# Patient Record
Sex: Female | Born: 1955 | Hispanic: No | Marital: Married | Smoking: Never smoker
Health system: Southern US, Community
[De-identification: ages and names within clinical notes are randomized; demographics above are authoritative.]

## PROBLEM LIST (undated history)

## (undated) DIAGNOSIS — Z8679 Personal history of other diseases of the circulatory system: Secondary | ICD-10-CM

## (undated) DIAGNOSIS — F32A Depression, unspecified: Secondary | ICD-10-CM

## (undated) DIAGNOSIS — G3184 Mild cognitive impairment, so stated: Secondary | ICD-10-CM

## (undated) DIAGNOSIS — F419 Anxiety disorder, unspecified: Secondary | ICD-10-CM

## (undated) DIAGNOSIS — Z9889 Other specified postprocedural states: Secondary | ICD-10-CM

## (undated) HISTORY — PX: KNEE SURGERY: SHX244

## (undated) HISTORY — PX: ABDOMINAL HYSTERECTOMY: SHX81

---

## 2000-11-10 ENCOUNTER — Encounter: Payer: Self-pay | Admitting: Neurosurgery

## 2000-11-10 ENCOUNTER — Observation Stay (HOSPITAL_COMMUNITY): Admission: RE | Admit: 2000-11-10 | Discharge: 2000-11-11 | Payer: Self-pay | Admitting: Neurosurgery

## 2000-12-04 ENCOUNTER — Encounter: Admission: RE | Admit: 2000-12-04 | Discharge: 2000-12-04 | Payer: Self-pay | Admitting: Neurosurgery

## 2000-12-04 ENCOUNTER — Encounter: Payer: Self-pay | Admitting: Neurosurgery

## 2004-09-07 ENCOUNTER — Ambulatory Visit: Payer: Self-pay | Admitting: Family Medicine

## 2004-09-07 IMAGING — MG UNKNOWN MG STUDY
1 series · 4 of 4 positions shown · non-contrast
Comparison: none

REASON FOR EXAM: Screening
COMMENTS:

[R CC · right · 4 of 4 slices shown]
[im 1/4]
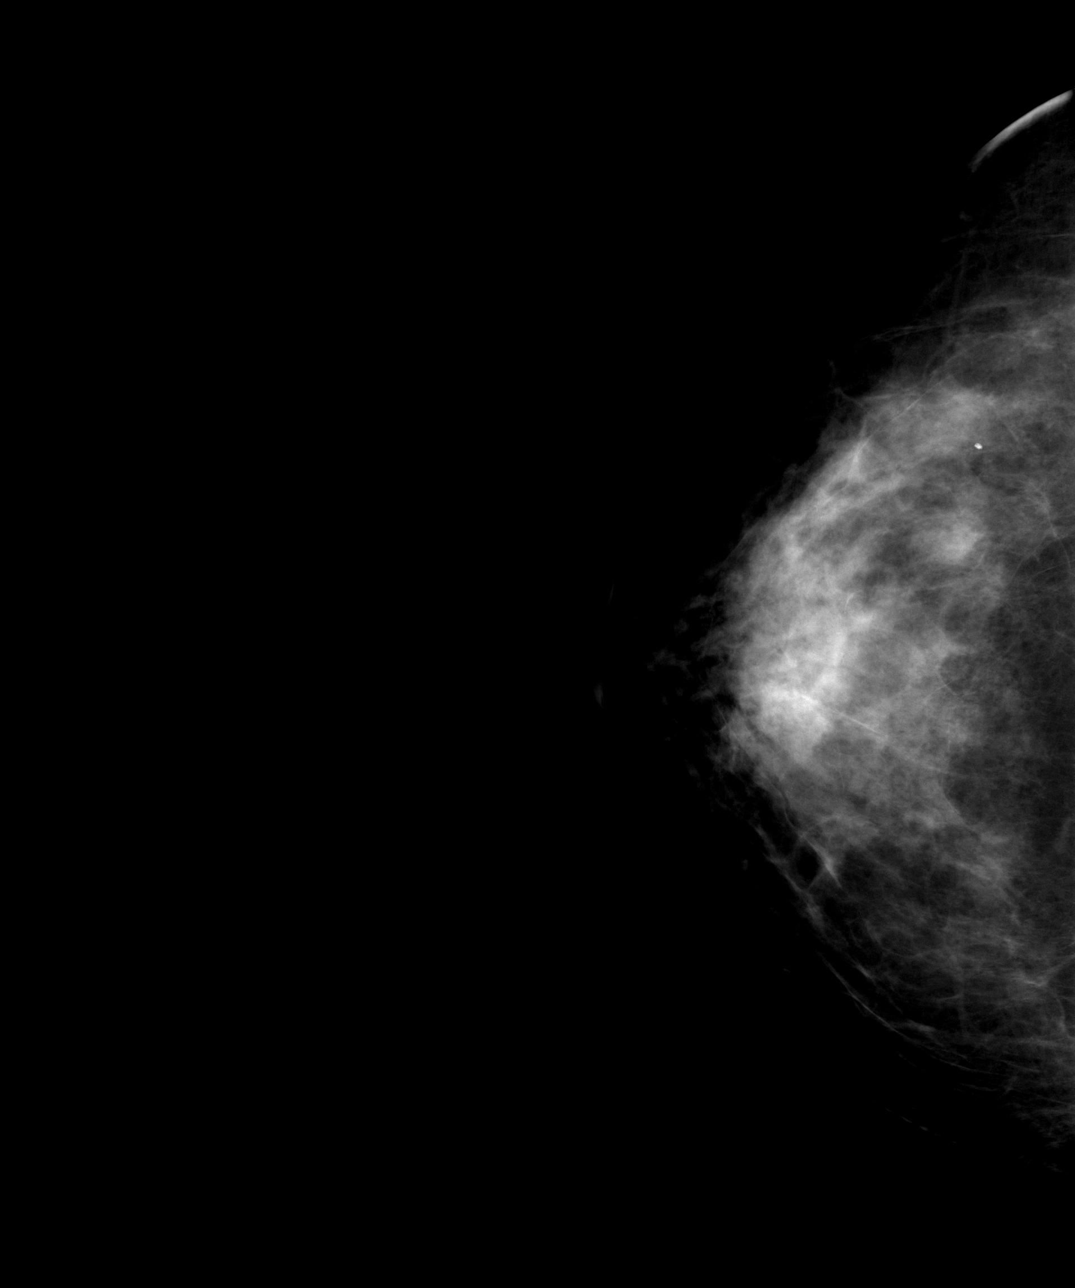
[im 2/4]
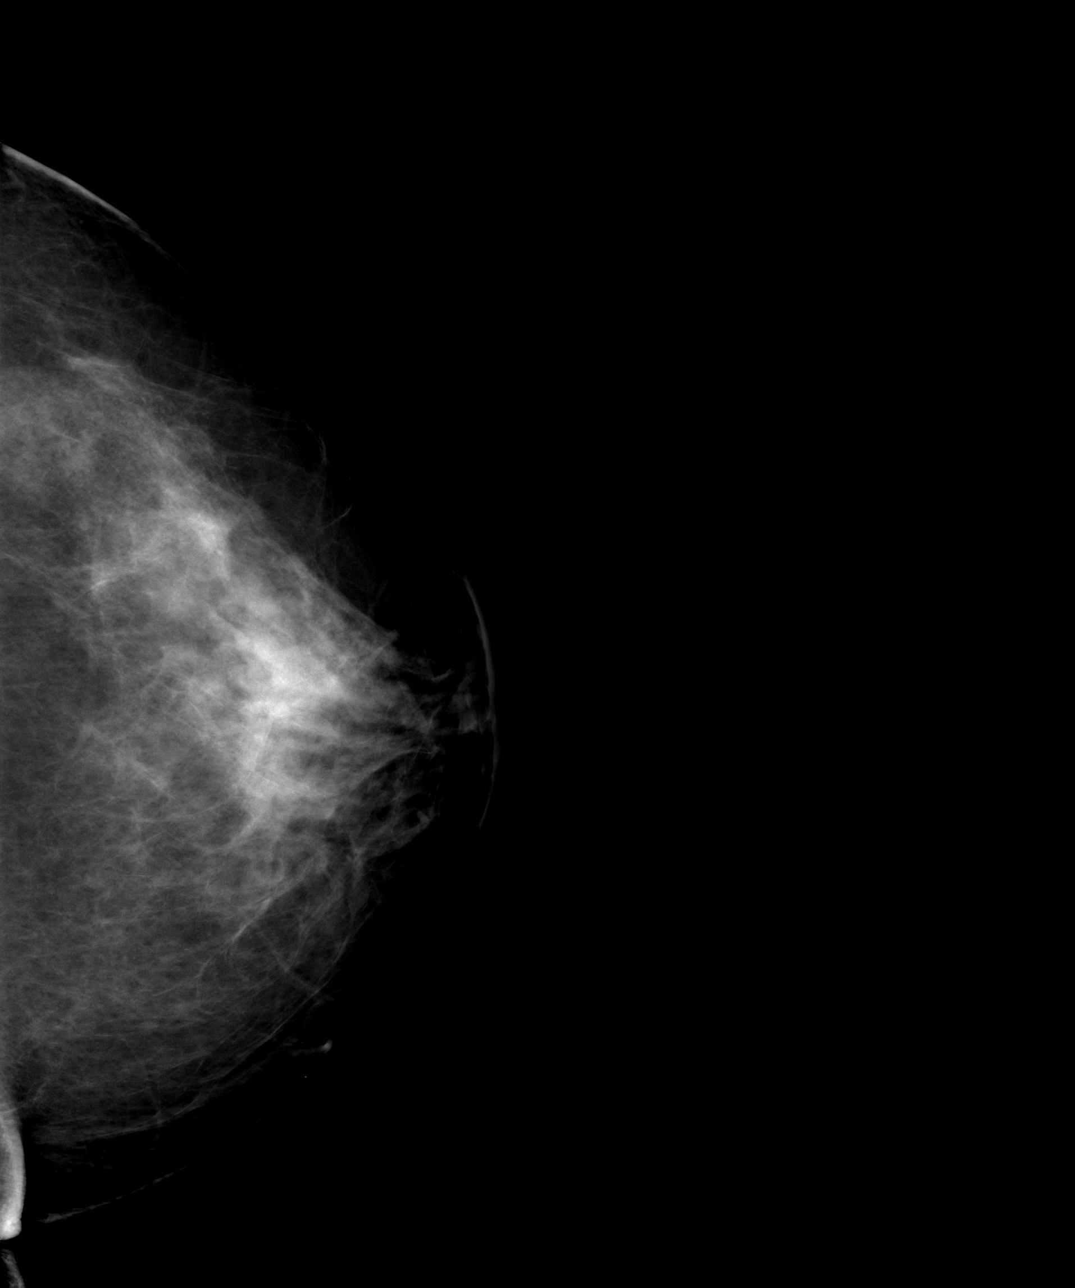
[im 3/4]
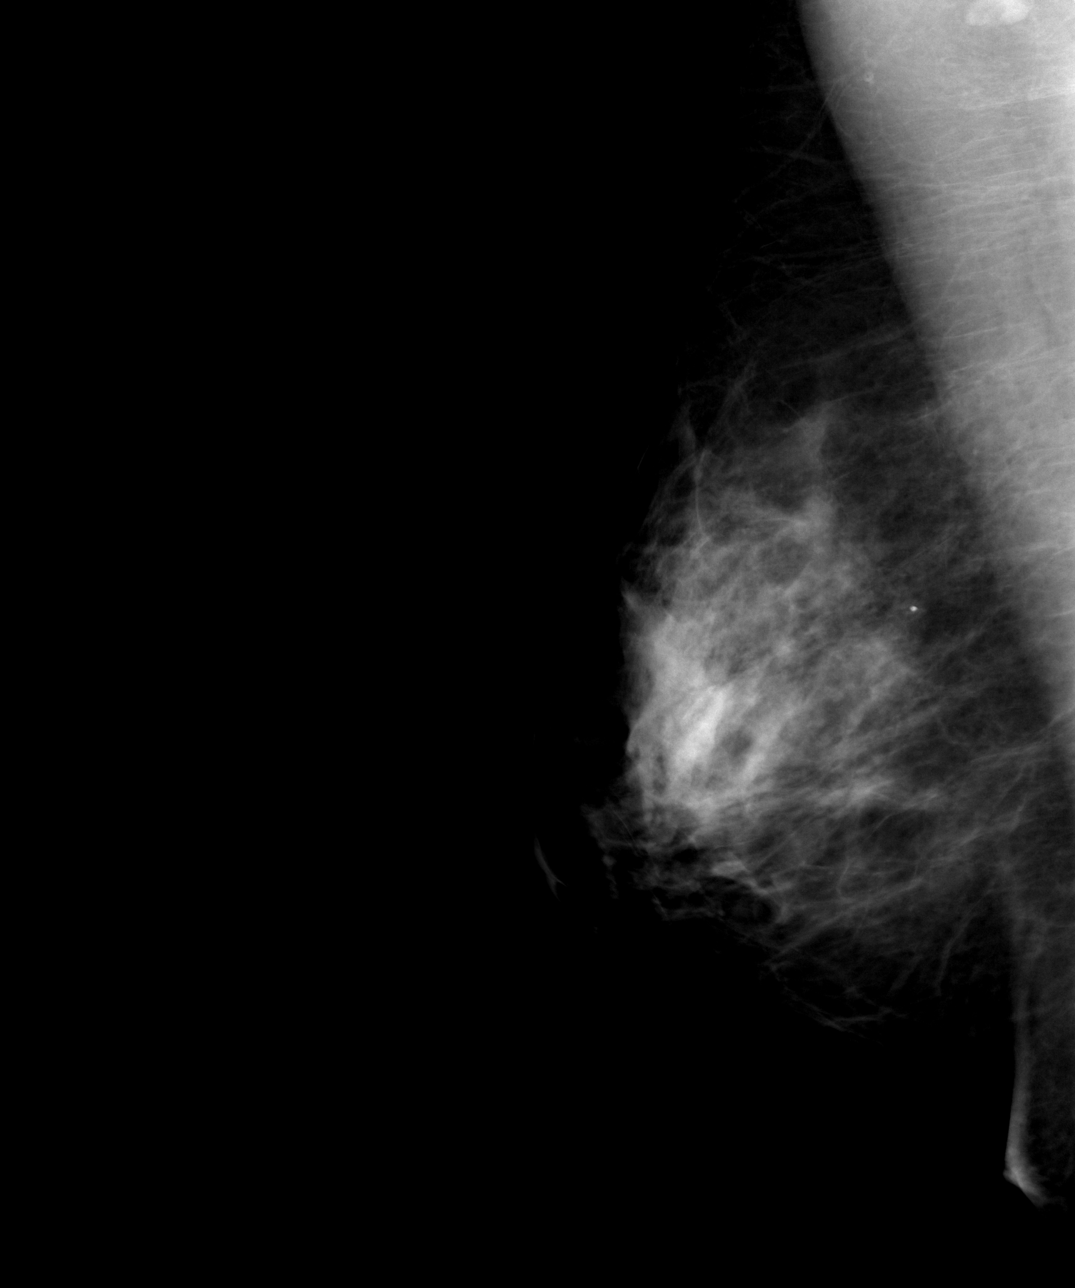
[im 4/4]
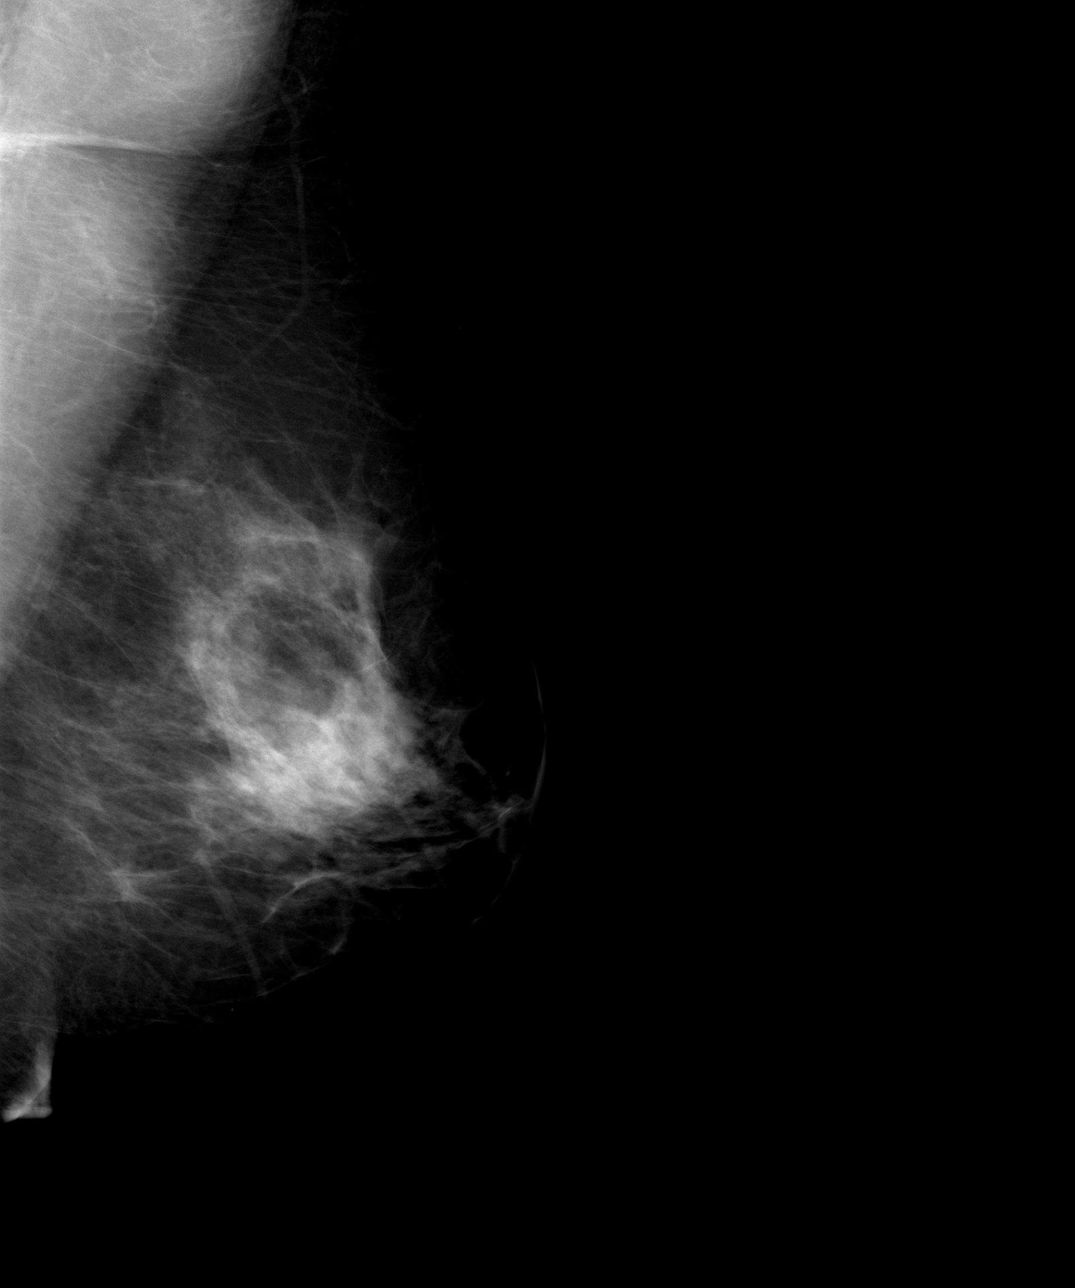

[4 of 4 positions shown; findings below may reference images not displayed]

PROCEDURE:     MAM - MAM DGTL SCREENING MAMMO W/CAD  - [DATE]  [DATE]

RESULT:     She is 48 with a strong family but no personal history of breast
cancer.  She does not take hormones or have complaints today.

The breasts are a mixture of dense fibroglandular tissue and fatty
replacement with the overall parenchymal pattern remaining stable.  There is
no evidence of a dominant stellate mass, architectural distortion, or
microcalcification to suspect malignancy.
IMPRESSION: Stable mammogram.  No change since prior studies.  The patient should return
for a bilateral screening mammogram in one year.

BI-RADS: Category 2-Benign Finding.

A NEGATIVE MAMMOGRAM REPORT DOES NOT PRECLUDE BIOPSY OR OTHER EVALUATION OF
A CLINICALLY PALPABLE OR OTHERWISE SUSPICIOUS MASS OR LESION.  BREAST CANCER
MAY NOT BE DETECTED BY MAMMOGRAPHY IN UP TO 10% OF CASES.

## 2006-02-23 ENCOUNTER — Ambulatory Visit: Payer: Self-pay | Admitting: Family Medicine

## 2007-08-09 ENCOUNTER — Ambulatory Visit: Payer: Self-pay | Admitting: Family Medicine

## 2007-08-16 ENCOUNTER — Ambulatory Visit: Payer: Self-pay | Admitting: Family Medicine

## 2007-08-16 IMAGING — MG MAM DGTL SCREENING MAMMO W/CAD
1 series · 4 of 4 positions shown · non-contrast
Comparison: none

REASON FOR EXAM: Screening mammogram
COMMENTS:

PROCEDURE:     MAM - MAM DGTL SCREENING MAMMO W/CAD  - [DATE]  [DATE]
RESULT:     Comparison is made to a prior exam of [DATE].

[R CC · right · 4 of 4 slices shown]
[im 1/4]
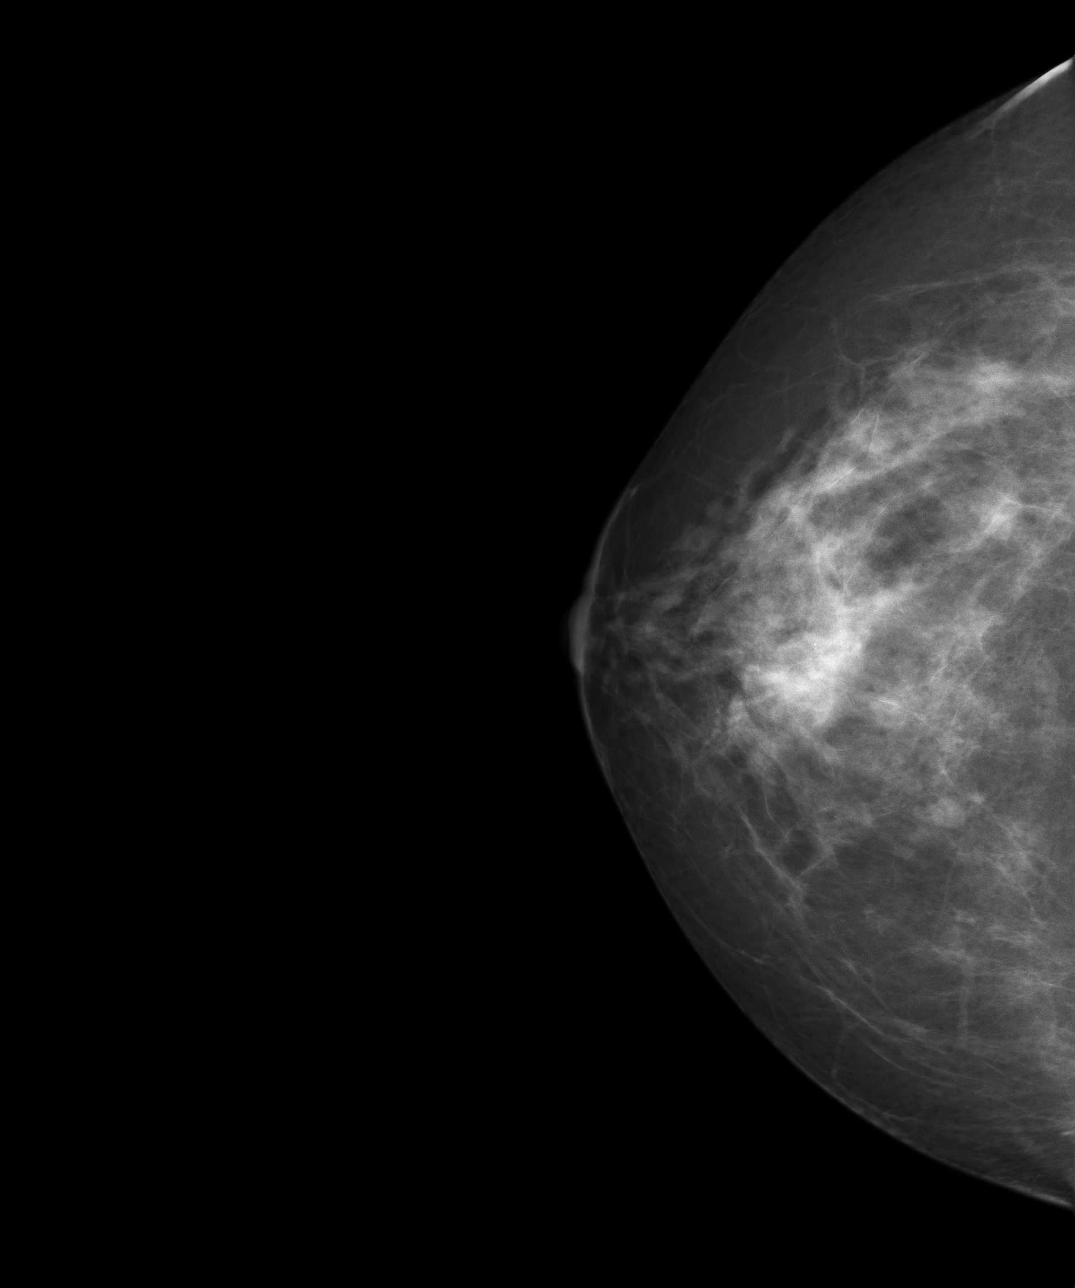
[im 2/4]
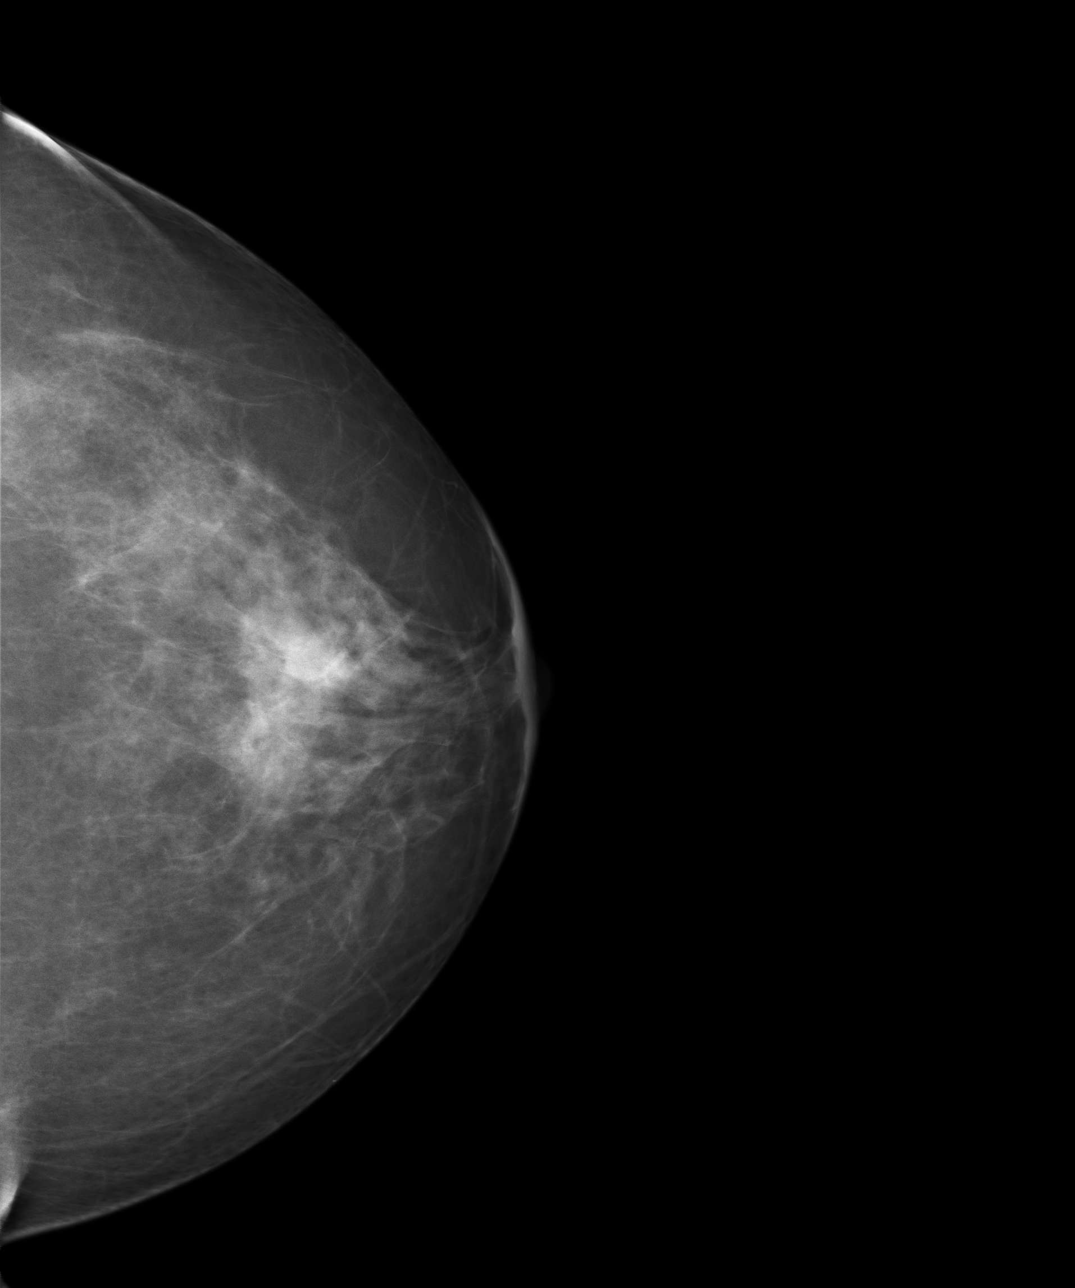
[im 3/4]
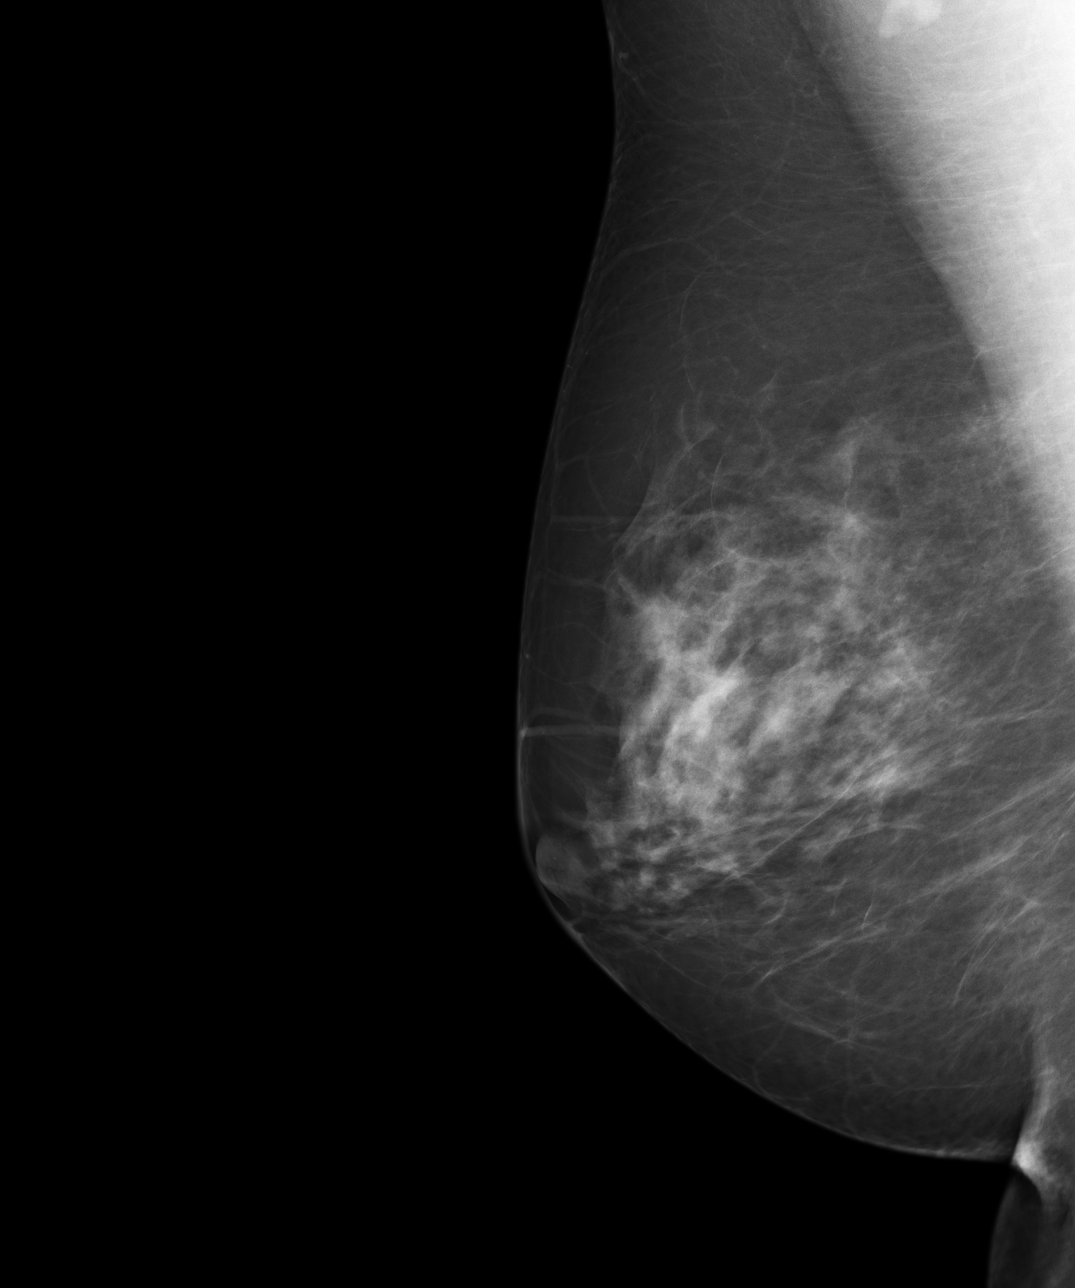
[im 4/4]
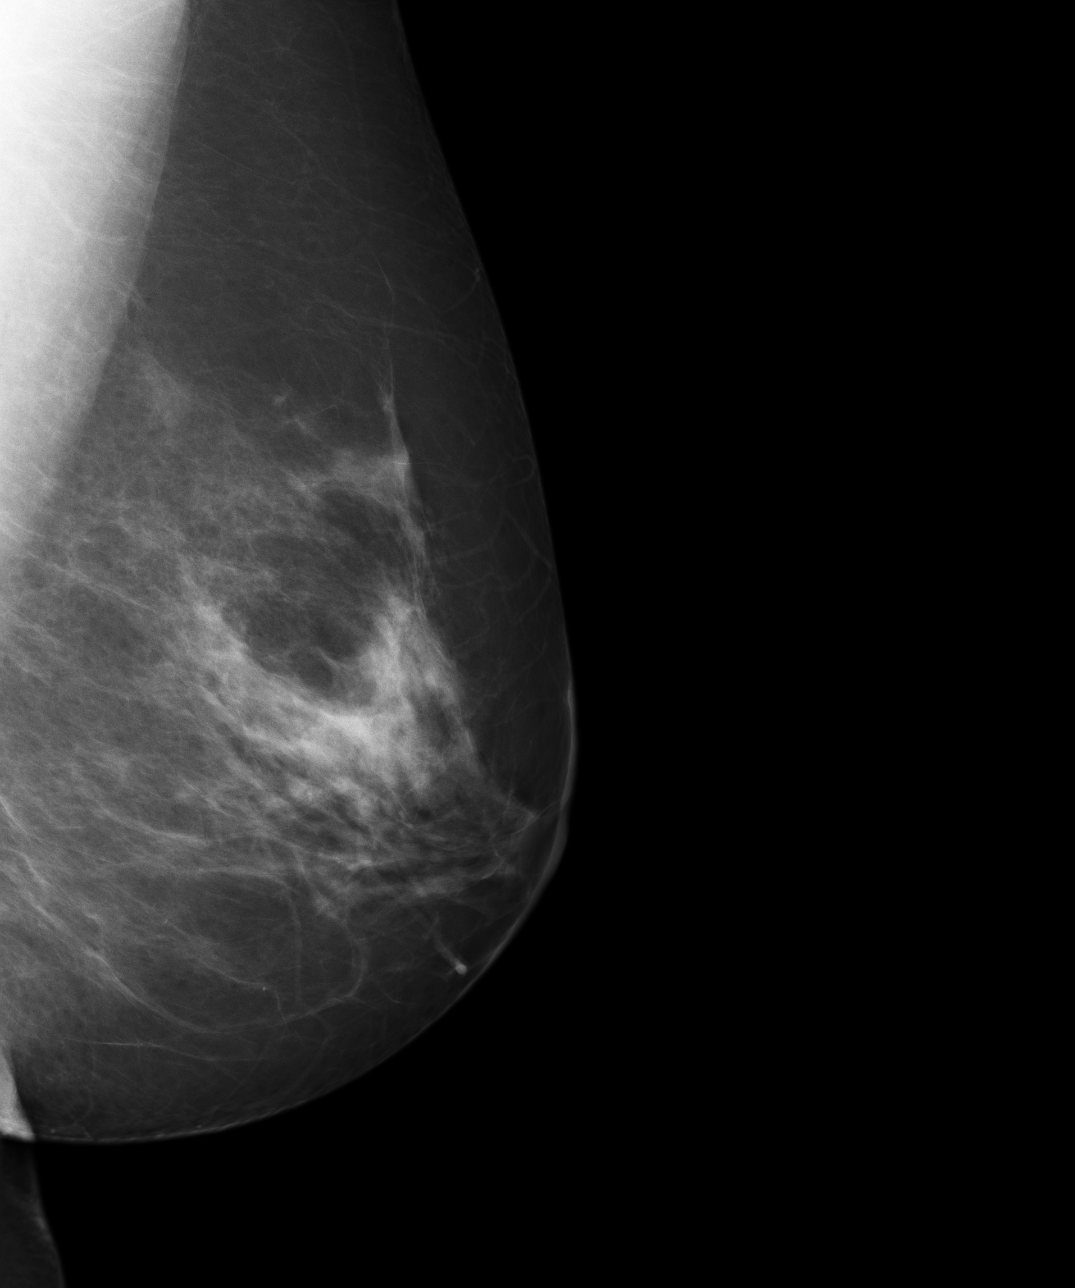

[4 of 4 positions shown; findings below may reference images not displayed]

FINDINGS: Heterogeneously dense bilateral breast parenchyma lowers
sensitivity for mammography. No new suspicious mass or suspicious
calcifications.
IMPRESSION: 1.     Negative bilateral mammogram.
2.     Recommend routine follow-up mammogram in one year.
3.     BI-RADS: Category 1  Negative.

Thank you for this opportunity to contribute to the care of your patient.

A NEGATIVE MAMMOGRAM REPORT DOES NOT PRECLUDE BIOPSY OR OTHER EVALUATION OF
A CLINICALLY PALPABLE OR OTHERWISE SUSPICIOUS MASS OR LESION. BREAST CANCER
MAY NOT BE DETECTED BY MAMMOGRAPHY IN UP TO 10% OF CASES.

## 2008-09-18 ENCOUNTER — Ambulatory Visit: Payer: Self-pay | Admitting: Family Medicine

## 2008-09-18 IMAGING — MG MAM DGTL SCREENING MAMMO W/CAD
1 series · 4 of 4 positions shown · non-contrast
Comparison: none

REASON FOR EXAM: scr mammo
COMMENTS:

[R CC · right · 4 of 4 slices shown]
[im 1/4]
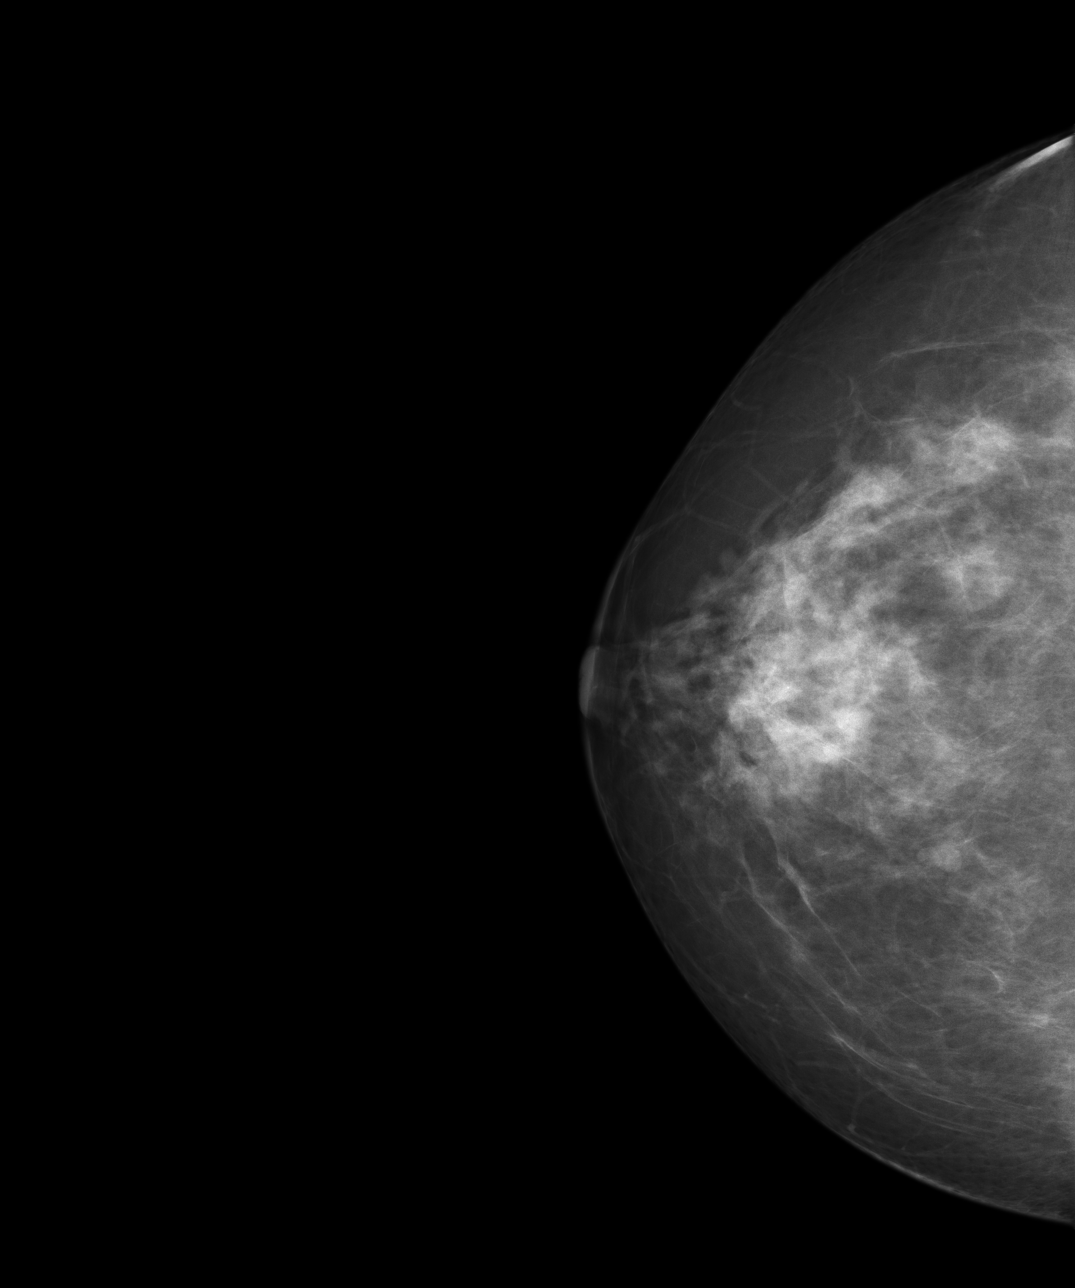
[im 2/4]
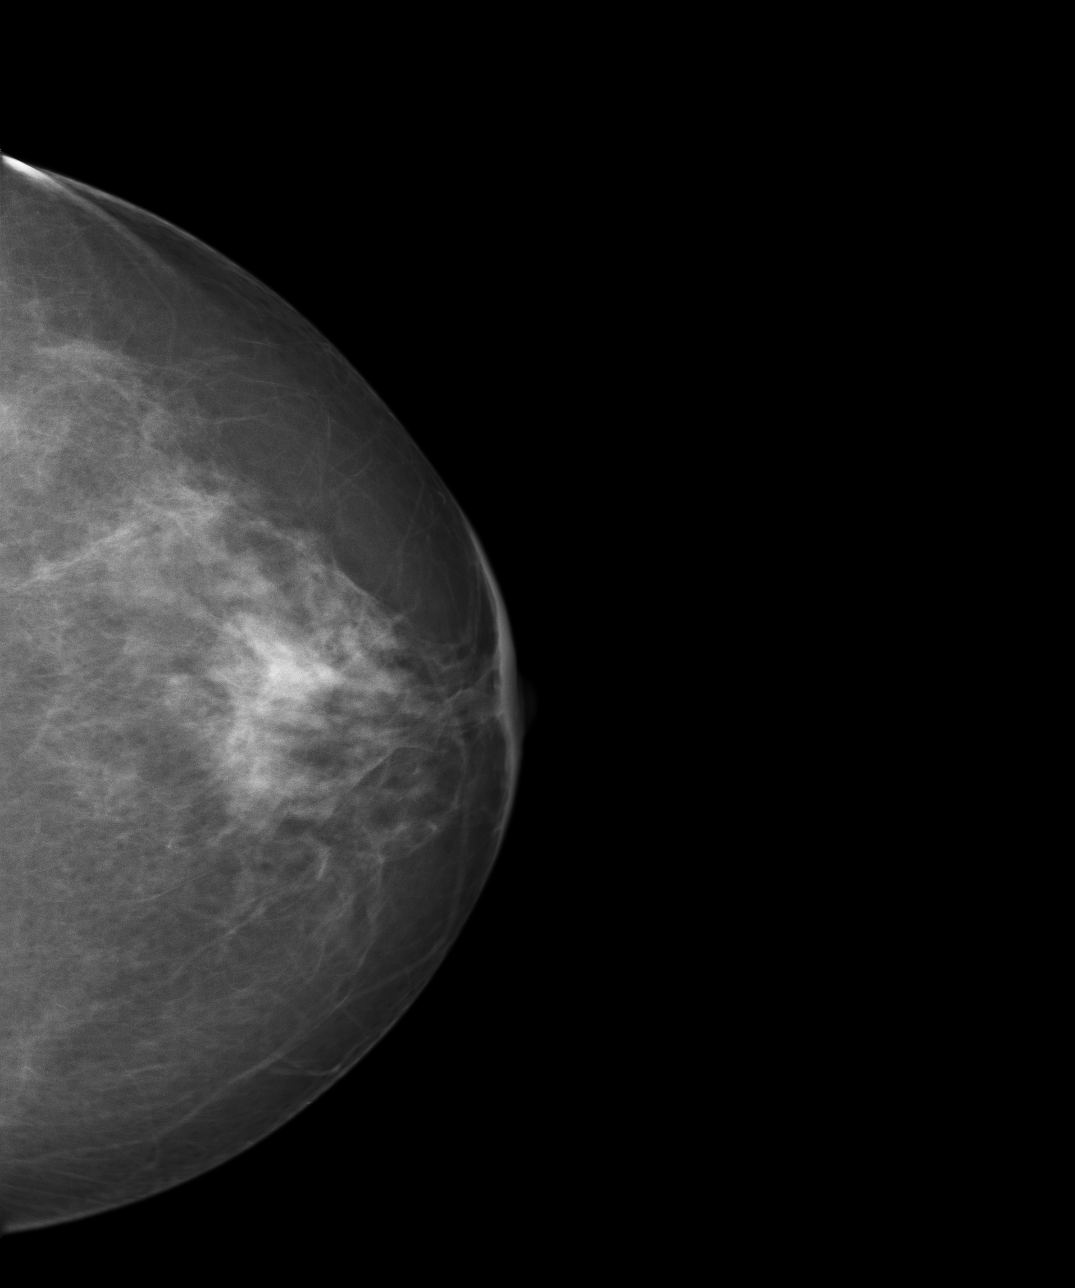
[im 3/4]
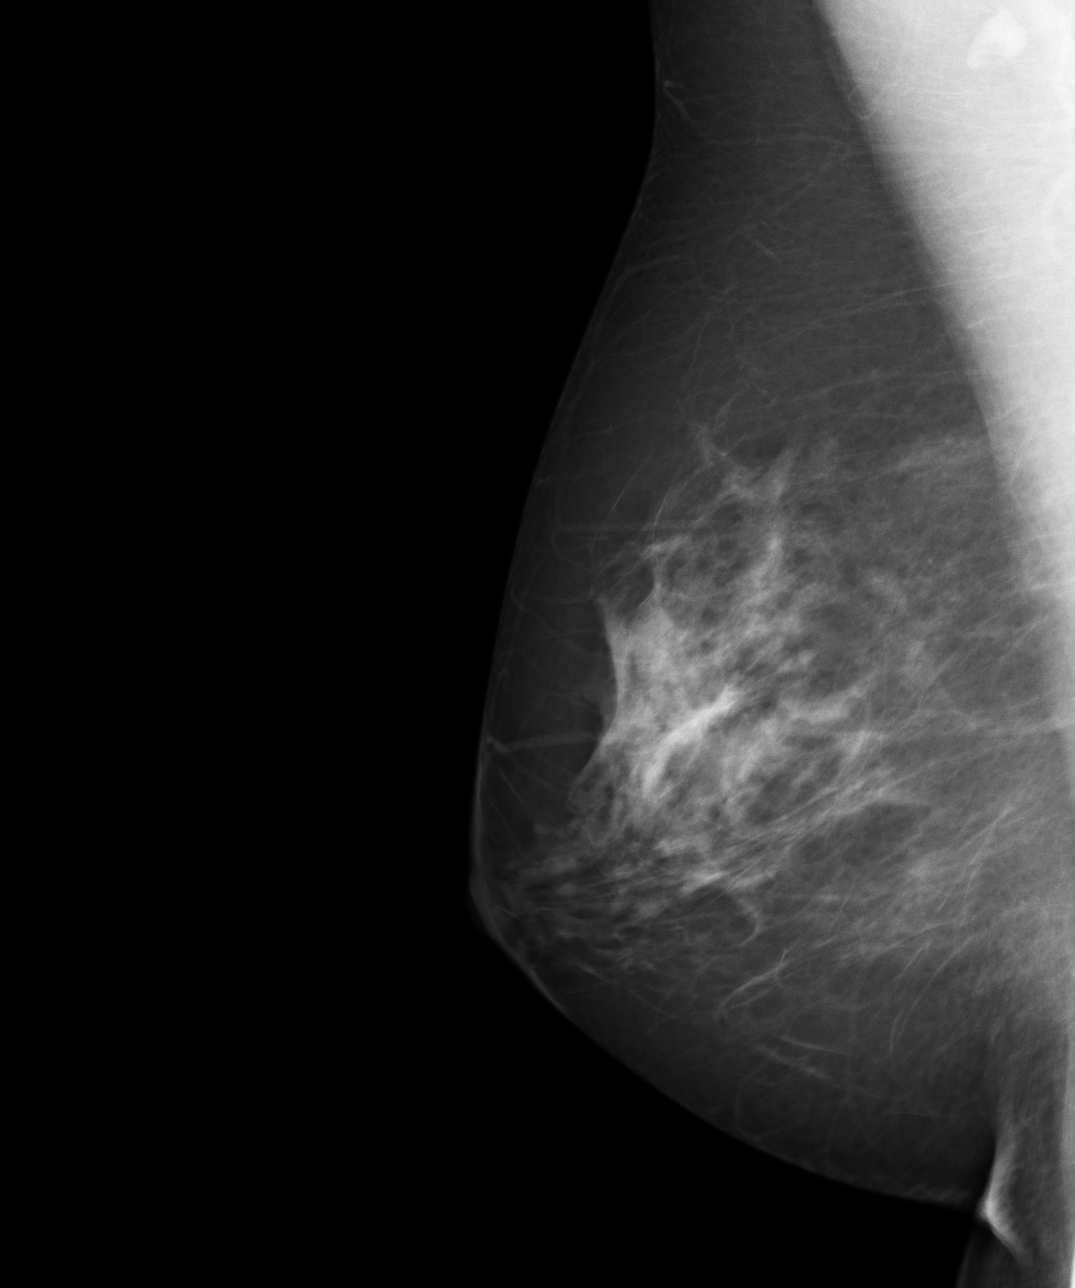
[im 4/4]
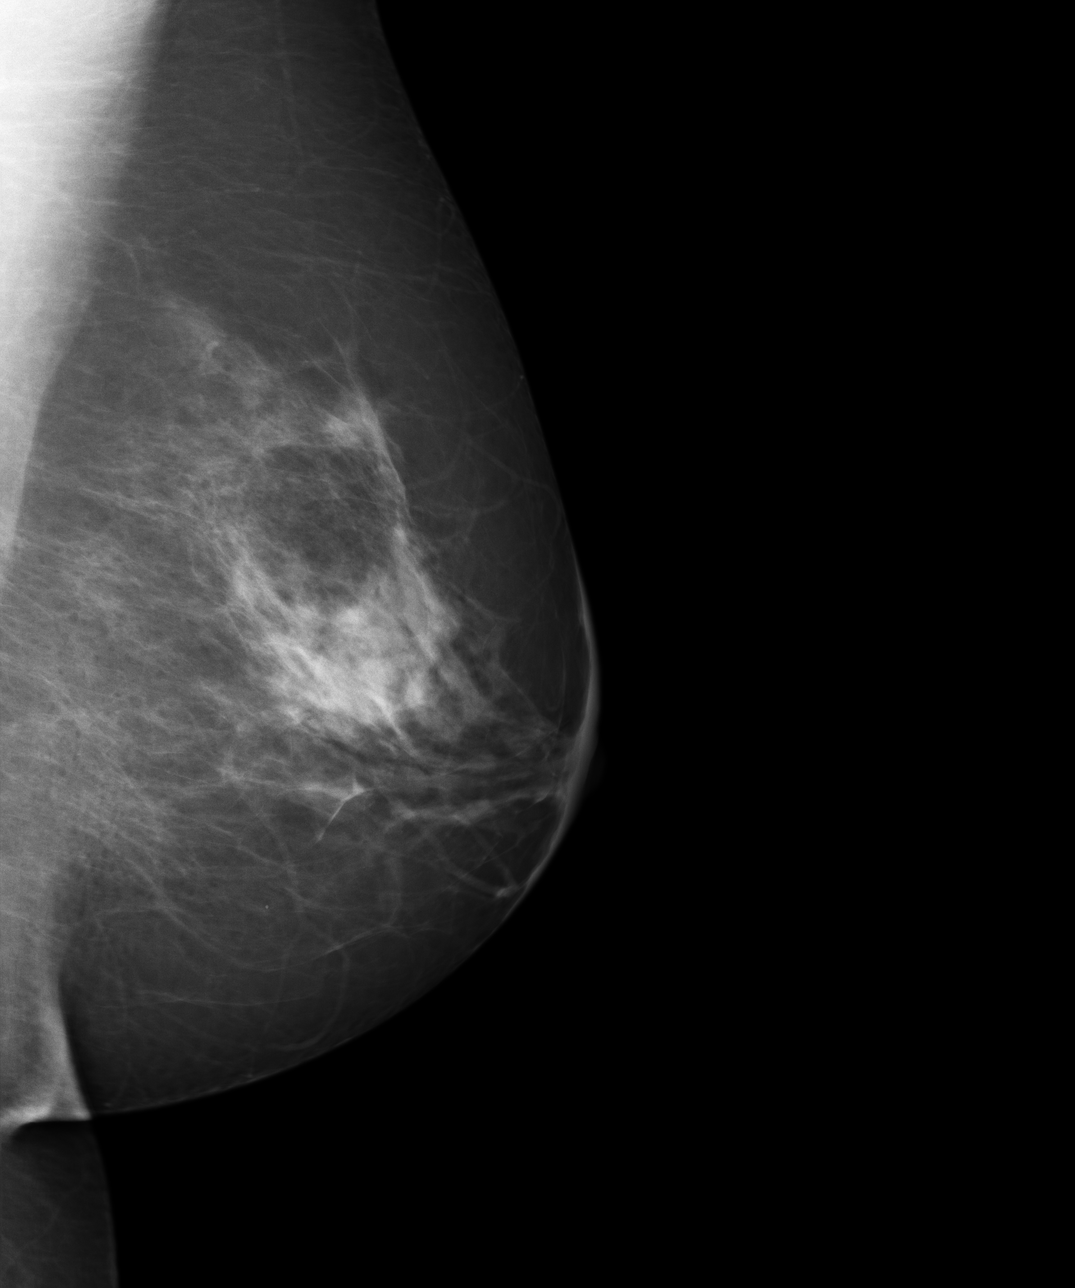

[4 of 4 positions shown; findings below may reference images not displayed]

PROCEDURE:     MAM - MAM DGTL SCREENING MAMMO W/CAD  - [DATE]  [DATE]

RESULT:        Comparison is  made to a prior digital study dated [DATE] as well as [DATE] and to a film screen study [DATE].

The breasts exhibit a moderately dense nodular parenchymal pattern.  There
is no dominant mass. There are no malignant-appearing groupings of
microcalcification.
IMPRESSION: 1.      I do not see findings suspicious for malignancy.
2.      BI-RADS:  Category 2- Benign Finding.

RECOMMENDATION:  Please continue to encourage yearly mammographic follow up.

A negative mammogram report does not preclude biopsy or other evaluation of
a clinically palpable or otherwise suspicious mass or lesion.  Breast cancer
may not be detected by mammography in up to 10% of cases.

## 2009-10-02 ENCOUNTER — Ambulatory Visit: Payer: Self-pay | Admitting: Family Medicine

## 2009-10-02 IMAGING — MG MAM DGTL SCREENING MAMMO W/CAD
1 series · 4 of 4 positions shown · non-contrast
Comparison: none

REASON FOR EXAM: SCR
COMMENTS:

[Series 537: R CC · right · 4 of 4 slices shown]
[im 1/4]
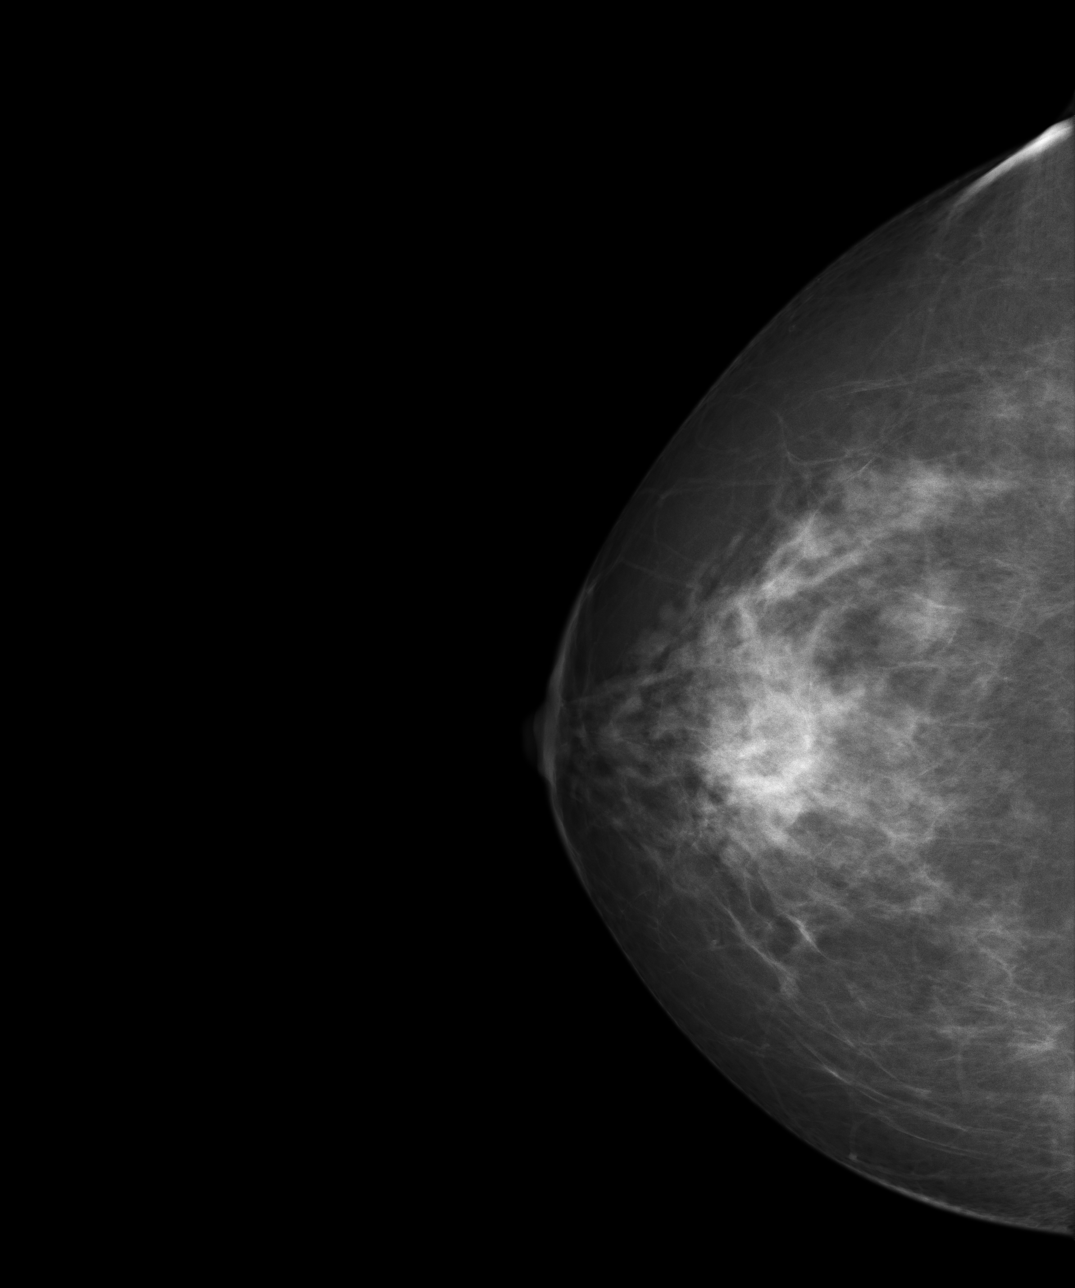
[im 2/4]
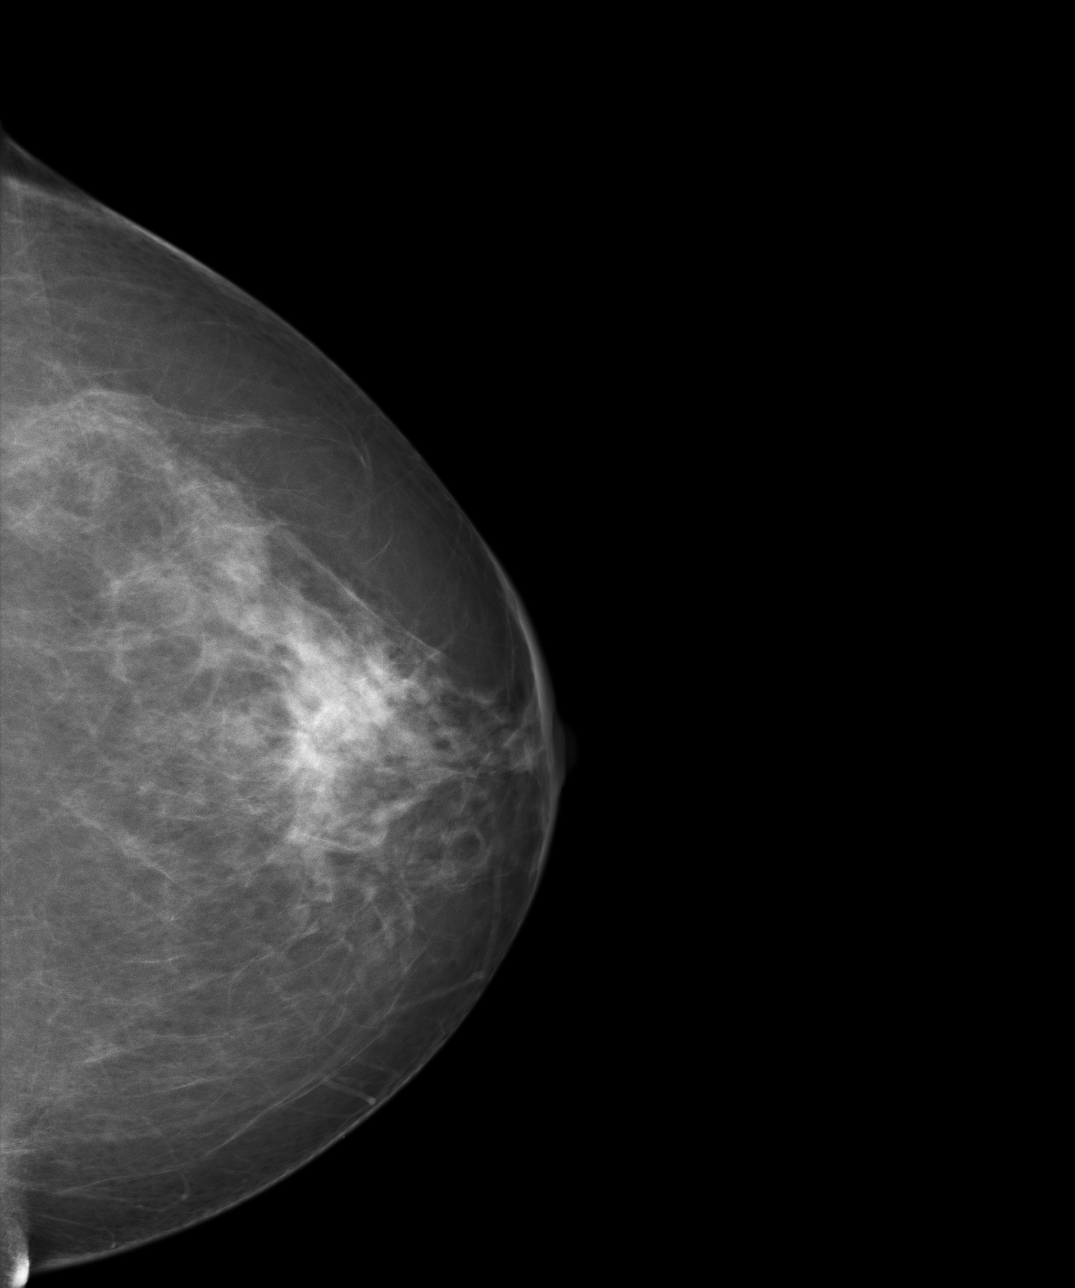
[im 3/4]
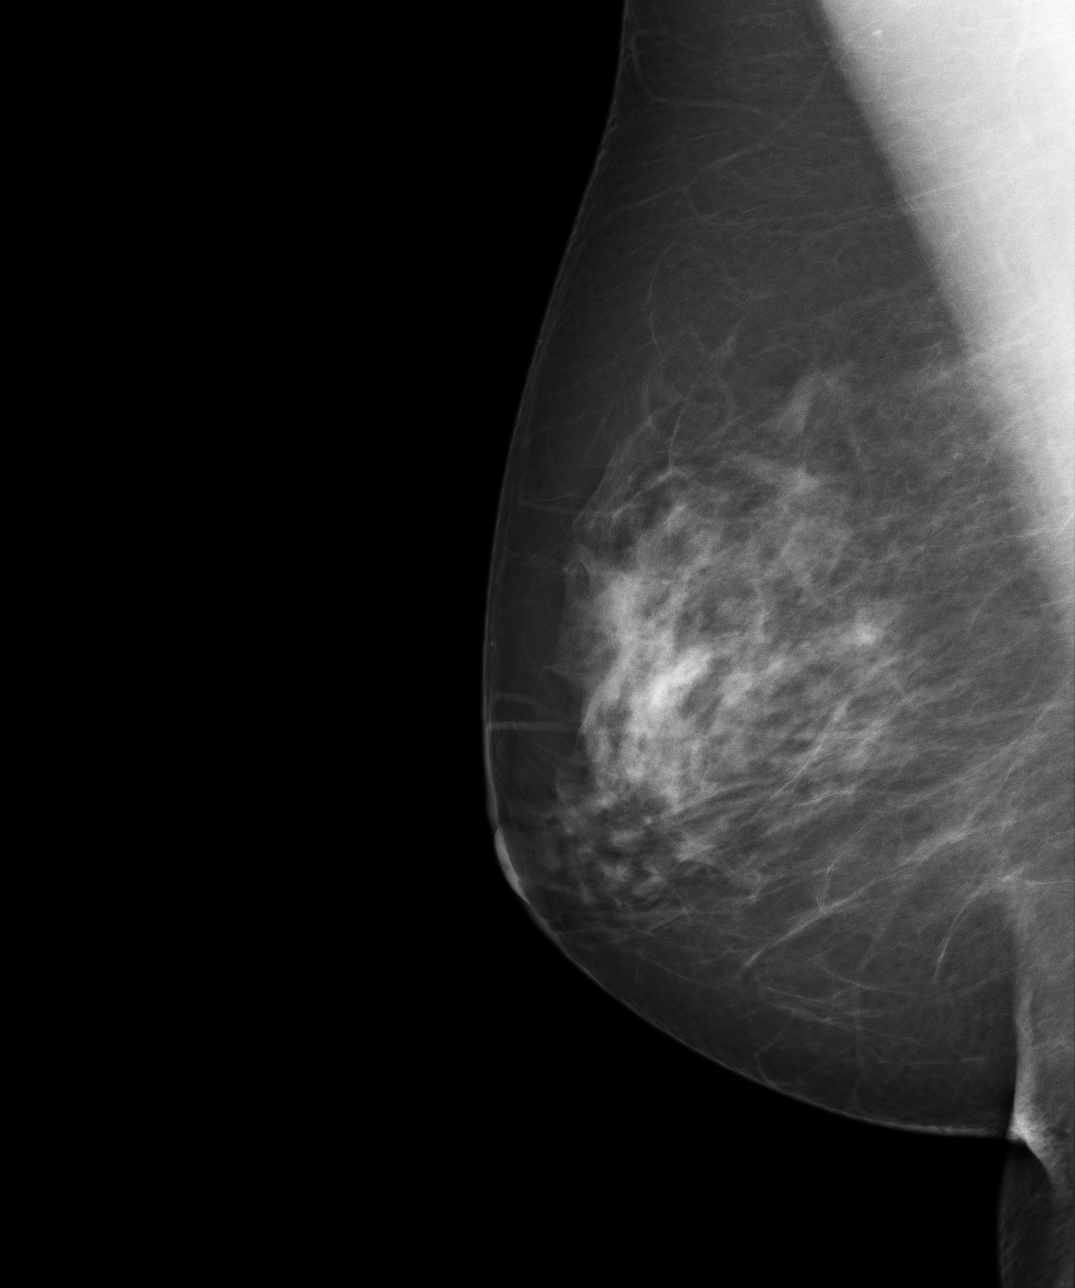
[im 4/4]
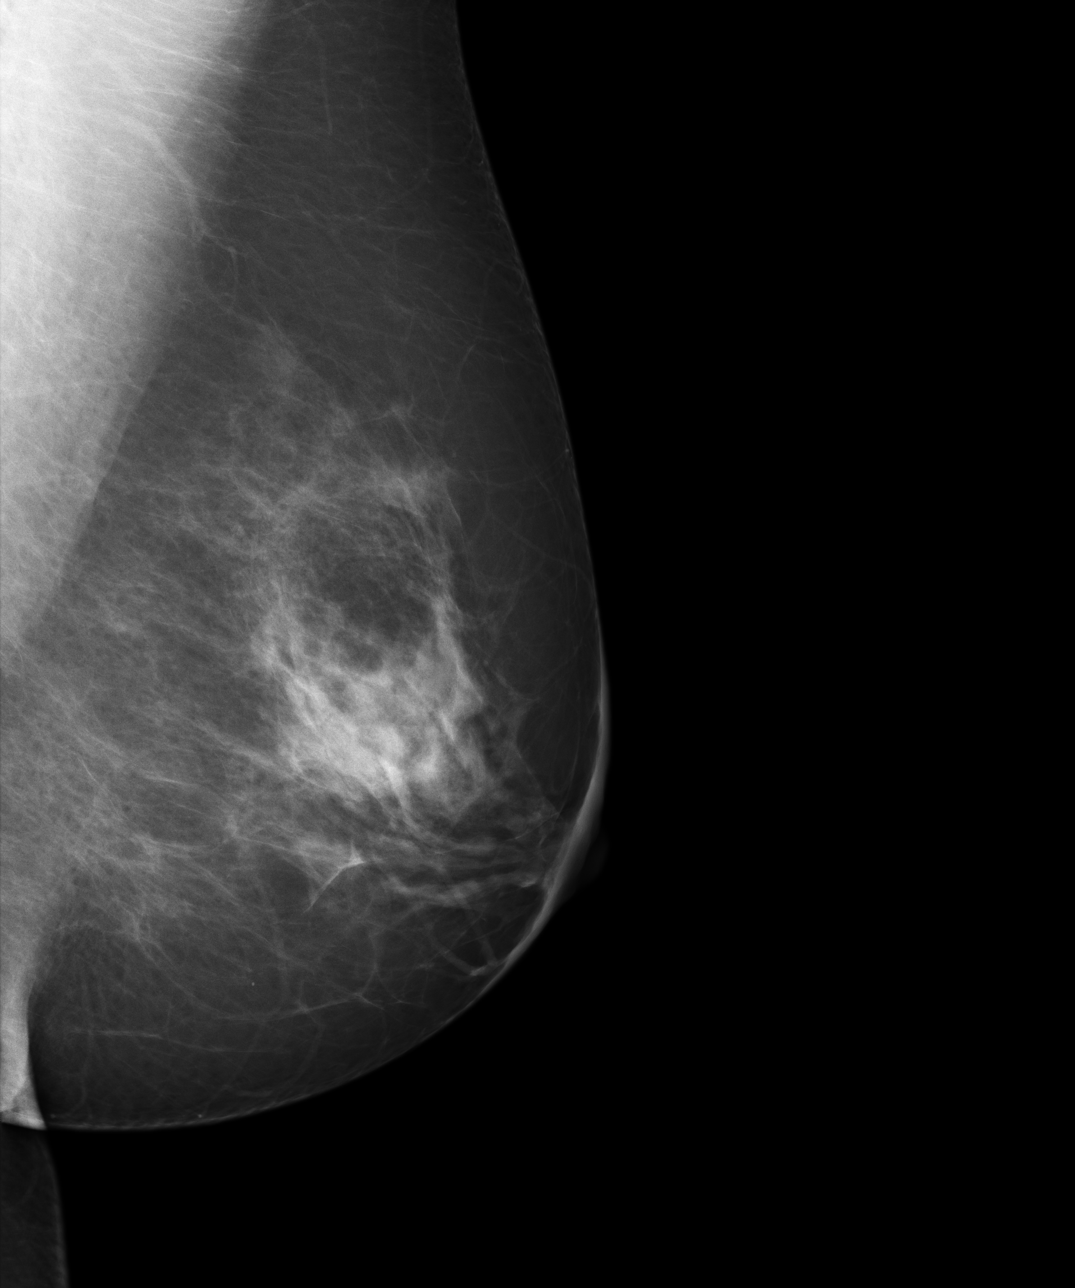

[4 of 4 positions shown; findings below may reference images not displayed]

PROCEDURE:     MAM - MAM DGTL SCREENING MAMMO W/CAD  - [DATE] [DATE]

RESULT:       Comparison is made to analog images dated [DATE] and to
digital images dated [DATE] as well as [DATE] and [DATE].

The breasts exhibit a moderately dense parenchymal pattern. There is no
developing density, dominant mass or malignant-appearing calcification.
There is no architectural distortion.  The appearance is stable.
IMPRESSION: 1.     Stable benign-appearing bilateral mammogram.
2.     BI-RADS:  Category 2- Benign Finding.
3.     Please continue to encourage annual mammographic follow up.

A negative mammogram report does not preclude biopsy or other evaluation of
a clinically palpable or otherwise suspicious mass or lesion. Breast cancer
may not be detected by mammography in up to 10% of cases.

## 2016-04-04 DIAGNOSIS — M25552 Pain in left hip: Secondary | ICD-10-CM

## 2016-04-04 DIAGNOSIS — M25551 Pain in right hip: Secondary | ICD-10-CM | POA: Insufficient documentation

## 2016-04-04 DIAGNOSIS — G4720 Circadian rhythm sleep disorder, unspecified type: Secondary | ICD-10-CM | POA: Insufficient documentation

## 2016-04-04 DIAGNOSIS — F411 Generalized anxiety disorder: Secondary | ICD-10-CM | POA: Insufficient documentation

## 2016-04-05 ENCOUNTER — Other Ambulatory Visit: Payer: Self-pay | Admitting: Adult Health

## 2016-04-05 DIAGNOSIS — Z1231 Encounter for screening mammogram for malignant neoplasm of breast: Secondary | ICD-10-CM

## 2016-05-12 ENCOUNTER — Ambulatory Visit: Payer: Self-pay

## 2016-06-03 ENCOUNTER — Ambulatory Visit
Admission: RE | Admit: 2016-06-03 | Discharge: 2016-06-03 | Disposition: A | Discharge status: Home / Self Care | Payer: Managed Care, Other (non HMO) | Source: Ambulatory Visit | Attending: Adult Health | Admitting: Adult Health

## 2016-06-03 ENCOUNTER — Encounter: Payer: Self-pay | Admitting: Radiology

## 2016-06-03 DIAGNOSIS — Z1231 Encounter for screening mammogram for malignant neoplasm of breast: Secondary | ICD-10-CM | POA: Insufficient documentation

## 2016-06-03 IMAGING — MG MM DIGITAL SCREENING BILAT W/ CAD
5 series · 5 of 5 positions shown · non-contrast
Comparison: Previous exam(s).

CLINICAL DATA: Screening.

EXAM:
DIGITAL SCREENING BILATERAL MAMMOGRAM WITH CAD

[L CC]
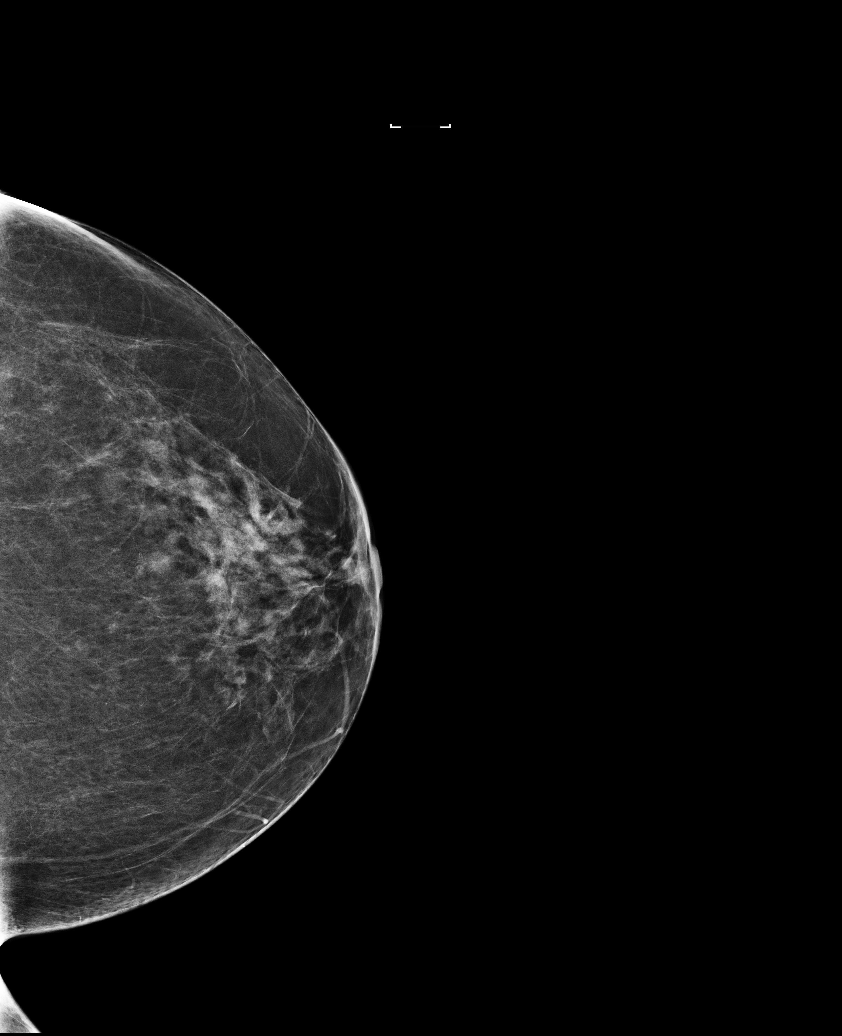

[L MLO]
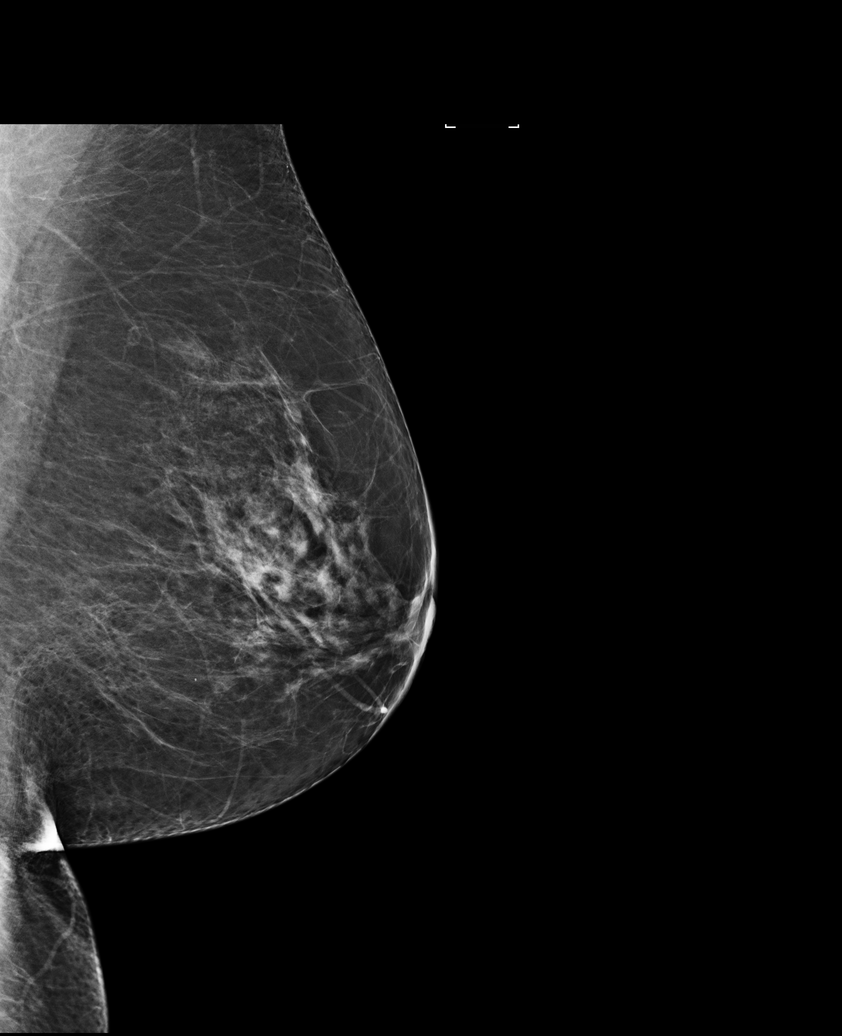

[R CC (1 of 2)]
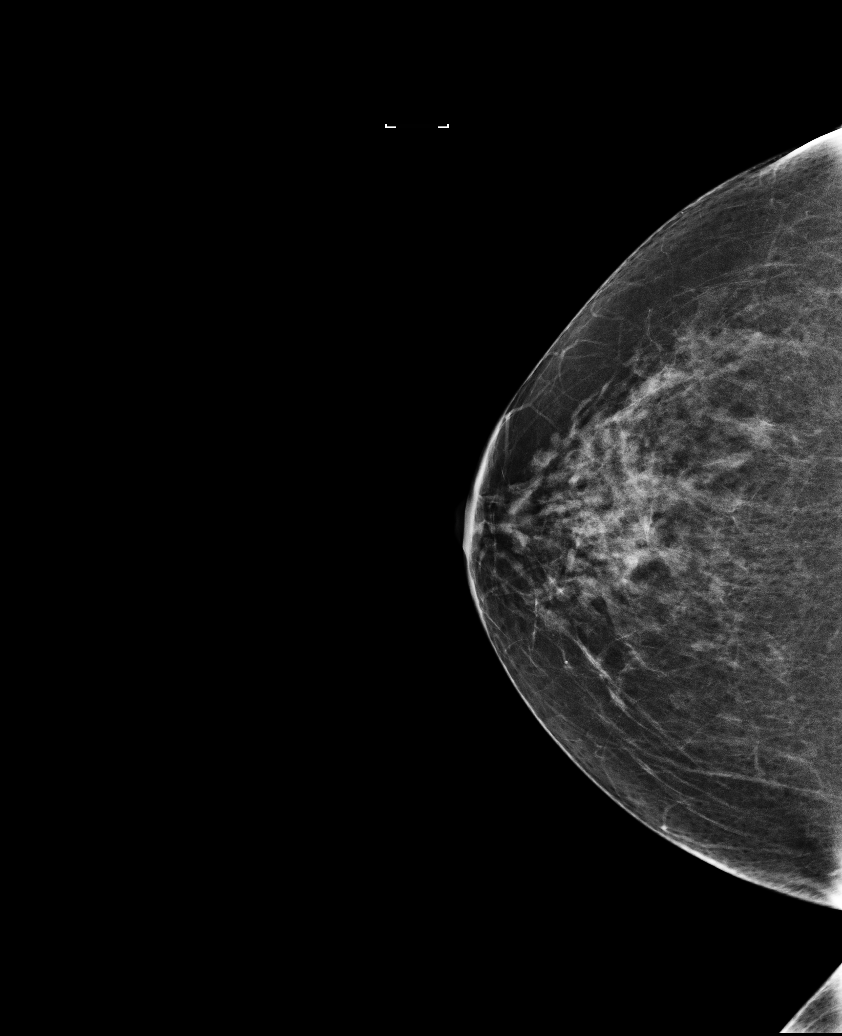

[R CC (2 of 2)]
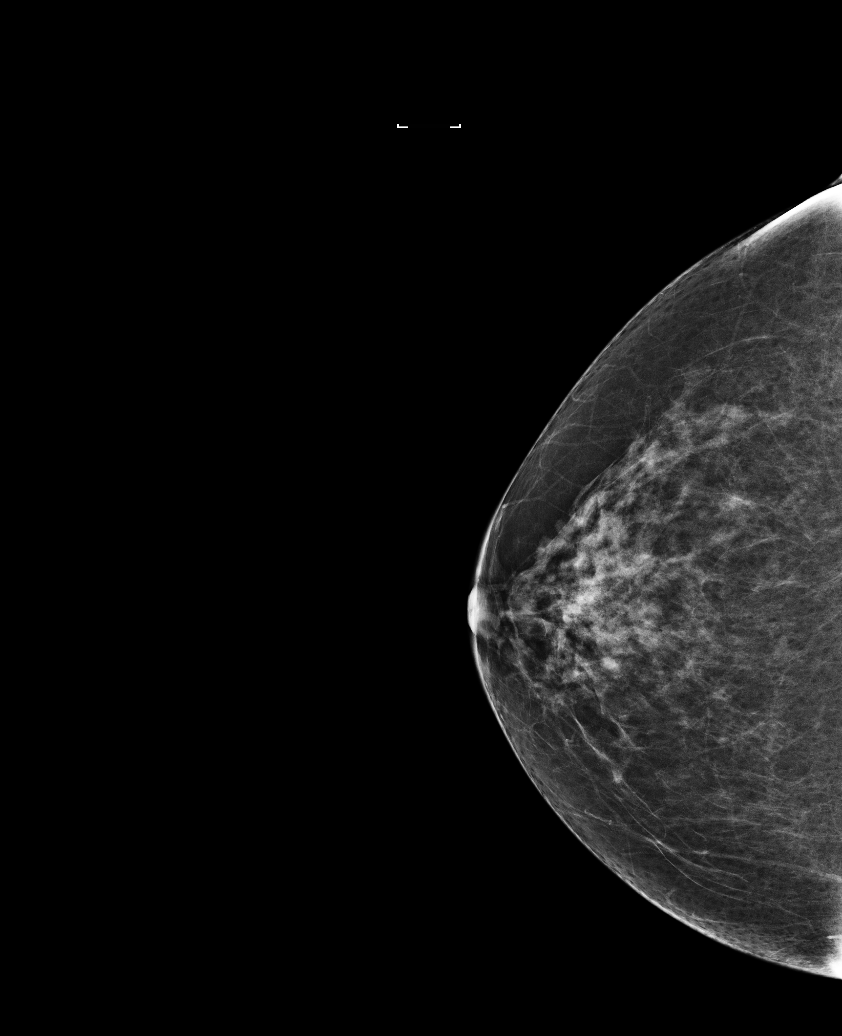

[R MLO]
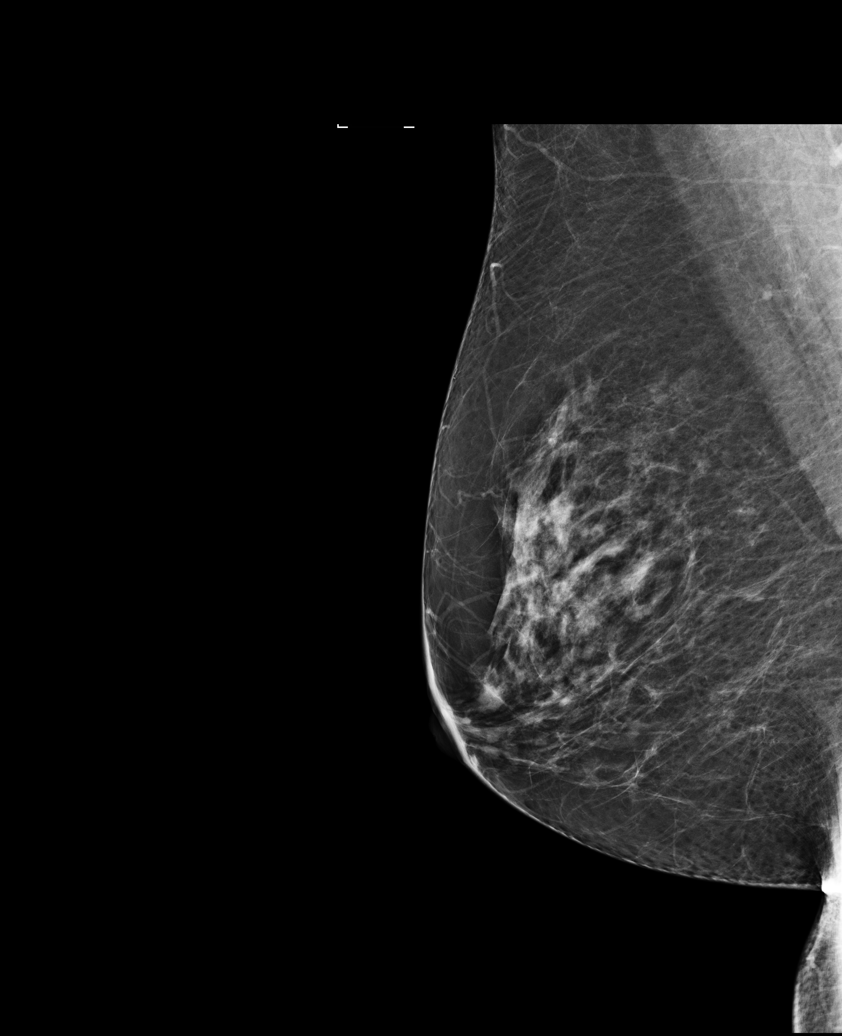

[5 of 5 positions shown; findings below may reference images not displayed]

ACR Breast Density Category b: There are scattered areas of
fibroglandular density.
FINDINGS: There are no findings suspicious for malignancy. Images were
processed with CAD.
IMPRESSION: No mammographic evidence of malignancy. A result letter of this
screening mammogram will be mailed directly to the patient.

RECOMMENDATION:
Screening mammogram in one year. (Code:[US])

BI-RADS CATEGORY  1: Negative.

## 2016-09-27 ENCOUNTER — Other Ambulatory Visit: Payer: Self-pay | Admitting: Adult Health

## 2017-04-28 ENCOUNTER — Inpatient Hospital Stay: Payer: Managed Care, Other (non HMO)

## 2017-04-28 ENCOUNTER — Other Ambulatory Visit: Payer: Self-pay

## 2017-04-28 ENCOUNTER — Emergency Department: Payer: Managed Care, Other (non HMO)

## 2017-04-28 ENCOUNTER — Inpatient Hospital Stay
Admission: EM | Admit: 2017-04-28 | Discharge: 2017-05-04 | DRG: 358 | Disposition: A | Discharge status: Home / Self Care | Payer: Managed Care, Other (non HMO) | Attending: Surgery | Admitting: Surgery

## 2017-04-28 ENCOUNTER — Encounter: Payer: Self-pay | Admitting: Emergency Medicine

## 2017-04-28 DIAGNOSIS — Z801 Family history of malignant neoplasm of trachea, bronchus and lung: Secondary | ICD-10-CM

## 2017-04-28 DIAGNOSIS — N739 Female pelvic inflammatory disease, unspecified: Secondary | ICD-10-CM | POA: Diagnosis present

## 2017-04-28 DIAGNOSIS — Z79899 Other long term (current) drug therapy: Secondary | ICD-10-CM | POA: Diagnosis not present

## 2017-04-28 DIAGNOSIS — Z90710 Acquired absence of both cervix and uterus: Secondary | ICD-10-CM | POA: Diagnosis not present

## 2017-04-28 DIAGNOSIS — K5780 Diverticulitis of intestine, part unspecified, with perforation and abscess without bleeding: Secondary | ICD-10-CM

## 2017-04-28 DIAGNOSIS — K5792 Diverticulitis of intestine, part unspecified, without perforation or abscess without bleeding: Secondary | ICD-10-CM | POA: Diagnosis not present

## 2017-04-28 DIAGNOSIS — K5909 Other constipation: Secondary | ICD-10-CM | POA: Diagnosis present

## 2017-04-28 DIAGNOSIS — Z87440 Personal history of urinary (tract) infections: Secondary | ICD-10-CM | POA: Diagnosis not present

## 2017-04-28 DIAGNOSIS — Z803 Family history of malignant neoplasm of breast: Secondary | ICD-10-CM

## 2017-04-28 DIAGNOSIS — K5720 Diverticulitis of large intestine with perforation and abscess without bleeding: Secondary | ICD-10-CM | POA: Diagnosis present

## 2017-04-28 LAB — URINALYSIS, COMPLETE (UACMP) WITH MICROSCOPIC
Bacteria, UA: NONE SEEN
GLUCOSE, UA: NEGATIVE mg/dL
Hgb urine dipstick: NEGATIVE
Ketones, ur: 5 mg/dL — AB
Leukocytes, UA: NEGATIVE
NITRITE: NEGATIVE
PH: 5 (ref 5.0–8.0)
Protein, ur: 100 mg/dL — AB
SPECIFIC GRAVITY, URINE: 1.039 — AB (ref 1.005–1.030)

## 2017-04-28 LAB — CBC WITH DIFFERENTIAL/PLATELET
BASOS ABS: 0.1 10*3/uL (ref 0–0.1)
Basophils Relative: 1 %
EOS PCT: 1 %
Eosinophils Absolute: 0.1 10*3/uL (ref 0–0.7)
HCT: 43.9 % (ref 35.0–47.0)
Hemoglobin: 14.1 g/dL (ref 12.0–16.0)
LYMPHS PCT: 10 %
Lymphs Abs: 1.9 10*3/uL (ref 1.0–3.6)
MCH: 27.7 pg (ref 26.0–34.0)
MCHC: 32.1 g/dL (ref 32.0–36.0)
MCV: 86.3 fL (ref 80.0–100.0)
MONO ABS: 1.8 10*3/uL — AB (ref 0.2–0.9)
Monocytes Relative: 9 %
Neutro Abs: 15.6 10*3/uL — ABNORMAL HIGH (ref 1.4–6.5)
Neutrophils Relative %: 79 %
PLATELETS: 515 10*3/uL — AB (ref 150–440)
RBC: 5.09 MIL/uL (ref 3.80–5.20)
RDW: 15 % — AB (ref 11.5–14.5)
WBC: 19.5 10*3/uL — ABNORMAL HIGH (ref 3.6–11.0)

## 2017-04-28 LAB — COMPREHENSIVE METABOLIC PANEL
ALBUMIN: 2.7 g/dL — AB (ref 3.5–5.0)
ALK PHOS: 135 U/L — AB (ref 38–126)
ALT: 36 U/L (ref 14–54)
AST: 29 U/L (ref 15–41)
Anion gap: 6 (ref 5–15)
BILIRUBIN TOTAL: 1 mg/dL (ref 0.3–1.2)
BUN: 21 mg/dL — AB (ref 6–20)
CALCIUM: 8.5 mg/dL — AB (ref 8.9–10.3)
CO2: 27 mmol/L (ref 22–32)
Chloride: 104 mmol/L (ref 101–111)
Creatinine, Ser: 1.14 mg/dL — ABNORMAL HIGH (ref 0.44–1.00)
GFR calc Af Amer: 58 mL/min — ABNORMAL LOW (ref >60)
GFR calc non Af Amer: 50 mL/min — ABNORMAL LOW (ref >60)
GLUCOSE: 113 mg/dL — AB (ref 65–99)
POTASSIUM: 3.7 mmol/L (ref 3.5–5.1)
Sodium: 137 mmol/L (ref 135–145)
TOTAL PROTEIN: 6.7 g/dL (ref 6.5–8.1)

## 2017-04-28 LAB — PROTIME-INR
INR: 1.06
Prothrombin Time: 13.7 seconds (ref 11.4–15.2)

## 2017-04-28 LAB — LIPASE, BLOOD: Lipase: 21 U/L (ref 11–51)

## 2017-04-28 IMAGING — CT CT CORE BIOPSY RENAL
1 of 4 series · 11 of 32 positions shown, 17 images · non-contrast
Comparison: CT of the abdomen and pelvis - earlier same day

INDICATION: Diverticular abscess. Please perform CT-guided percutaneous drainage
catheter placement for infection source control purposes.

EXAM:
CT-GUIDED LEFT TRANS GLUTEAL APPROACH PERCUTANEOUS DRAINAGE CATHETER
PLACEMENT

[Series 2: i-spiral 5.0 b30f · axial · 0.84mm/px · z∈[-364,-196]mm · 11 of 58 slices shown, 17 images]
[im 5/58  soft-tissue]
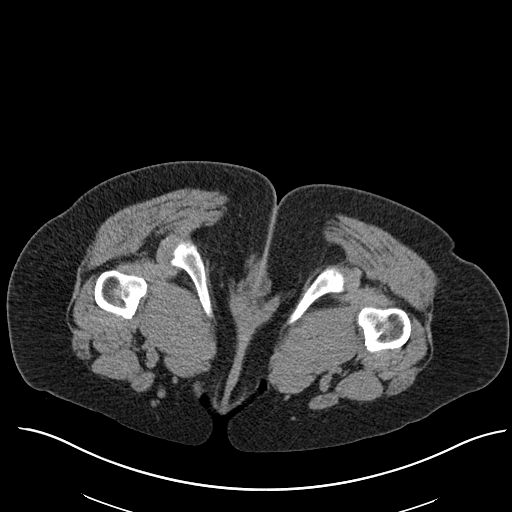
[im 5/58  bone]
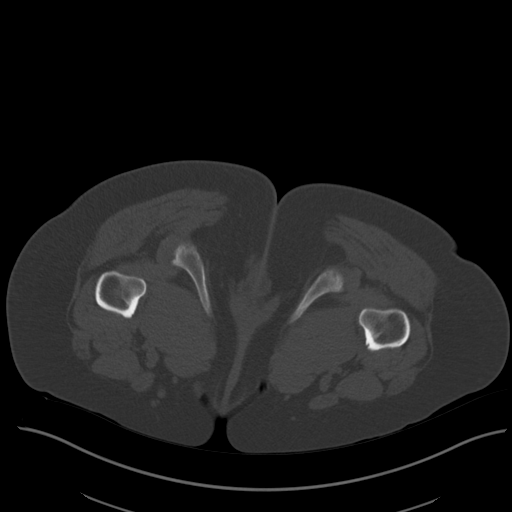
[im 10/58  soft-tissue]
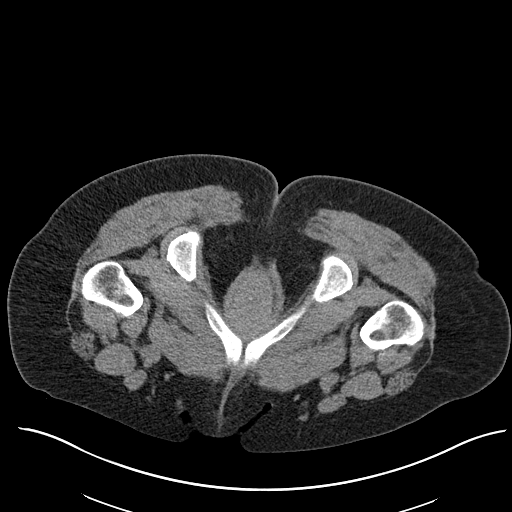
[im 15/58  soft-tissue]
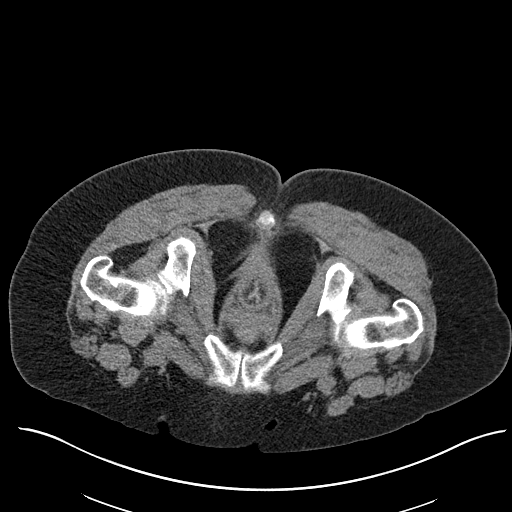
[im 20/58  soft-tissue]
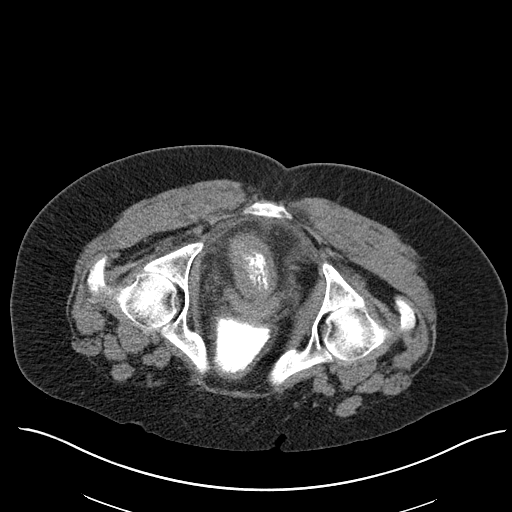
[im 24/58  soft-tissue]
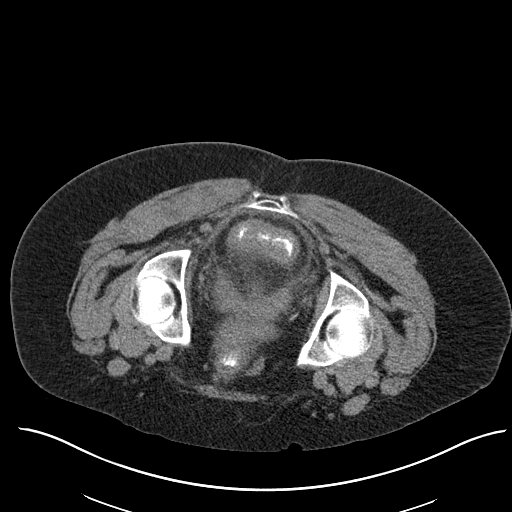
[im 29/58  soft-tissue]
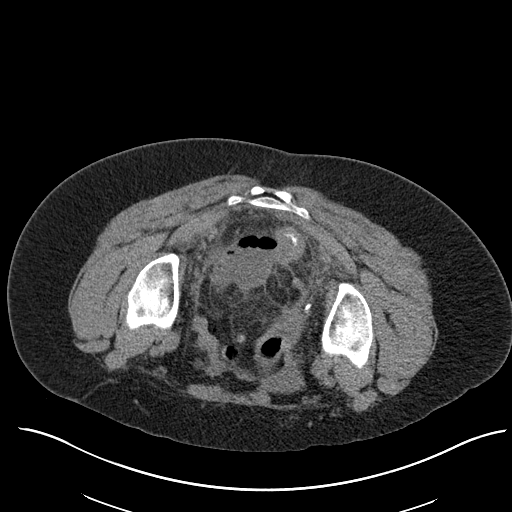
[im 34/58  soft-tissue]
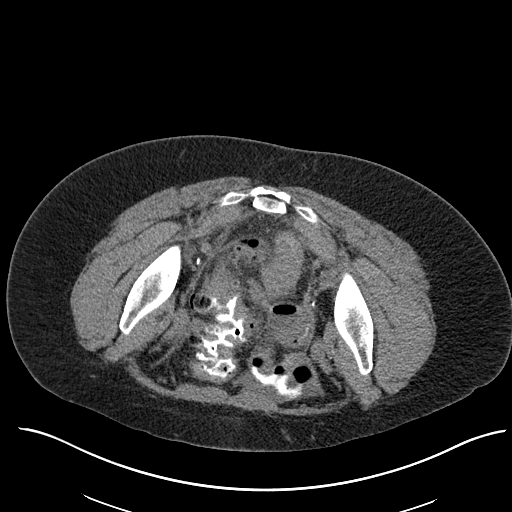
[im 39/58  soft-tissue]
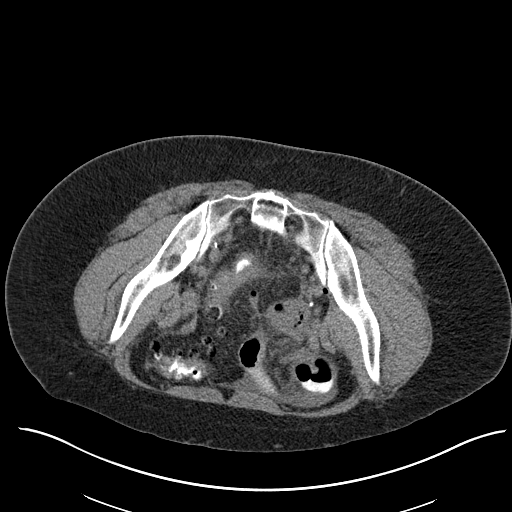
[im 39/58  lung]
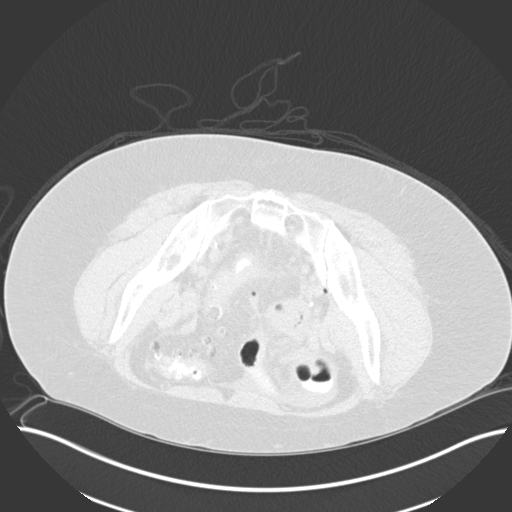
[im 43/58  soft-tissue]
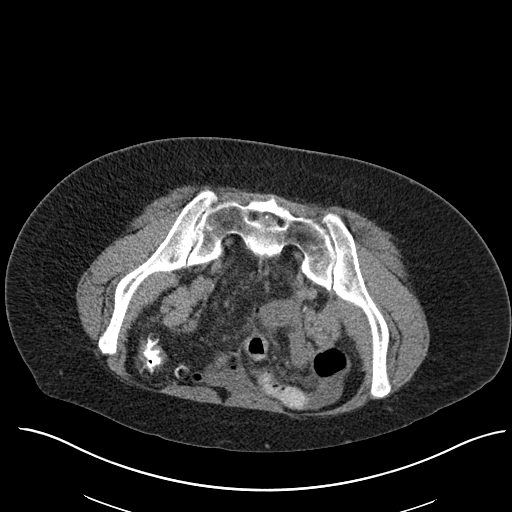
[im 43/58  lung]
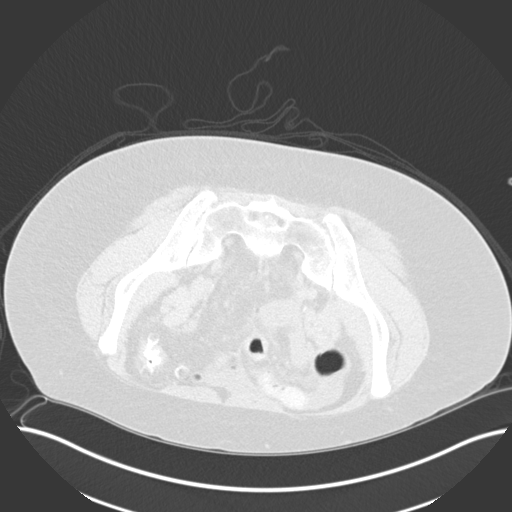
[im 43/58  bone]
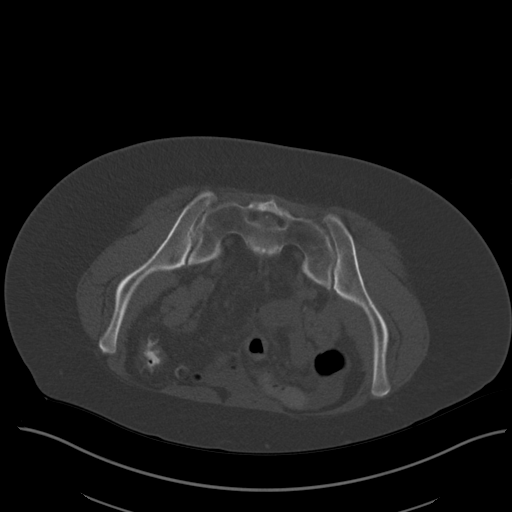
[im 48/58  soft-tissue]
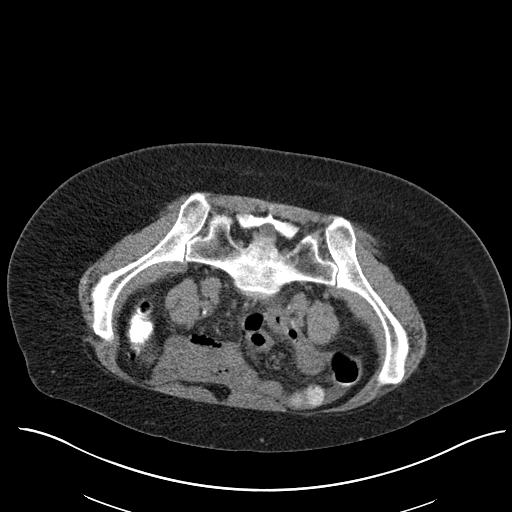
[im 48/58  lung]
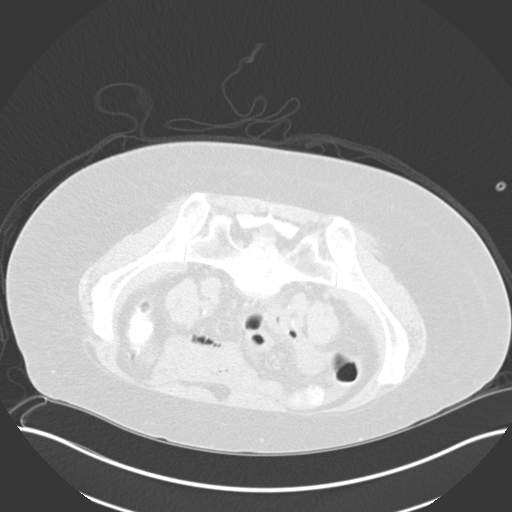
[im 53/58  soft-tissue]
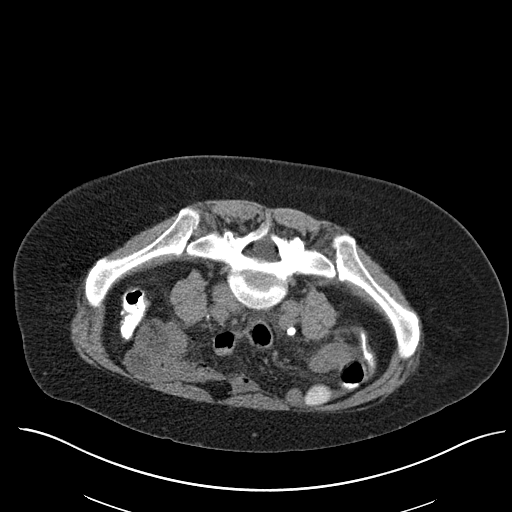
[im 53/58  lung]
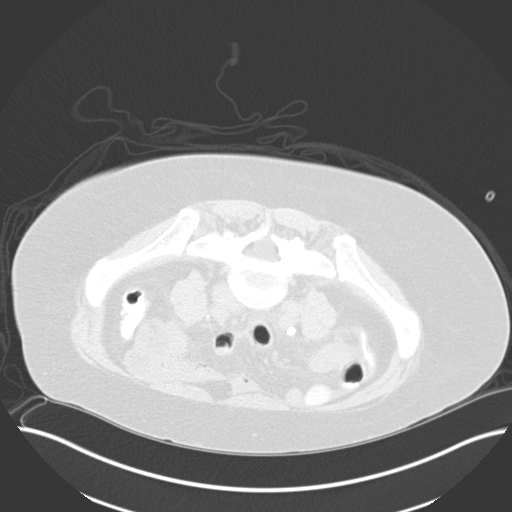

[11 of 32 positions shown; findings below may reference images not displayed]

MEDICATIONS:
The patient is currently admitted to the hospital and receiving
intravenous antibiotics. The antibiotics were administered within an
appropriate time frame prior to the initiation of the procedure.

ANESTHESIA/SEDATION:
Moderate (conscious) sedation was employed during this procedure. A
total of Versed 4 mg and Fentanyl 100 mcg was administered
intravenously.

Moderate Sedation Time: 24 minutes. The patient's level of
consciousness and vital signs were monitored continuously by
radiology nursing throughout the procedure under my direct
supervision.

CONTRAST:  None

COMPLICATIONS:
None immediate.

PROCEDURE:
Informed written consent was obtained from the patient after a
discussion of the risks, benefits and alternatives to treatment. The
patient was placed prone on the CT gantry and a pre procedural CT
was performed re-demonstrating the known abscess/fluid collection
within the lower pelvis with dominant component measuring
approximately 6.5 x 4.5 cm (image 30, series 2). The procedure was
planned. A timeout was performed prior to the initiation of the
procedure.

The skin overlying the left buttocks was prepped and draped in the
usual sterile fashion. The overlying soft tissues were anesthetized
with 1% lidocaine with epinephrine. Appropriate trajectory was
planned with the use of a 22 gauge spinal needle. An 18 gauge trocar
needle was advanced into the abscess/fluid collection and a short
Amplatz super stiff wire was coiled within the collection.
Appropriate positioning was confirmed with a limited CT scan. The
tract was serially dilated allowing placement of a 10 French
all-purpose drainage catheter. Appropriate positioning was confirmed
with a limited postprocedural CT scan.

Approximately 25 Ml of purulent fluid was aspirated. The tube was
connected to a drainage bag and sutured in place. A dressing was
placed. The patient tolerated the procedure well without immediate
post procedural complication.
IMPRESSION: Successful CT guided placement of a 10 French all purpose drain
catheter into the lower pelvis via left trans gluteal approach with
aspiration of 25 mL of purulent fluid. Samples were sent to the
laboratory as requested by the ordering clinical team.

## 2017-04-28 IMAGING — CT CT ABD-PELV W/ CM
2 of 5 series · 14 of 46 positions shown, 16 images · IV contrast (APPLIED)
Comparison: None.

CLINICAL DATA: Persistent UTIs with abdominal pain, initial
encounter

EXAM:
CT ABDOMEN AND PELVIS WITH CONTRAST
TECHNIQUE: Multidetector CT imaging of the abdomen and pelvis was performed
using the standard protocol following bolus administration of
intravenous contrast.
CONTRAST:  100mL [TW] IOPAMIDOL ([TW]) INJECTION 61%

[Series 2: axial st · axial · 0.74mm/px · z∈[-624,-174]mm · 11 of 102 slices shown, 13 images]
[im 6/102  soft-tissue]
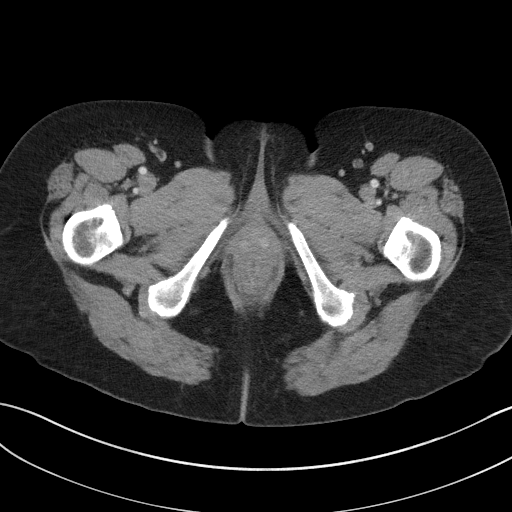
[im 6/102  bone]
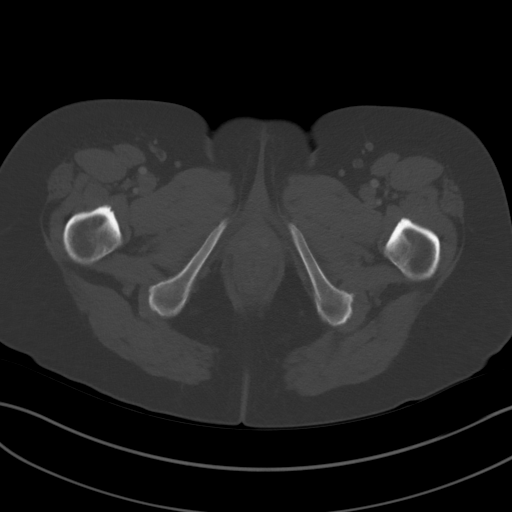
[im 17/102  soft-tissue]
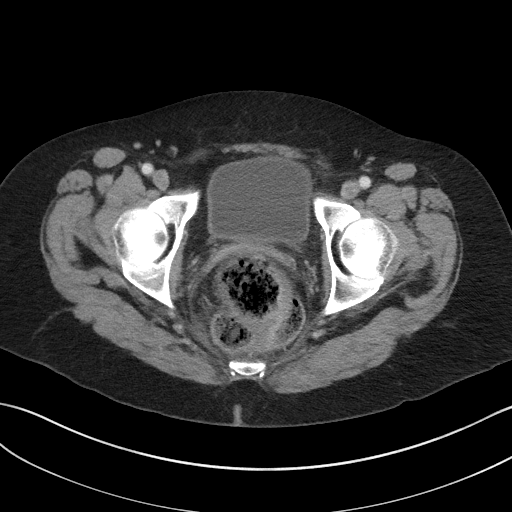
[im 23/102  soft-tissue]
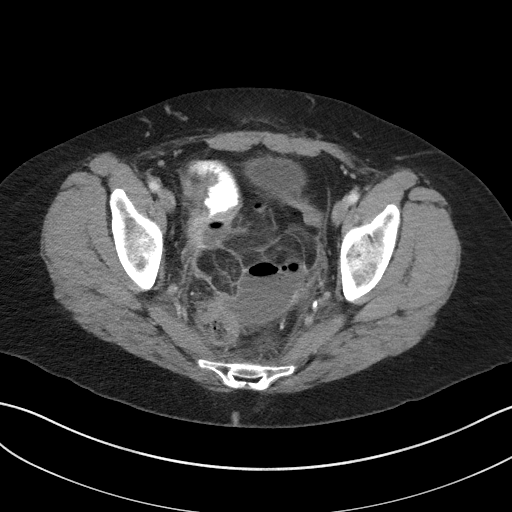
[im 34/102  soft-tissue]
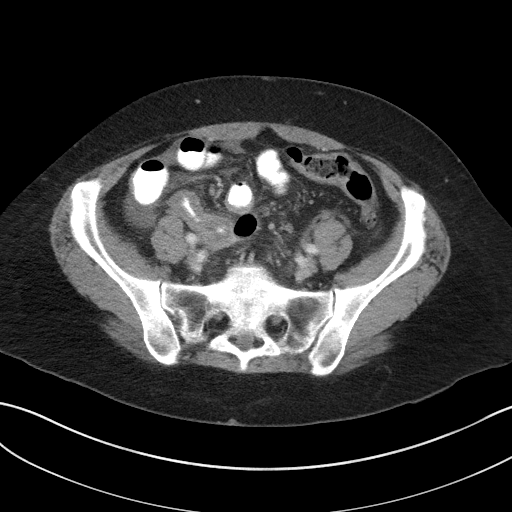
[im 40/102  soft-tissue]
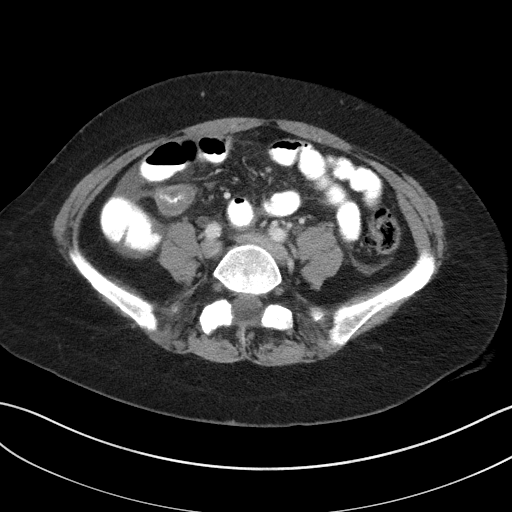
[im 51/102  soft-tissue]
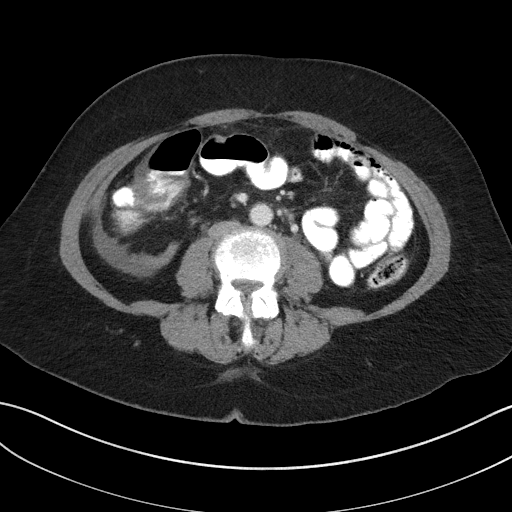
[im 62/102  soft-tissue]
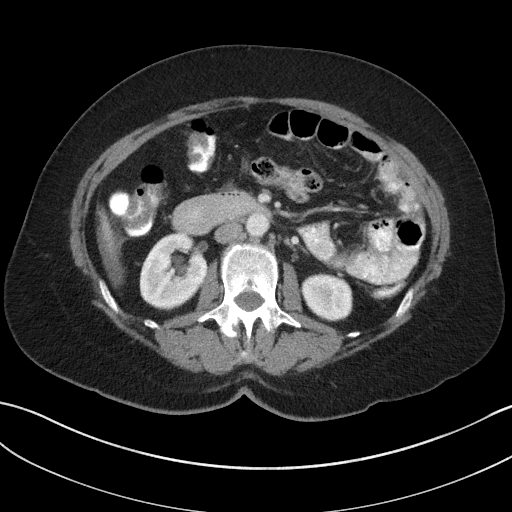
[im 68/102  soft-tissue]
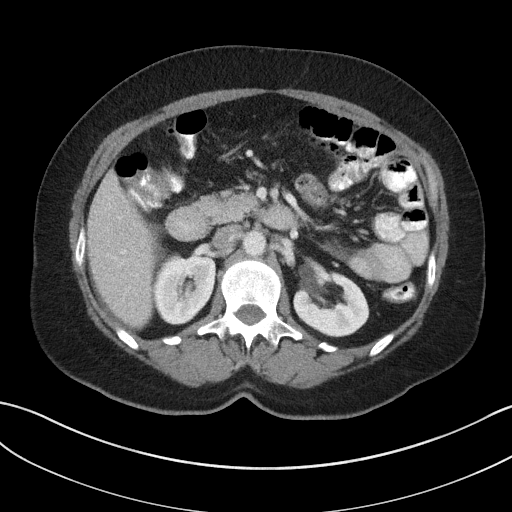
[im 79/102  soft-tissue]
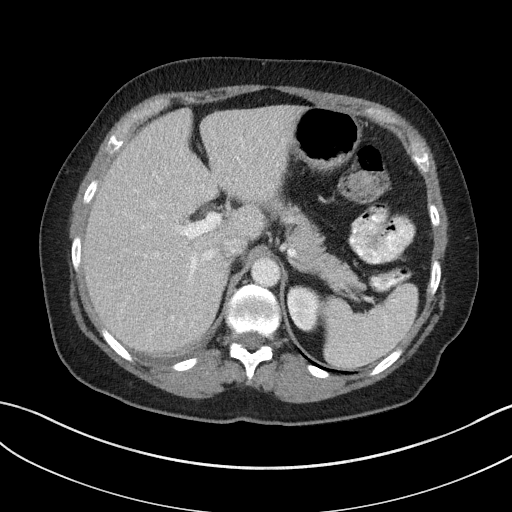
[im 79/102  bone]
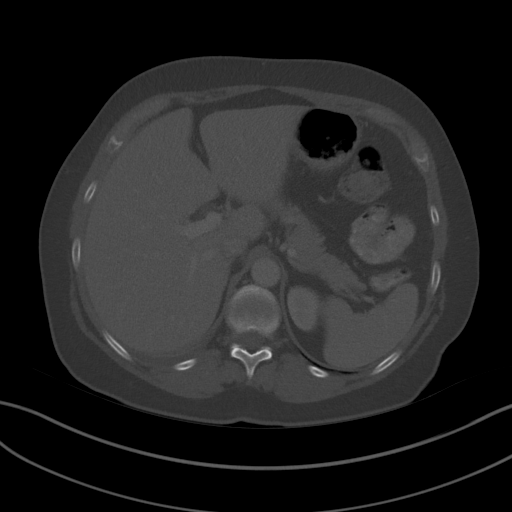
[im 85/102  soft-tissue]
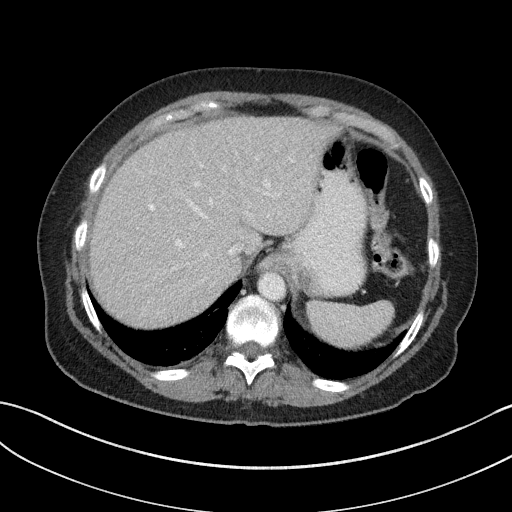
[im 96/102  soft-tissue]
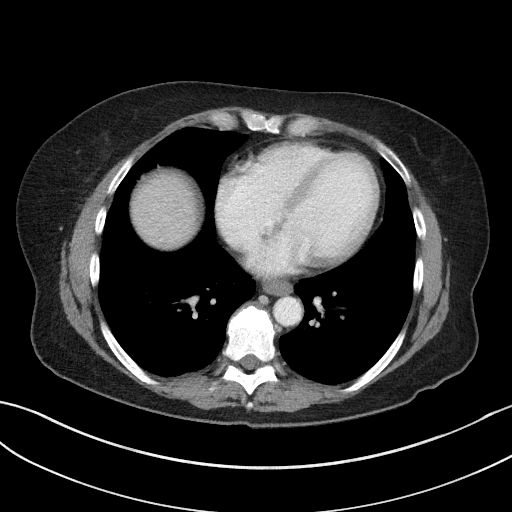

[Series 5: coronal st · coronal · 0.69mm/px · 3 of 76 slices shown]
[im 26/76  soft-tissue]
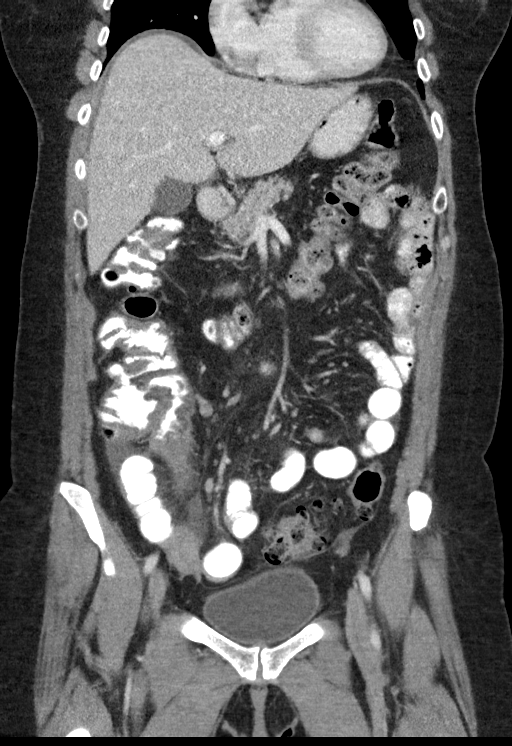
[im 34/76  soft-tissue]
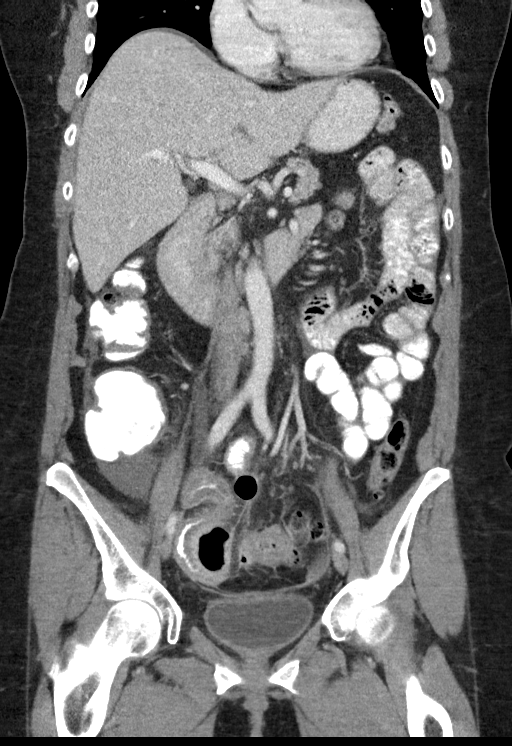
[im 42/76  soft-tissue]
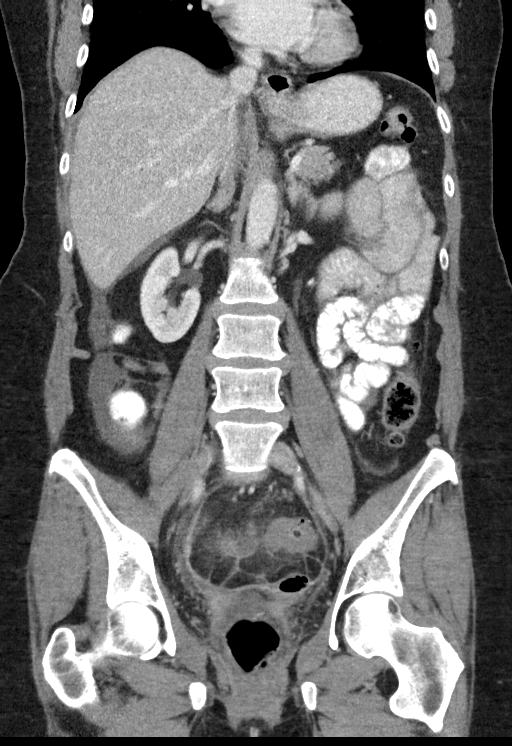

[14 of 46 positions shown; findings below may reference images not displayed]

FINDINGS: Lower chest: Minimal bibasilar atelectatic changes are noted.

Hepatobiliary: No focal liver abnormality is seen. No gallstones,
gallbladder wall thickening, or biliary dilatation.

Pancreas: Unremarkable. No pancreatic ductal dilatation or
surrounding inflammatory changes.

Spleen: Normal in size without focal abnormality.

Adrenals/Urinary Tract: The adrenal glands are within normal limits
bilaterally. The bladder is well distended. The collecting systems
are mildly prominent bilaterally likely related to distal ureteral
compression. No ureteral calculi are seen. No renal calculi are
noted. Small parapelvic cysts are noted as well.

Stomach/Bowel: The appendix is well visualized and within normal
limits. Extensive diverticular change is noted within the colon
particularly in the sigmoid colon where extensive diverticulitis is
noted. Pericolonic inflammatory changes noted in there are at least
2 extraluminal air fluid collections identified. One of these lies
just superior to the sigmoid colon and measures approximately 6.1 by
4.0 cm. The second lies somewhat more superiorly adjacent to the
terminal ileum and measures approximately 4.2 cm in greatest
dimension. A smaller 15 mm air-fluid collection is noted interposed
between the two larger collections and may represent an area of
communication between the collections. Considerable thickening of
the distal small bowel wall is noted with edema. This is likely
reactive in nature although the possibility of an intrinsic
small-bowel abnormality could not be totally excluded on this exam.
The appendix is within normal limits. Some free fluid is noted
within the abdomen and pelvis in addition to the previously
described air-fluid collections.

Vascular/Lymphatic: No significant vascular findings are present. No
enlarged abdominal or pelvic lymph nodes.

Reproductive: Status post hysterectomy. No adnexal masses.

Other: Free fluid is noted as described above. No hernia is
identified.

Musculoskeletal: Mild degenerative changes of the lumbar spine are
noted.
IMPRESSION: Changes most consistent with diverticulitis with multiple air-fluid
collections surrounding the sigmoid colon as described. There is a
smaller interposing air-fluid collection between the two larger
collections likely representing some intercommunication between the
two.

Small bowel inflammatory changes noted involving the distal ileum.
This is likely reactive in nature although the possibility of
intrinsic small bowel disease could not be totally excluded on the
basis of this exam.

No evidence of fistulization to the urinary bladder is noted. Some
mild fullness of the collecting systems is seen related to distal
ureteral compression.

## 2017-04-28 MED ORDER — INFLUENZA VAC SPLIT QUAD 0.5 ML IM SUSY
0.5000 mL | PREFILLED_SYRINGE | INTRAMUSCULAR | Status: DC
  Filled 2017-04-28: qty 0.5

## 2017-04-28 MED ORDER — MORPHINE SULFATE (PF) 2 MG/ML IV SOLN
2.0000 mg | INTRAVENOUS | Status: DC | PRN
  Administered 2017-04-28: 2 mg via INTRAVENOUS
  Administered 2017-04-28: 22:00:00 4 mg via INTRAVENOUS
  Administered 2017-04-28: 18:00:00 2 mg via INTRAVENOUS
  Administered 2017-04-29: 4 mg via INTRAVENOUS
  Administered 2017-04-29 (×4): 2 mg via INTRAVENOUS
  Administered 2017-04-29: 21:00:00 4 mg via INTRAVENOUS
  Filled 2017-04-28 (×2): qty 1
  Filled 2017-04-28 (×2): qty 2
  Filled 2017-04-28 (×7): qty 1

## 2017-04-28 MED ORDER — ONDANSETRON HCL 4 MG PO TABS: 4.0000 mg | ORAL_TABLET | Freq: Four times a day (QID) | ORAL | Status: DC | PRN

## 2017-04-28 MED ORDER — ZOLPIDEM TARTRATE 5 MG PO TABS
5.0000 mg | ORAL_TABLET | Freq: Every day | ORAL | Status: DC
  Administered 2017-04-28 – 2017-05-03 (×6): 5 mg via ORAL
  Filled 2017-04-28 (×6): qty 1

## 2017-04-28 MED ORDER — ONDANSETRON HCL 4 MG/2ML IJ SOLN: 4.0000 mg | Freq: Four times a day (QID) | INTRAMUSCULAR | Status: DC | PRN

## 2017-04-28 MED ORDER — FENTANYL CITRATE (PF) 100 MCG/2ML IJ SOLN
INTRAMUSCULAR | Status: AC | PRN
  Administered 2017-04-28: 50 ug via INTRAVENOUS
  Administered 2017-04-28 (×2): 25 ug via INTRAVENOUS

## 2017-04-28 MED ORDER — SODIUM CHLORIDE 0.9 % IV SOLN
INTRAVENOUS | Status: AC | PRN
  Administered 2017-04-28: 10 mL/h via INTRAVENOUS

## 2017-04-28 MED ORDER — MORPHINE SULFATE (PF) 2 MG/ML IV SOLN
2.0000 mg | INTRAVENOUS | Status: DC | PRN
  Administered 2017-04-28: 17:00:00 2 mg via INTRAVENOUS
  Filled 2017-04-28: qty 1

## 2017-04-28 MED ORDER — IOPAMIDOL (ISOVUE-300) INJECTION 61%
100.0000 mL | Freq: Once | INTRAVENOUS | Status: AC | PRN
  Administered 2017-04-28: 100 mL via INTRAVENOUS

## 2017-04-28 MED ORDER — SODIUM CHLORIDE 0.9 % IV SOLN
3.0000 g | INTRAVENOUS | Status: AC
  Administered 2017-04-28: 3 g via INTRAVENOUS
  Filled 2017-04-28: qty 3

## 2017-04-28 MED ORDER — SODIUM CHLORIDE 0.9% FLUSH
5.0000 mL | Freq: Three times a day (TID) | INTRAVENOUS | Status: DC
  Administered 2017-04-29 – 2017-05-03 (×8): 5 mL via INTRAVENOUS
  Administered 2017-05-03: 18:00:00 3 mL via INTRAVENOUS
  Administered 2017-05-03 – 2017-05-04 (×2): 5 mL via INTRAVENOUS

## 2017-04-28 MED ORDER — METRONIDAZOLE IN NACL 5-0.79 MG/ML-% IV SOLN
500.0000 mg | Freq: Once | INTRAVENOUS | Status: AC
  Administered 2017-04-28: 500 mg via INTRAVENOUS
  Filled 2017-04-28: qty 100

## 2017-04-28 MED ORDER — IOPAMIDOL (ISOVUE-300) INJECTION 61%
30.0000 mL | Freq: Once | INTRAVENOUS | Status: AC | PRN
  Administered 2017-04-28: 30 mL via ORAL

## 2017-04-28 MED ORDER — SODIUM CHLORIDE 0.9 % IV SOLN: 3.0000 g | Freq: Four times a day (QID) | INTRAVENOUS | Status: DC

## 2017-04-28 MED ORDER — CYCLOBENZAPRINE HCL 10 MG PO TABS
10.0000 mg | ORAL_TABLET | Freq: Three times a day (TID) | ORAL | Status: DC
  Administered 2017-04-28 – 2017-05-04 (×13): 10 mg via ORAL
  Filled 2017-04-28 (×13): qty 1

## 2017-04-28 MED ORDER — LIDOCAINE HCL (PF) 1 % IJ SOLN
INTRAMUSCULAR | Status: AC | PRN
  Administered 2017-04-28: 8 mL

## 2017-04-28 MED ORDER — DEXTROSE IN LACTATED RINGERS 5 % IV SOLN
INTRAVENOUS | Status: DC
  Administered 2017-04-28 – 2017-04-29 (×3): via INTRAVENOUS
  Administered 2017-04-29: 17:00:00 125 mL/h via INTRAVENOUS
  Administered 2017-04-29 – 2017-05-02 (×5): via INTRAVENOUS
  Filled 2017-04-28 (×2): qty 1000

## 2017-04-28 MED ORDER — FENTANYL CITRATE (PF) 100 MCG/2ML IJ SOLN
INTRAMUSCULAR | Status: AC
  Filled 2017-04-28: qty 4

## 2017-04-28 MED ORDER — HEPARIN SODIUM (PORCINE) 5000 UNIT/ML IJ SOLN
5000.0000 [IU] | Freq: Three times a day (TID) | INTRAMUSCULAR | Status: DC
  Administered 2017-04-30 – 2017-05-03 (×7): 5000 [IU] via SUBCUTANEOUS
  Filled 2017-04-28 (×11): qty 1

## 2017-04-28 MED ORDER — MIDAZOLAM HCL 5 MG/5ML IJ SOLN
INTRAMUSCULAR | Status: AC
  Filled 2017-04-28: qty 5

## 2017-04-28 MED ORDER — PIPERACILLIN-TAZOBACTAM 3.375 G IVPB
3.3750 g | Freq: Three times a day (TID) | INTRAVENOUS | Status: DC
  Administered 2017-04-28 – 2017-05-04 (×18): 3.375 g via INTRAVENOUS
  Filled 2017-04-28 (×19): qty 50

## 2017-04-28 MED ORDER — ETODOLAC 300 MG PO CAPS
500.0000 mg | ORAL_CAPSULE | Freq: Every evening | ORAL | Status: DC
  Administered 2017-05-02 – 2017-05-03 (×2): 500 mg via ORAL
  Filled 2017-04-28 (×7): qty 1

## 2017-04-28 MED ORDER — MIDAZOLAM HCL 5 MG/5ML IJ SOLN
INTRAMUSCULAR | Status: AC | PRN
  Administered 2017-04-28 (×4): 1 mg via INTRAVENOUS

## 2017-04-28 NOTE — Progress Notes (Signed)
Pharmacy Antibiotic Note  Frances Gallagher is a 61 y.o. female admitted on 04/28/2017 with Intra abdominal infection/diverticular abscess.  Pharmacy has been consulted for Unasyn dosing.  (Patient w/ hx UTI on 12/23= E.coli pan sensitive, per ED triage note- completed Cipro course)  Plan: Unasyn 3 gram IV x 1 given in ER. Will continue with Unasyn 3 gram IV q6h.   Patient for likely drainage of abscess    Height: 5\' 7"  (170.2 cm) Weight: 183 lb (83 kg) IBW/kg (Calculated) : 61.6  No data recorded.  Recent Labs  Lab 04/28/17 0923  CREATININE 1.14*    Estimated Creatinine Clearance: 56 mL/min (A) (by C-G formula based on SCr of 1.14 mg/dL (H)).    No Known Allergies  Antimicrobials this admission: Unasyn 12/28 >>       >>    Dose adjustments this admission:    Microbiology results:   BCx:     UCx:    12/28 UA= negative   Sputum:      MRSA PCR:    Thank you for allowing pharmacy to be a part of this patient's care.  Donyelle Enyeart A 04/28/2017 1:16 PM

## 2017-04-28 NOTE — ED Triage Notes (Signed)
Pt had UTI dx one week before christmas.  Here today because remains to have lower abdominal pain with intermittent low grade fever 99-100 per pt.  No vomiting. Was treated with cipro and finished this.  Had lab work done after finished abx and still showing sx UTI.  Had ecoli in urine on culture prior to abx.

## 2017-04-28 NOTE — Procedures (Signed)
Pre procedural Dx: Diverticular Abscess Post procedural Dx: Same  Technically successful CT guided placed of a 10 Fr drainage catheter placement into the lower pelvis via L TG approach yielding 25 cc of purulent material.    All aspirated samples sent to the laboratory for analysis.    EBL: None  Complications: None immediate  Ronny Bacon, MD Pager #: [phone]

## 2017-04-28 NOTE — H&P (Signed)
Frances Gallagher is an 61 y.o. female.    Chief Complaint: Abdominal pain  HPI: This a patient with a 8-10-day history of lower abdominal pain.  She thought she had a UTI at first her pain is coming in waves and sometimes minimal but sometimes worse especially in both lower quadrants.  She has had some nausea but no emesis.  She has been febrile for several days at home.  She states she tried to contact her physician but could not over the Christmas holiday.  She states she is never had an episode like this before her last colonoscopy was probably 12 years ago.  She does not have a family history of colon cancer but is not sure about her mother who died of lung cancer and thinks that it could have been metastatic.  History reviewed. No pertinent past medical history.  Past Surgical History:  Procedure Laterality Date  . ABDOMINAL HYSTERECTOMY    . KNEE SURGERY      Family History  Problem Relation Age of Onset  . Lung cancer Mother   . Breast cancer Maternal Aunt    Social History:  reports that  has never smoked. she has never used smokeless tobacco. She reports that she does not drink alcohol or use drugs.  Allergies: No Known Allergies   (Not in a hospital admission)   Review of Systems  Constitutional: Positive for chills and fever. Negative for malaise/fatigue and weight loss.  HENT: Negative.   Eyes: Negative.   Respiratory: Negative.   Cardiovascular: Negative.   Gastrointestinal: Positive for abdominal pain, heartburn and nausea. Negative for blood in stool, constipation, diarrhea and vomiting.  Genitourinary: Positive for dysuria and urgency.  Musculoskeletal: Negative.   Skin: Negative.   Neurological: Negative.   Endo/Heme/Allergies: Negative.   Psychiatric/Behavioral: Negative.      Physical Exam:  BP 119/82   Pulse 81   Ht _0  (1.702 m)   Wt 183 lb (83 kg)   SpO2 95%   BMI 28.66 kg/m   Physical Exam  Constitutional: She is oriented to person,  place, and time and well-developed, well-nourished, and in no distress. No distress.  HENT:  Head: Normocephalic and atraumatic.  Eyes: Pupils are equal, round, and reactive to light. Right eye exhibits no discharge. Left eye exhibits no discharge. No scleral icterus.  Neck: Normal range of motion. No JVD present.  Cardiovascular: Normal rate, regular rhythm and normal heart sounds.  Pulmonary/Chest: Effort normal and breath sounds normal. No respiratory distress. She has no wheezes. She has no rales.  Abdominal: Soft. She exhibits no distension. There is no tenderness. There is no rebound and no guarding.  Abdomen is surprisingly nontender.  There is no guarding rebound or percussion tenderness and no mass.  She points to both lower quadrants but I cannot elicit any tenderness.  Musculoskeletal: Normal range of motion. She exhibits no edema or tenderness.  Lymphadenopathy:    She has no cervical adenopathy.  Neurological: She is alert and oriented to person, place, and time.  Skin: Skin is warm and dry. No rash noted. She is not diaphoretic. No erythema.  Psychiatric: Mood and affect normal.  Vitals reviewed.       Results for orders placed or performed during the hospital encounter of 04/28/17 (from the past 48 hour(s))  Urinalysis, Complete w Microscopic     Status: Abnormal   Collection Time: 04/28/17  9:23 AM  Result Value Ref Range   Color, Urine AMBER (A)  YELLOW    Comment: BIOCHEMICALS MAY BE AFFECTED BY COLOR   APPearance HAZY (A) CLEAR   Specific Gravity, Urine 1.039 (H) 1.005 - 1.030   pH 5.0 5.0 - 8.0   Glucose, UA NEGATIVE NEGATIVE mg/dL   Hgb urine dipstick NEGATIVE NEGATIVE   Bilirubin Urine MODERATE (A) NEGATIVE   Ketones, ur 5 (A) NEGATIVE mg/dL   Protein, ur 100 (A) NEGATIVE mg/dL   Nitrite NEGATIVE NEGATIVE   Leukocytes, UA NEGATIVE NEGATIVE   RBC / HPF 6-30 0 - 5 RBC/hpf   WBC, UA 0-5 0 - 5 WBC/hpf   Bacteria, UA NONE SEEN NONE SEEN   Squamous Epithelial /  LPF 0-5 (A) NONE SEEN   Mucus PRESENT    Hyaline Casts, UA PRESENT     Comment: Performed at [facility], [address] [place], [place] [address]  Lipase, blood     Status: None   Collection Time: 04/28/17  9:23 AM  Result Value Ref Range   Lipase 21 11 - 51 U/L    Comment: Performed at [facility], [address] [place], [place] [address]  Comprehensive metabolic panel     Status: Abnormal   Collection Time: 04/28/17  9:23 AM  Result Value Ref Range   Sodium 137 135 - 145 mmol/L   Potassium 3.7 3.5 - 5.1 mmol/L   Chloride 104 101 - 111 mmol/L   CO2 27 22 - 32 mmol/L   Glucose, Bld 113 (H) 65 - 99 mg/dL   BUN 21 (H) 6 - 20 mg/dL   Creatinine, Ser 1.14 (H) 0.44 - 1.00 mg/dL   Calcium 8.5 (L) 8.9 - 10.3 mg/dL   Total Protein 6.7 6.5 - 8.1 g/dL   Albumin 2.7 (L) 3.5 - 5.0 g/dL   AST 29 15 - 41 U/L   ALT 36 14 - 54 U/L   Alkaline Phosphatase 135 (H) 38 - 126 U/L   Total Bilirubin 1.0 0.3 - 1.2 mg/dL   GFR calc non Af Amer 50 (L) >60 mL/min   GFR calc Af Amer 58 (L) >60 mL/min    Comment: (NOTE) The eGFR has been calculated using the CKD EPI equation. This calculation has not been validated in all clinical situations. eGFR's persistently <60 mL/min signify possible Chronic Kidney Disease.    Anion gap 6 5 - 15    Comment: Performed at [facility], [address] [place], [place] [address]  CBC with Differential     Status: Abnormal   Collection Time: 04/28/17 12:30 PM  Result Value Ref Range   WBC 19.5 (H) 3.6 - 11.0 K/uL   RBC 5.09 3.80 - 5.20 MIL/uL   Hemoglobin 14.1 12.0 - 16.0 g/dL   HCT 43.9 35.0 - 47.0 %   MCV 86.3 80.0 - 100.0 fL   MCH 27.7 26.0 - 34.0 pg   MCHC 32.1 32.0 - 36.0 g/dL   RDW 15.0 (H) 11.5 - 14.5 %   Platelets 515 (H) 150 - 440 K/uL   Neutrophils Relative % 79 %   Neutro Abs 15.6 (H) 1.4 - 6.5 K/uL   Lymphocytes Relative 10 %   Lymphs Abs 1.9 1.0 - 3.6 K/uL   Monocytes Relative 9 %   Monocytes Absolute 1.8 (H)  0.2 - 0.9 K/uL   Eosinophils Relative 1 %   Eosinophils Absolute 0.1 0 - 0.7 K/uL   Basophils Relative 1 %   Basophils Absolute 0.1 0 - 0.1 K/uL    Comment:  Performed at [facility], [address] [place], [place] [address]   Ct Abdomen Pelvis W Contrast  Result Date: 04/28/2017 CLINICAL DATA:  Persistent UTIs with abdominal pain, initial encounter EXAM: CT ABDOMEN AND PELVIS WITH CONTRAST TECHNIQUE: Multidetector CT imaging of the abdomen and pelvis was performed using the standard protocol following bolus administration of intravenous contrast. CONTRAST:  130m ISOVUE-300 IOPAMIDOL (ISOVUE-300) INJECTION 61% COMPARISON:  None. FINDINGS: Lower chest: Minimal bibasilar atelectatic changes are noted. Hepatobiliary: No focal liver abnormality is seen. No gallstones, gallbladder wall thickening, or biliary dilatation. Pancreas: Unremarkable. No pancreatic ductal dilatation or surrounding inflammatory changes. Spleen: Normal in size without focal abnormality. Adrenals/Urinary Tract: The adrenal glands are within normal limits bilaterally. The bladder is well distended. The collecting systems are mildly prominent bilaterally likely related to distal ureteral compression. No ureteral calculi are seen. No renal calculi are noted. Small parapelvic cysts are noted as well. Stomach/Bowel: The appendix is well visualized and within normal limits. Extensive diverticular change is noted within the colon particularly in the sigmoid colon where extensive diverticulitis is noted. Pericolonic inflammatory changes noted in there are at least 2 extraluminal air fluid collections identified. One of these lies just superior to the sigmoid colon and measures approximately 6.1 by 4.0 cm. The second lies somewhat more superiorly adjacent to the terminal ileum and measures approximately 4.2 cm in greatest dimension. A smaller 15 mm air-fluid collection is noted interposed between the two larger collections and may  represent an area of communication between the collections. Considerable thickening of the distal small bowel wall is noted with edema. This is likely reactive in nature although the possibility of an intrinsic small-bowel abnormality could not be totally excluded on this exam. The appendix is within normal limits. Some free fluid is noted within the abdomen and pelvis in addition to the previously described air-fluid collections. Vascular/Lymphatic: No significant vascular findings are present. No enlarged abdominal or pelvic lymph nodes. Reproductive: Status post hysterectomy. No adnexal masses. Other: Free fluid is noted as described above. No hernia is identified. Musculoskeletal: Mild degenerative changes of the lumbar spine are noted. IMPRESSION: Changes most consistent with diverticulitis with multiple air-fluid collections surrounding the sigmoid colon as described. There is a smaller interposing air-fluid collection between the two larger collections likely representing some intercommunication between the two. Small bowel inflammatory changes noted involving the distal ileum. This is likely reactive in nature although the possibility of intrinsic small bowel disease could not be totally excluded on the basis of this exam. No evidence of fistulization to the urinary bladder is noted. Some mild fullness of the collecting systems is seen related to distal ureteral compression. Electronically Signed   By: MInez CatalinaM.D.   On: 04/28/2017 11:44     Assessment/Plan  This patient with acute diverticulitis.  I have personally reviewed her CT scan that shows multiple abscesses.  The largest is large enough to be able to access probably transgluteal E.  I discussed with she and her husband the rationale for CT-guided drainage and IV antibiotics with admission to the hospital.  She is surprisingly nontender considering what her CT scan looks like.  She understood this rationale.  I discussed with her the  options of urgent surgical intervention that would require a colostomy should CT-guided drainage not be possible.  They understood and agreed with this plan  RFlorene Glen MD, FACS

## 2017-04-28 NOTE — Consult Note (Signed)
Chief Complaint: Diverticular Abscess  Referring Physician(s): Burt Knack  Patient Status: [facility] - In-pt  History of Present Illness: Frances Gallagher is a 61 y.o. female with no significant past medical history who presented to the [place] mention department with an 8-10 day history of lower abdominal pain. The patient initially thought she was suffering from a urinary tract infection, however symptoms did not improve.   CT scan of the abdomen and pelvis performed earlie today demonstrates severe diverticulitis and development of several fluid and air collections within the lower abdomen and pelvis worrisome for developing diverticular abscesses. Dominant collection within the left hemipelvis measures approximately 6.1 x 4.0 cm.   The patient has been evaluated by the surgical team and request has been made for CT-guided percutaneous drainage catheter placement.  Patient admits to lower abdominal and pelvic pain. She admits to subjective fevers. No chest pain or shortness of breath.  History reviewed. No pertinent past medical history.  Past Surgical History:  Procedure Laterality Date  . ABDOMINAL HYSTERECTOMY    . KNEE SURGERY      Allergies: Patient has no known allergies.  Medications: Prior to Admission medications   Medication Sig Start Date End Date Taking? Authorizing Provider  cyclobenzaprine (FLEXERIL) 10 MG tablet Take 1 tablet by mouth 3 (three) times daily. 11/08/16  Yes [provider]  etodolac (LODINE) 500 MG tablet Take 1 tablet by mouth daily. 04/11/17  Yes [provider]  zolpidem (AMBIEN) 10 MG tablet Take 5 mg by mouth at bedtime. 03/08/17  Yes [provider]     Family History  Problem Relation Age of Onset  . Lung cancer Mother   . Breast cancer Maternal Aunt     Social History   Socioeconomic History  . Marital status: Married    Spouse name: None  . Number of children: None  . Years of education: None  .  Highest education level: None  Social Needs  . Financial resource strain: None  . Food insecurity - worry: None  . Food insecurity - inability: None  . Transportation needs - medical: None  . Transportation needs - non-medical: None  Occupational History  . None  Tobacco Use  . Smoking status: Never Smoker  . Smokeless tobacco: Never Used  Substance and Sexual Activity  . Alcohol use: No    Frequency: Never  . Drug use: No  . Sexual activity: None  Other Topics Concern  . None  Social History Narrative  . None    ECOG Status: 1 - Symptomatic but completely ambulatory  Review of Systems: A 12 point ROS discussed and pertinent positives are indicated in the HPI above.  All other systems are negative.  Review of Systems  Constitutional: Positive for appetite change, chills, fatigue and fever.  Respiratory: Negative.   Cardiovascular: Negative.   Gastrointestinal: Positive for abdominal pain.    Vital Signs: BP 136/67 (BP Location: Left Arm)   Pulse 82   Temp 98.4 F (36.9 C) (Oral)   Resp 20   Ht 5\' 7"  (1.702 m)   Wt 183 lb (83 kg)   SpO2 99%   BMI 28.66 kg/m   Physical Exam  Constitutional: She appears well-developed and well-nourished.  HENT:  Head: Normocephalic and atraumatic.  Cardiovascular: Normal rate and regular rhythm.  Pulmonary/Chest: Effort normal and breath sounds normal.  Abdominal: She exhibits shifting dullness. Bowel sounds are decreased. There is tenderness in the right lower quadrant and left lower quadrant. There is  rebound and guarding.  Skin: Skin is warm and dry.  Nursing note and vitals reviewed.   Imaging: Ct Abdomen Pelvis W Contrast  Result Date: 04/28/2017 CLINICAL DATA:  Persistent UTIs with abdominal pain, initial encounter EXAM: CT ABDOMEN AND PELVIS WITH CONTRAST TECHNIQUE: Multidetector CT imaging of the abdomen and pelvis was performed using the standard protocol following bolus administration of intravenous contrast.  CONTRAST:  149mL ISOVUE-300 IOPAMIDOL (ISOVUE-300) INJECTION 61% COMPARISON:  None. FINDINGS: Lower chest: Minimal bibasilar atelectatic changes are noted. Hepatobiliary: No focal liver abnormality is seen. No gallstones, gallbladder wall thickening, or biliary dilatation. Pancreas: Unremarkable. No pancreatic ductal dilatation or surrounding inflammatory changes. Spleen: Normal in size without focal abnormality. Adrenals/Urinary Tract: The adrenal glands are within normal limits bilaterally. The bladder is well distended. The collecting systems are mildly prominent bilaterally likely related to distal ureteral compression. No ureteral calculi are seen. No renal calculi are noted. Small parapelvic cysts are noted as well. Stomach/Bowel: The appendix is well visualized and within normal limits. Extensive diverticular change is noted within the colon particularly in the sigmoid colon where extensive diverticulitis is noted. Pericolonic inflammatory changes noted in there are at least 2 extraluminal air fluid collections identified. One of these lies just superior to the sigmoid colon and measures approximately 6.1 by 4.0 cm. The second lies somewhat more superiorly adjacent to the terminal ileum and measures approximately 4.2 cm in greatest dimension. A smaller 15 mm air-fluid collection is noted interposed between the two larger collections and may represent an area of communication between the collections. Considerable thickening of the distal small bowel wall is noted with edema. This is likely reactive in nature although the possibility of an intrinsic small-bowel abnormality could not be totally excluded on this exam. The appendix is within normal limits. Some free fluid is noted within the abdomen and pelvis in addition to the previously described air-fluid collections. Vascular/Lymphatic: No significant vascular findings are present. No enlarged abdominal or pelvic lymph nodes. Reproductive: Status post  hysterectomy. No adnexal masses. Other: Free fluid is noted as described above. No hernia is identified. Musculoskeletal: Mild degenerative changes of the lumbar spine are noted. IMPRESSION: Changes most consistent with diverticulitis with multiple air-fluid collections surrounding the sigmoid colon as described. There is a smaller interposing air-fluid collection between the two larger collections likely representing some intercommunication between the two. Small bowel inflammatory changes noted involving the distal ileum. This is likely reactive in nature although the possibility of intrinsic small bowel disease could not be totally excluded on the basis of this exam. No evidence of fistulization to the urinary bladder is noted. Some mild fullness of the collecting systems is seen related to distal ureteral compression. Electronically Signed   By: Inez Catalina M.D.   On: 04/28/2017 11:44    Labs:  CBC: Recent Labs    04/28/17 1230  WBC 19.5*  HGB 14.1  HCT 43.9  PLT 515*    COAGS: No results for input(s): INR, APTT in the last 8760 hours.  BMP: Recent Labs    04/28/17 0923  NA 137  K 3.7  CL 104  CO2 27  GLUCOSE 113*  BUN 21*  CALCIUM 8.5*  CREATININE 1.14*  GFRNONAA 50*  GFRAA 58*    LIVER FUNCTION TESTS: Recent Labs    04/28/17 0923  BILITOT 1.0  AST 29  ALT 36  ALKPHOS 135*  PROT 6.7  ALBUMIN 2.7*    TUMOR MARKERS: No results for input(s): AFPTM, CEA, CA199, CHROMGRNA in  the last 8760 hours.  Assessment and Plan:  Frances Gallagher is a 61 y.o. female with no significant past medical history who presented to the [place] mention department with an 8-10 day history of lower abdominal pain.  CT scan of the abdomen and pelvis performed earlie today demonstrates severe diverticulitis and development of several fluid and air collections within the lower abdomen and pelvis worrisome for developing diverticular abscesses. Dominant collection within the left  hemipelvis measures approximately 6.1 x 4.0 cm and is amendable to attempted percutaneous drainage catheter placement.   I explained that additional fluid collections within her abdomen or pelvis are currently not amenable to percutaneous drainage catheter placement though she may require additional drainage catheters in the future if these collections were to increase in size.  Risks and benefits of CT guided transgluteal approach drainage catheter placement were  discussed with the patient including bleeding, infection, damage to adjacent structures, bowel perforation/fistula connection, and sepsis.  All of the patient's questions were answered, patient is agreeable to proceed.  Consent signed and in chart.   Thank you for this interesting consult.  I greatly enjoyed meeting Bernardina Cacho and look forward to participating in their care.  A copy of this report was sent to the requesting provider on this date.  Electronically Signed: Sandi Mariscal, MD 04/28/2017, 3:41 PM   I spent a total of 20 Minutes in face to face in clinical consultation, greater than 50% of which was counseling/coordinating care for CT guided drainage catheter placement.

## 2017-04-28 NOTE — Progress Notes (Signed)
MEDICATION-RELATED CONSULT NOTE   IR Procedure Consult - Anticoagulant/Antiplatelet PTA/Inpatient Med List Review by Pharmacist    Procedure: CT drainage of diverticular abscess    Completed: 12/28 1630  Post-Procedural bleeding risk per IR MD assessment:  Standard  Antithrombotic medications on inpatient or PTA profile prior to procedure:   Heparin subcutaneous q8h    Recommended restart time per IR Post-Procedure Guidelines:  Scheduled to start at 2200.  Recommendations are to start at least 6 hours post-procedure    Chinita Greenland PharmD Clinical Pharmacist 04/28/2017

## 2017-04-28 NOTE — ED Provider Notes (Signed)
[facility] Emergency Department Provider Note   ____________________________________________   First MD Initiated Contact with Patient 04/28/17 [phone]     (approximate)  I have reviewed the triage vital signs and the nursing notes.   HISTORY  Chief Complaint Abdominal Pain    HPI Frances Gallagher is a 61 y.o. female who reports over a week ago she had some burning with urination she gets frequent UTIs and she thought that is what it was and she began having dull achy lower abdominal pain that occasionally will go up to about an 8 quickly and then go back down again to a baseline of 2 or 3 or thereabouts.  She got some antibiotics from Cipro from her doctor for possible UTI still is having a little bit of burning in the low achy abdominal pain with nausea and fevers up to 99.9.  She says she usually runs a temperature of 96.  Patient thinks the nausea might be from the pain.  But she has not really eaten much for the last week.  Nothing really seems to make the pain better or worse.   History reviewed. No pertinent past medical history. Past history is significant for anxiety sleep pattern disturbance and bilateral hip pain There are no active problems to display for this patient.   Past Surgical History:  Procedure Laterality Date  . ABDOMINAL HYSTERECTOMY    . KNEE SURGERY      Prior to Admission medications   Medication Sig Start Date End Date Taking? Authorizing Provider  cyclobenzaprine (FLEXERIL) 10 MG tablet Take 1 tablet by mouth 3 (three) times daily. 11/08/16  Yes [provider]  etodolac (LODINE) 500 MG tablet Take 1 tablet by mouth daily. 04/11/17  Yes [provider]  zolpidem (AMBIEN) 10 MG tablet Take 5 mg by mouth at bedtime. 03/08/17  Yes [provider]    Allergies Patient has no known allergies.  Family History  Problem Relation Age of Onset  . Lung cancer Mother   . Breast cancer Maternal Aunt      Social History Social History   Tobacco Use  . Smoking status: Never Smoker  . Smokeless tobacco: Never Used  Substance Use Topics  . Alcohol use: No    Frequency: Never  . Drug use: No    Review of Systems  Constitutional: No fever/chills Eyes: No visual changes. ENT: No sore throat. Cardiovascular: Denies chest pain. Respiratory: Denies shortness of breath. Gastrointestinal: See HPI Genitourinary: See HPI Musculoskeletal: Negative for back pain. Skin: Negative for rash. Neurological: Negative for headaches, focal weakness   ____________________________________________   PHYSICAL EXAM:  VITAL SIGNS: ED Triage Vitals  Enc Vitals Group     BP --      Pulse --      Resp --      Temp --      Temp src --      SpO2 --      Weight 04/28/17 0853 183 lb (83 kg)     Height 04/28/17 0853 5\' 7"  (1.702 m)     Head Circumference --      Peak Flow --      Pain Score 04/28/17 0852 2     Pain Loc --      Pain Edu? --      Excl. in [place]? --     Constitutional: Alert and oriented. Well appearing and in no acute distress. Eyes: Conjunctivae are normal.  Head: Atraumatic. Nose:  No congestion/rhinnorhea. Mouth/Throat: Mucous membranes are moist.  Oropharynx non-erythematous. Neck: No stridor.   Cardiovascular: Normal rate, regular rhythm. Grossly normal heart sounds.  Good peripheral circulation. Respiratory: Normal respiratory effort.  No retractions. Lungs CTAB. Gastrointestinal: Soft mild diffuse tenderness below the umbilicus no distention. No abdominal bruits. No CVA tenderness. Musculoskeletal: No lower extremity tenderness nor edema.  No joint effusions. Neurologic:  Normal speech and language. No gross focal neurologic deficits are appreciated.  Skin:  Skin is warm, dry and intact. No rash noted. Psychiatric: Mood and affect are normal. Speech and behavior are normal.  ____________________________________________   LABS (all labs ordered are listed, but only  abnormal results are displayed)  Labs Reviewed  URINALYSIS, COMPLETE (UACMP) WITH MICROSCOPIC - Abnormal; Notable for the following components:      Result Value   Color, Urine AMBER (*)    APPearance HAZY (*)    Specific Gravity, Urine 1.039 (*)    Bilirubin Urine MODERATE (*)    Ketones, ur 5 (*)    Protein, ur 100 (*)    Squamous Epithelial / LPF 0-5 (*)    All other components within normal limits  COMPREHENSIVE METABOLIC PANEL - Abnormal; Notable for the following components:   Glucose, Bld 113 (*)    BUN 21 (*)    Creatinine, Ser 1.14 (*)    Calcium 8.5 (*)    Albumin 2.7 (*)    Alkaline Phosphatase 135 (*)    GFR calc non Af Amer 50 (*)    GFR calc Af Amer 58 (*)    All other components within normal limits  LIPASE, BLOOD  CBC WITH DIFFERENTIAL/PLATELET   ____________________________________________  EKG  Pending ____________________________________________  RADIOLOGY  CT shows diverticulitis with 2 abscesses largest of which is 6 cm across ____________________________________________   PROCEDURES  Procedure(s) performed:   Procedures  Critical Care performed:   ____________________________________________   INITIAL IMPRESSION / ASSESSMENT AND PLAN / ED COURSE Discussed patient with Dr. 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    Nena Polio, MD 04/28/17 [phone]

## 2017-04-29 LAB — BASIC METABOLIC PANEL
Anion gap: 9 (ref 5–15)
BUN: 11 mg/dL (ref 6–20)
CALCIUM: 8.2 mg/dL — AB (ref 8.9–10.3)
CO2: 25 mmol/L (ref 22–32)
CREATININE: 1.05 mg/dL — AB (ref 0.44–1.00)
Chloride: 101 mmol/L (ref 101–111)
GFR calc Af Amer: >60 mL/min (ref >60)
GFR, EST NON AFRICAN AMERICAN: 55 mL/min — AB (ref >60)
GLUCOSE: 173 mg/dL — AB (ref 65–99)
POTASSIUM: 3.6 mmol/L (ref 3.5–5.1)
Sodium: 135 mmol/L (ref 135–145)

## 2017-04-29 LAB — CBC
HCT: 35.8 % (ref 35.0–47.0)
Hemoglobin: 11.9 g/dL — ABNORMAL LOW (ref 12.0–16.0)
MCH: 28.1 pg (ref 26.0–34.0)
MCHC: 33.3 g/dL (ref 32.0–36.0)
MCV: 84.5 fL (ref 80.0–100.0)
PLATELETS: 512 10*3/uL — AB (ref 150–440)
RBC: 4.24 MIL/uL (ref 3.80–5.20)
RDW: 14.5 % (ref 11.5–14.5)
WBC: 18.8 10*3/uL — ABNORMAL HIGH (ref 3.6–11.0)

## 2017-04-29 MED ORDER — MORPHINE SULFATE (PF) 2 MG/ML IV SOLN
2.0000 mg | INTRAVENOUS | Status: DC | PRN
  Administered 2017-04-30 – 2017-05-01 (×5): 2 mg via INTRAVENOUS
  Filled 2017-04-29 (×5): qty 1

## 2017-04-29 MED ORDER — ACETAMINOPHEN 500 MG PO TABS
1000.0000 mg | ORAL_TABLET | Freq: Four times a day (QID) | ORAL | Status: DC | PRN
  Administered 2017-04-29 – 2017-04-30 (×2): 1000 mg via ORAL
  Filled 2017-04-29 (×2): qty 2

## 2017-04-29 MED ORDER — OXYCODONE HCL 5 MG PO TABS
5.0000 mg | ORAL_TABLET | ORAL | Status: DC | PRN
  Administered 2017-04-30 (×2): 5 mg via ORAL
  Administered 2017-05-01 (×2): 10 mg via ORAL
  Administered 2017-05-02: 22:00:00 5 mg via ORAL
  Administered 2017-05-04: 10 mg via ORAL
  Filled 2017-04-29 (×2): qty 2
  Filled 2017-04-29: qty 1
  Filled 2017-04-29: qty 2
  Filled 2017-04-29 (×2): qty 1

## 2017-04-29 NOTE — Progress Notes (Signed)
CC: Acute diverticulitis with pelvic abscess Subjective: Patient states she feels better today and has had no fevers.  She did have a temp of 99.9 last night.  She has no nausea vomiting fevers or chills otherwise.  She has had her drainage procedure performed.  Objective: Vital signs in last 24 hours: Temp:  [98.4 F (36.9 C)-99.9 F (37.7 C)] 99 F (37.2 C) (12/29 0458) Pulse Rate:  [72-98] 72 (12/29 0458) Resp:  [12-25] 20 (12/29 0458) BP: (119-147)/(55-82) 126/74 (12/29 0458) SpO2:  [94 %-100 %] 94 % (12/29 0458) Last BM Date: 04/28/17  Intake/Output from previous day: 12/28 0701 - 12/29 0700 In: 1553.3 [P.O.:120; I.V.:1233.3; IV Piggyback:200] Out: 40  Intake/Output this shift: No intake/output data recorded.  Physical exam:  Vital signs reviewed 29 9 is the T-max Abdomen is soft nondistended nontympanitic and essentially nontender drain is present in the left buttock.  Serous fluid only  Lab Results: CBC  Recent Labs    04/28/17 1230 04/29/17 0405  WBC 19.5* 18.8*  HGB 14.1 11.9*  HCT 43.9 35.8  PLT 515* 512*   BMET Recent Labs    04/28/17 0923 04/29/17 0405  NA 137 135  K 3.7 3.6  CL 104 101  CO2 27 25  GLUCOSE 113* 173*  BUN 21* 11  CREATININE 1.14* 1.05*  CALCIUM 8.5* 8.2*   PT/INR Recent Labs    04/28/17 1644  LABPROT 13.7  INR 1.06   ABG No results for input(s): PHART, HCO3 in the last 72 hours.  Invalid input(s): PCO2, PO2  Studies/Results: Ct Abdomen Pelvis W Contrast  Result Date: 04/28/2017 CLINICAL DATA:  Persistent UTIs with abdominal pain, initial encounter EXAM: CT ABDOMEN AND PELVIS WITH CONTRAST TECHNIQUE: Multidetector CT imaging of the abdomen and pelvis was performed using the standard protocol following bolus administration of intravenous contrast. CONTRAST:  145mL ISOVUE-300 IOPAMIDOL (ISOVUE-300) INJECTION 61% COMPARISON:  None. FINDINGS: Lower chest: Minimal bibasilar atelectatic changes are noted. Hepatobiliary: No  focal liver abnormality is seen. No gallstones, gallbladder wall thickening, or biliary dilatation. Pancreas: Unremarkable. No pancreatic ductal dilatation or surrounding inflammatory changes. Spleen: Normal in size without focal abnormality. Adrenals/Urinary Tract: The adrenal glands are within normal limits bilaterally. The bladder is well distended. The collecting systems are mildly prominent bilaterally likely related to distal ureteral compression. No ureteral calculi are seen. No renal calculi are noted. Small parapelvic cysts are noted as well. Stomach/Bowel: The appendix is well visualized and within normal limits. Extensive diverticular change is noted within the colon particularly in the sigmoid colon where extensive diverticulitis is noted. Pericolonic inflammatory changes noted in there are at least 2 extraluminal air fluid collections identified. One of these lies just superior to the sigmoid colon and measures approximately 6.1 by 4.0 cm. The second lies somewhat more superiorly adjacent to the terminal ileum and measures approximately 4.2 cm in greatest dimension. A smaller 15 mm air-fluid collection is noted interposed between the two larger collections and may represent an area of communication between the collections. Considerable thickening of the distal small bowel wall is noted with edema. This is likely reactive in nature although the possibility of an intrinsic small-bowel abnormality could not be totally excluded on this exam. The appendix is within normal limits. Some free fluid is noted within the abdomen and pelvis in addition to the previously described air-fluid collections. Vascular/Lymphatic: No significant vascular findings are present. No enlarged abdominal or pelvic lymph nodes. Reproductive: Status post hysterectomy. No adnexal masses. Other: Free fluid is noted  as described above. No hernia is identified. Musculoskeletal: Mild degenerative changes of the lumbar spine are noted.  IMPRESSION: Changes most consistent with diverticulitis with multiple air-fluid collections surrounding the sigmoid colon as described. There is a smaller interposing air-fluid collection between the two larger collections likely representing some intercommunication between the two. Small bowel inflammatory changes noted involving the distal ileum. This is likely reactive in nature although the possibility of intrinsic small bowel disease could not be totally excluded on the basis of this exam. No evidence of fistulization to the urinary bladder is noted. Some mild fullness of the collecting systems is seen related to distal ureteral compression. Electronically Signed   By: Inez Catalina M.D.   On: 04/28/2017 11:44   Ct Image Guided Drainage Percut Cath  Peritoneal Retroperit  Result Date: 04/28/2017 INDICATION: Diverticular abscess. Please perform CT-guided percutaneous drainage catheter placement for infection source control purposes. EXAM: CT-GUIDED LEFT TRANS GLUTEAL APPROACH PERCUTANEOUS DRAINAGE CATHETER PLACEMENT COMPARISON:  CT of the abdomen and pelvis - earlier same day MEDICATIONS: The patient is currently admitted to the hospital and receiving intravenous antibiotics. The antibiotics were administered within an appropriate time frame prior to the initiation of the procedure. ANESTHESIA/SEDATION: Moderate (conscious) sedation was employed during this procedure. A total of Versed 4 mg and Fentanyl 100 mcg was administered intravenously. Moderate Sedation Time: 24 minutes. The patient's level of consciousness and vital signs were monitored continuously by radiology nursing throughout the procedure under my direct supervision. CONTRAST:  None COMPLICATIONS: None immediate. PROCEDURE: Informed written consent was obtained from the patient after a discussion of the risks, benefits and alternatives to treatment. The patient was placed prone on the CT gantry and a pre procedural CT was performed  re-demonstrating the known abscess/fluid collection within the lower pelvis with dominant component measuring approximately 6.5 x 4.5 cm (image 30, series 2). The procedure was planned. A timeout was performed prior to the initiation of the procedure. The skin overlying the left buttocks was prepped and draped in the usual sterile fashion. The overlying soft tissues were anesthetized with 1% lidocaine with epinephrine. Appropriate trajectory was planned with the use of a 22 gauge spinal needle. An 18 gauge trocar needle was advanced into the abscess/fluid collection and a short Amplatz super stiff wire was coiled within the collection. Appropriate positioning was confirmed with a limited CT scan. The tract was serially dilated allowing placement of a 10 [place] all-purpose drainage catheter. Appropriate positioning was confirmed with a limited postprocedural CT scan. Approximately 25 Ml of purulent fluid was aspirated. The tube was connected to a drainage bag and sutured in place. A dressing was placed. The patient tolerated the procedure well without immediate post procedural complication. IMPRESSION: Successful CT guided placement of a 10 French all purpose drain catheter into the lower pelvis via left trans gluteal approach with aspiration of 25 mL of purulent fluid. Samples were sent to the laboratory as requested by the ordering clinical team. Electronically Signed   By: Sandi Mariscal M.D.   On: 04/28/2017 16:58    Anti-infectives: Anti-infectives (From admission, onward)   Start     Dose/Rate Route Frequency Ordered Stop   04/28/17 1800  Ampicillin-Sulbactam (UNASYN) 3 g in sodium chloride 0.9 % 100 mL IVPB  Status:  Discontinued     3 g 200 mL/hr over 30 Minutes Intravenous Every 6 hours 04/28/17 1314 04/28/17 1334   04/28/17 1400  piperacillin-tazobactam (ZOSYN) IVPB 3.375 g     3.375 g 12.5 mL/hr over  240 Minutes Intravenous Every 8 hours 04/28/17 1334     04/28/17 1230  Ampicillin-Sulbactam  (UNASYN) 3 g in sodium chloride 0.9 % 100 mL IVPB     3 g 200 mL/hr over 30 Minutes Intravenous STAT 04/28/17 1213 04/28/17 1331   04/28/17 1215  metroNIDAZOLE (FLAGYL) IVPB 500 mg     500 mg 100 mL/hr over 60 Minutes Intravenous  Once 04/28/17 1209 04/28/17 1435      Assessment/Plan:  This a patient with acute diverticulitis.  She has had drainage of a pelvic abscess with a CT guidance.  We will continue current therapy with IV antibiotics and may require repeat CT as there were multiple abscesses on the initial CT and it is unclear how many of these communicate with the drained cavity.  She will also require a colonoscopy at some point prior to elective resection.  Florene Glen, MD, FACS  04/29/2017

## 2017-04-30 DIAGNOSIS — K5720 Diverticulitis of large intestine with perforation and abscess without bleeding: Principal | ICD-10-CM

## 2017-04-30 LAB — CBC WITH DIFFERENTIAL/PLATELET
BASOS PCT: 1 %
Basophils Absolute: 0.1 10*3/uL (ref 0–0.1)
EOS ABS: 0.3 10*3/uL (ref 0–0.7)
EOS PCT: 2 %
HCT: 36.9 % (ref 35.0–47.0)
HEMOGLOBIN: 12.2 g/dL (ref 12.0–16.0)
LYMPHS ABS: 2 10*3/uL (ref 1.0–3.6)
Lymphocytes Relative: 13 %
MCH: 28.2 pg (ref 26.0–34.0)
MCHC: 33.1 g/dL (ref 32.0–36.0)
MCV: 85.2 fL (ref 80.0–100.0)
MONOS PCT: 8 %
Monocytes Absolute: 1.2 10*3/uL — ABNORMAL HIGH (ref 0.2–0.9)
NEUTROS PCT: 76 %
Neutro Abs: 12.3 10*3/uL — ABNORMAL HIGH (ref 1.4–6.5)
PLATELETS: 472 10*3/uL — AB (ref 150–440)
RBC: 4.33 MIL/uL (ref 3.80–5.20)
RDW: 14.4 % (ref 11.5–14.5)
WBC: 15.9 10*3/uL — AB (ref 3.6–11.0)

## 2017-04-30 NOTE — Progress Notes (Signed)
Initial Nutrition Assessment  DOCUMENTATION CODES:   Obesity unspecified  INTERVENTION:   Recommend Premier Protein BID, each supplement provides 160 kcal and 30 grams of protein.   Recommend daily MVI  NUTRITION DIAGNOSIS:   Increased nutrient needs related to wound healing as evidenced by increased estimated needs from protein.  GOAL:   Patient will meet greater than or equal to 90% of their needs  MONITOR:   PO intake, Supplement acceptance, Weight trends, Labs, Skin  REASON FOR ASSESSMENT:   Malnutrition Screening Tool    ASSESSMENT:   61 y.o. female admitted on 04/28/2017 with Intra abdominal infection/diverticular abscess.    Pt s/p CT guided placed of a 10 Fr drainage catheter placement into the lower pelvis via L TG approach yielding 25 cc of purulent material 12/28  Pt feeling better today; eating 25% of her full liquid diet. Per chart, pt weight stable pta. Recommend Premier Protein and MVI to encourage wound healing.    Medications reviewed and include: heparin, LRS w/ 5% dextrose @75ml /hr, zosyn, morphine, oxycodone  Labs reviewed: creat 1.05(H), Ca 8.2(L) wbc- 15.9(H)  Nutrition-Focused physical exam completed. Findings are no fat depletion, no muscle depletion, and no edema.   Diet Order:  Diet full liquid Room service appropriate? Yes; Fluid consistency: Thin  EDUCATION NEEDS:   No education needs have been identified at this time  Skin:  Skin Assessment: (incision buttocks)  Last BM:  12/28  Height:   Ht Readings from Last 1 Encounters:  04/28/17 5\' 7"  (1.702 m)    Weight:   Wt Readings from Last 1 Encounters:  04/28/17 183 lb (83 kg)    Ideal Body Weight:  61.36 kg  BMI:  Body mass index is 28.66 kg/m.  Estimated Nutritional Needs:   Kcal:  1700-2000kcal/day   Protein:  83-100g/day   Fluid:  >1.7L/day   Koleen Distance MS, RD, LDN Pager #[phone] After Hours Pager: [phone]

## 2017-04-30 NOTE — Progress Notes (Signed)
SURGICAL PROGRESS NOTE (cpt [phone])  Hospital Day(s): 2.   Post op day(s):  Marland Kitchen   Interval History: Patient seen and examined, no acute events or new complaints overnight. Patient's last low-grade fever (100.55F) was yesterday afternoon, and she reports mild-/moderate- "bloating" with slowly improving but still very limited appetite, yet denies any abdominal pain, N/V, subjective fever/chills, CP, or SOB. She says she has been walking, but mostly in her room. She also describes a chronic history (decades) of constipation, for which she's not sought treatment due to limited symptoms, and she says her last colonoscopy was at least 12 years ago.  Review of Systems:  Constitutional: denies fever, chills  HEENT: denies cough or congestion  Respiratory: denies any shortness of breath  Cardiovascular: denies chest pain or palpitations  Gastrointestinal: abdominal pain, N/V, and bowel function as per interval history Genitourinary: denies burning with urination or urinary frequency Musculoskeletal: denies pain, decreased motor or sensation Integumentary: denies any other rashes or skin discolorations except post-pelvic abscess drainage wound Neurological: denies HA or vision/hearing changes   Vital signs in last 24 hours: [min-max] current  Temp:  [98.2 F (36.8 C)-100.4 F (38 C)] 98.4 F (36.9 C) (12/30 0430) Pulse Rate:  [62-78] 62 (12/30 0430) Resp:  [20] 20 (12/30 0430) BP: (130-149)/(69-75) 130/75 (12/30 0430) SpO2:  [95 %-99 %] 99 % (12/30 0430)     Height: 5\' 7"  (170.2 cm) Weight: 183 lb (83 kg) BMI (Calculated): 28.66   Intake/Output this shift:  No intake/output data recorded.   Intake/Output last 2 shifts:  @IOLAST2SHIFTS @   Physical Exam:  Constitutional: alert, cooperative and no distress  HENT: normocephalic without obvious abnormality  Eyes: PERRL, EOM's grossly intact and symmetric  Neuro: CN II - XII grossly intact and symmetric without deficit  Respiratory: breathing  non-labored at rest  Cardiovascular: regular rate and sinus rhythm  Gastrointestinal: soft, completely non-tender, and minimally distended, percutaneous drain well-secured to gravity bag with a small amount of serosanguinous non-purulent fluid in bag and drainage tube/catheter Musculoskeletal: UE and LE FROM, no edema or wounds, motor and sensation grossly intact, NT   Labs:  CBC Latest Ref Rng & Units 04/30/2017 04/29/2017 04/28/2017  WBC 3.6 - 11.0 K/uL 15.9(H) 18.8(H) 19.5(H)  Hemoglobin 12.0 - 16.0 g/dL 12.2 11.9(L) 14.1  Hematocrit 35.0 - 47.0 % 36.9 35.8 43.9  Platelets 150 - 440 K/uL 472(H) 512(H) 515(H)   CMP Latest Ref Rng & Units 04/29/2017 04/28/2017  Glucose 65 - 99 mg/dL 173(H) 113(H)  BUN 6 - 20 mg/dL 11 21(H)  Creatinine 0.44 - 1.00 mg/dL 1.05(H) 1.14(H)  Sodium 135 - 145 mmol/L 135 137  Potassium 3.5 - 5.1 mmol/L 3.6 3.7  Chloride 101 - 111 mmol/L 101 104  CO2 22 - 32 mmol/L 25 27  Calcium 8.9 - 10.3 mg/dL 8.2(L) 8.5(L)  Total Protein 6.5 - 8.1 g/dL - 6.7  Total Bilirubin 0.3 - 1.2 mg/dL - 1.0  Alkaline Phos 38 - 126 U/L - 135(H)  AST 15 - 41 U/L - 29  ALT 14 - 54 U/L - 36   Imaging studies: No new pertinent imaging studies   Assessment/Plan: (ICD-10's: K37.20) 61 y.o. 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   - continue IV antibiotics  - follow-up and trend  am WBC  - monitor abdominal exam and bowel function  - likely advance to low-fiber (soft) diet tomorrow  - repeat CT prior to discharge considering potentially undrained abscesses  - discharge planning (~Wednesday) pending leukocytosis, advancement of diet, and CT  - medical management of  comorbidities (home medications)  - DVT prophylaxis, ambulation encouraged  All of the above findings and recommendations were discussed with the patient and patient's husband, and all of patient's and family's questions were answered to their expressed satisfaction.  -- Marilynne Drivers Rosana Hoes, MD, [place]: [facility] - Partnering for exceptional care. Office: [phone]

## 2017-05-01 LAB — HIV ANTIBODY (ROUTINE TESTING W REFLEX): HIV Screen 4th Generation wRfx: NONREACTIVE

## 2017-05-01 LAB — BODY FLUID CULTURE

## 2017-05-01 MED ORDER — ENSURE ENLIVE PO LIQD
237.0000 mL | Freq: Three times a day (TID) | ORAL | Status: DC
  Administered 2017-05-02 (×3): 237 mL via ORAL

## 2017-05-01 NOTE — Plan of Care (Signed)
  Progressing Education: Knowledge of General Education information will improve 05/01/2017 1720 - Progressing by Rowe Robert, RN Health Behavior/Discharge Planning: Ability to manage health-related needs will improve 05/01/2017 1720 - Progressing by Rowe Robert, RN Clinical Measurements: Ability to maintain clinical measurements within normal limits will improve 05/01/2017 1720 - Progressing by Rowe Robert, RN Will remain free from infection 05/01/2017 1720 - Progressing by Rowe Robert, RN Diagnostic test results will improve 05/01/2017 1720 - Progressing by Rowe Robert, RN Respiratory complications will improve 05/01/2017 1720 - Progressing by Rowe Robert, RN Cardiovascular complication will be avoided 05/01/2017 1720 - Progressing by Rowe Robert, RN Activity: Risk for activity intolerance will decrease 05/01/2017 1720 - Progressing by Rowe Robert, RN Nutrition: Adequate nutrition will be maintained 05/01/2017 1720 - Progressing by Rowe Robert, RN Coping: Level of anxiety will decrease 05/01/2017 1720 - Progressing by Rowe Robert, RN Elimination: Will not experience complications related to bowel motility 05/01/2017 1720 - Progressing by Rowe Robert, RN Will not experience complications related to urinary retention 05/01/2017 1720 - Progressing by Rowe Robert, RN Pain Managment: General experience of comfort will improve 05/01/2017 1720 - Progressing by Rowe Robert, RN Safety: Ability to remain free from injury will improve 05/01/2017 1720 - Progressing by Rowe Robert, RN Skin Integrity: Risk for impaired skin integrity will decrease 05/01/2017 1720 - Progressing by Rowe Robert, RN

## 2017-05-01 NOTE — Progress Notes (Signed)
SURGICAL PROGRESS NOTE (cpt [phone])  Hospital Day(s): 3.   Post op day(s):  Marland Kitchen   Interval History: Patient seen and examined, no acute events or new complaints overnight. Patient reports her pain continues to decrease, while her appetite continues to improve. She otherwise reports +flatus and +BM, denies N/V, fever/chills, CP, or SOB.  Review of Systems:  Constitutional: denies fever, chills  HEENT: denies cough or congestion  Respiratory: denies any shortness of breath  Cardiovascular: denies chest pain or palpitations  Gastrointestinal: abdominal pain, N/V, and bowel function as per interval history Genitourinary: denies burning with urination or urinary frequency Musculoskeletal: denies pain, decreased motor or sensation Integumentary: denies any other rashes or skin discolorations except post-pelvic abscess drain placement Neurological: denies HA or vision/hearing changes   Vital signs in last 24 hours: [min-max] current  Temp:  [97.5 F (36.4 C)-97.9 F (36.6 C)] 97.5 F (36.4 C) (12/31 0540) Pulse Rate:  [63-74] 63 (12/31 0540) Resp:  [16-20] 16 (12/31 0540) BP: (115-134)/(58-69) 115/58 (12/31 0540) SpO2:  [96 %-99 %] 96 % (12/31 0540)     Height: 5\' 7"  (170.2 cm) Weight: 183 lb (83 kg) BMI (Calculated): 28.66   Intake/Output this shift:  No intake/output data recorded.   Intake/Output last 2 shifts:  @IOLAST2SHIFTS @   Physical Exam:  Constitutional: alert, cooperative and no distress  HENT: normocephalic without obvious abnormality  Eyes: PERRL, EOM's grossly intact and symmetric  Neuro: CN II - XII grossly intact and symmetric without deficit  Respiratory: breathing non-labored at rest  Cardiovascular: regular rate and sinus rhythm  Gastrointestinal: soft, mild-/minimal- LLQ tenderness to palpation, and non-distended, drain well-secured with small volume of serosanguinous fluid in gravity bag and tubing Musculoskeletal: UE and LE FROM, no edema or wounds, motor  and sensation grossly intact, NT   Labs:  CBC Latest Ref Rng & Units 04/30/2017 04/29/2017 04/28/2017  WBC 3.6 - 11.0 K/uL 15.9(H) 18.8(H) 19.5(H)  Hemoglobin 12.0 - 16.0 g/dL 12.2 11.9(L) 14.1  Hematocrit 35.0 - 47.0 % 36.9 35.8 43.9  Platelets 150 - 440 K/uL 472(H) 512(H) 515(H)   CMP Latest Ref Rng & Units 04/29/2017 04/28/2017  Glucose 65 - 99 mg/dL 173(H) 113(H)  BUN 6 - 20 mg/dL 11 21(H)  Creatinine 0.44 - 1.00 mg/dL 1.05(H) 1.14(H)  Sodium 135 - 145 mmol/L 135 137  Potassium 3.5 - 5.1 mmol/L 3.6 3.7  Chloride 101 - 111 mmol/L 101 104  CO2 22 - 32 mmol/L 25 27  Calcium 8.9 - 10.3 mg/dL 8.2(L) 8.5(L)  Total Protein 6.5 - 8.1 g/dL - 6.7  Total Bilirubin 0.3 - 1.2 mg/dL - 1.0  Alkaline Phos 38 - 126 U/L - 135(H)  AST 15 - 41 U/L - 29  ALT 14 - 54 U/L - 36   Imaging studies: No new pertinent imaging studies   Assessment/Plan: (ICD-10's: K37.20) 61 y.o. 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              - continue IV antibiotics             - follow-up and trend am WBC             - monitor abdominal exam and bowel function             -  likely advance to low-fiber (soft) diet tomorrow             - repeat CT prior to discharge considering potentially undrained abscesses             - discharge planning (~Wednesday) pending leukocytosis, advancement of diet, and CT             - medical management of comorbidities (home medications)             - DVT prophylaxis, ambulation encouraged  All of the above findings and recommendations were discussed with the patient and patient's husband, and all of patient's and family's questions were answered to their expressed  satisfaction.  -- Marilynne Drivers Rosana Hoes, MD, [place]: [facility] - Partnering for exceptional care. Office: [phone]

## 2017-05-02 LAB — CBC
HEMATOCRIT: 36.8 % (ref 35.0–47.0)
HEMOGLOBIN: 12.2 g/dL (ref 12.0–16.0)
MCH: 28.7 pg (ref 26.0–34.0)
MCHC: 33.2 g/dL (ref 32.0–36.0)
MCV: 86.4 fL (ref 80.0–100.0)
Platelets: 475 10*3/uL — ABNORMAL HIGH (ref 150–440)
RBC: 4.26 MIL/uL (ref 3.80–5.20)
RDW: 14.4 % (ref 11.5–14.5)
WBC: 10.4 10*3/uL (ref 3.6–11.0)

## 2017-05-02 NOTE — Progress Notes (Signed)
SURGICAL PROGRESS NOTE (cpt [phone])  Hospital Day(s): 4.   Post op day(s):  Marland Kitchen   Interval History: Patient seen and examined, no acute events or new complaints overnight. Patient today denies any pain, while her appetite continues to slowly improve and has now been drinking a nutritional supplement shake with meals. She otherwise reports +flatus and +BM, denies N/V, fever/chills, CP, or SOB.  Review of Systems:  Constitutional: denies fever, chills  HEENT: denies cough or congestion  Respiratory: denies any shortness of breath  Cardiovascular: denies chest pain or palpitations  Gastrointestinal: abdominal pain, N/V, and bowel function as per interval history Genitourinary: denies burning with urination or urinary frequency Musculoskeletal: denies pain, decreased motor or sensation Integumentary: denies any other rashes or skin discolorations except post-percutaneous drain placement for pelvic abscess Neurological: denies HA or vision/hearing changes   Vital signs in last 24 hours: [min-max] current  Temp:  [97.3 F (36.3 C)-98.5 F (36.9 C)] 97.3 F (36.3 C) (01/01 0454) Pulse Rate:  [65-72] 71 (01/01 0454) Resp:  [16] 16 (01/01 0454) BP: (102-114)/(55-61) 102/55 (01/01 0454) SpO2:  [91 %-100 %] 91 % (01/01 0454)     Height: 5\' 7"  (170.2 cm) Weight: 183 lb (83 kg) BMI (Calculated): 28.66   Intake/Output this shift:  Total I/O In: 1725.8 [I.V.:1675.8; IV Piggyback:50] Out: -    Intake/Output last 2 shifts:  @IOLAST2SHIFTS @   Physical Exam:  Constitutional: alert, cooperative and no distress  HENT: normocephalic without obvious abnormality  Eyes: PERRL, EOM's grossly intact and symmetric  Neuro: CN II - XII grossly intact and symmetric without deficit  Respiratory: breathing non-labored at rest  Cardiovascular: regular rate and sinus rhythm  Gastrointestinal: soft, non-tender, and non-distended with drain well-secured with only a small volume of serosanguinous fluid in  drainage gravity bag and tubing Musculoskeletal: UE and LE FROM, no edema or wounds, motor and sensation grossly intact, NT   Labs:  CBC Latest Ref Rng & Units 05/02/2017 04/30/2017 04/29/2017  WBC 3.6 - 11.0 K/uL 10.4 15.9(H) 18.8(H)  Hemoglobin 12.0 - 16.0 g/dL 12.2 12.2 11.9(L)  Hematocrit 35.0 - 47.0 % 36.8 36.9 35.8  Platelets 150 - 440 K/uL 475(H) 472(H) 512(H)   CMP Latest Ref Rng & Units 04/29/2017 04/28/2017  Glucose 65 - 99 mg/dL 173(H) 113(H)  BUN 6 - 20 mg/dL 11 21(H)  Creatinine 0.44 - 1.00 mg/dL 1.05(H) 1.14(H)  Sodium 135 - 145 mmol/L 135 137  Potassium 3.5 - 5.1 mmol/L 3.6 3.7  Chloride 101 - 111 mmol/L 101 104  CO2 22 - 32 mmol/L 25 27  Calcium 8.9 - 10.3 mg/dL 8.2(L) 8.5(L)  Total Protein 6.5 - 8.1 g/dL - 6.7  Total Bilirubin 0.3 - 1.2 mg/dL - 1.0  Alkaline Phos 38 - 126 U/L - 135(H)  AST 15 - 41 U/L - 29  ALT 14 - 54 U/L - 36   Imaging studies: No new pertinent imaging studies   Assessment/Plan:(ICD-10's: K57.20) 62 y.o.femalewith improving acute sigmoid colonic diverticulitis withmultiplepelvic abscessess/p image-guided percutaneous drainage of the largest (one of three) abscesses and resolved leukocytosis, complicated by pertinent comorbidities includingchronic unmanaged constipation, a history of abdominal hysterectomy, and a maternal history of adenocarcinoma to lungs with uncertain primary tumor.  - continue IV antibiotics  - soft diet with PO nutritional supplements - monitor abdominal exam and bowel function - medical management of comorbidities (home medications) - repeat CT tomorrow to evaluate for potentially undrained abscesses - discharge planning, tentatively for tomorrow pending CT - DVT prophylaxis, ambulation  encouraged  All of the above findings and recommendations were discussed with the patient, and all of patient's questions were answered to her expressed  satisfaction.  -- Marilynne Drivers Rosana Hoes, MD, [place]: [facility] - Partnering for exceptional care. Office: [phone]

## 2017-05-02 NOTE — Plan of Care (Signed)
  Progressing Education: Knowledge of General Education information will improve 05/02/2017 1453 - Progressing by Cherylann Parr, RN 05/02/2017 1452 - Progressing by Cherylann Parr, RN Health Behavior/Discharge Planning: Ability to manage health-related needs will improve 05/02/2017 1453 - Progressing by Cherylann Parr, RN 05/02/2017 1452 - Progressing by Cherylann Parr, RN Clinical Measurements: Ability to maintain clinical measurements within normal limits will improve 05/02/2017 1453 - Progressing by Cherylann Parr, RN 05/02/2017 1452 - Progressing by Cherylann Parr, RN Will remain free from infection 05/02/2017 1453 - Progressing by Cherylann Parr, RN 05/02/2017 1452 - Progressing by Cherylann Parr, RN Diagnostic test results will improve 05/02/2017 1453 - Progressing by Cherylann Parr, RN 05/02/2017 1452 - Progressing by Cherylann Parr, RN Respiratory complications will improve 05/02/2017 1453 - Progressing by Cherylann Parr, RN 05/02/2017 1452 - Progressing by Cherylann Parr, RN Cardiovascular complication will be avoided 05/02/2017 1453 - Progressing by Cherylann Parr, RN 05/02/2017 1452 - Progressing by Cherylann Parr, RN Activity: Risk for activity intolerance will decrease 05/02/2017 1453 - Progressing by Cherylann Parr, RN 05/02/2017 1452 - Progressing by Cherylann Parr, RN Nutrition: Adequate nutrition will be maintained 05/02/2017 1453 - Progressing by Cherylann Parr, RN 05/02/2017 1452 - Progressing by Cherylann Parr, RN Coping: Level of anxiety will decrease 05/02/2017 1453 - Progressing by Cherylann Parr, RN 05/02/2017 1452 - Progressing by Cherylann Parr, RN Elimination: Will not experience complications related to bowel motility 05/02/2017 1453 - Progressing by Cherylann Parr, RN 05/02/2017 1452 - Progressing by Cherylann Parr, RN Will not experience complications related to urinary retention 05/02/2017 1453 - Progressing by Cherylann Parr, RN 05/02/2017 1452 - Progressing by  Cherylann Parr, RN Pain Managment: General experience of comfort will improve 05/02/2017 1453 - Progressing by Cherylann Parr, RN 05/02/2017 1452 - Progressing by Cherylann Parr, RN Safety: Ability to remain free from injury will improve 05/02/2017 1453 - Progressing by Cherylann Parr, RN 05/02/2017 1452 - Progressing by Cherylann Parr, RN Skin Integrity: Risk for impaired skin integrity will decrease 05/02/2017 1453 - Progressing by Cherylann Parr, RN 05/02/2017 1452 - Progressing by Cherylann Parr, RN

## 2017-05-03 ENCOUNTER — Inpatient Hospital Stay: Payer: Managed Care, Other (non HMO)

## 2017-05-03 IMAGING — CT CT ABD-PELV W/ CM
2 of 5 series · 15 of 46 positions shown, 17 images · IV contrast (APPLIED)
Comparison: CT scan dated [DATE]

CLINICAL DATA: Diverticular abscess.  Abdominal pain and fever.

EXAM:
CT ABDOMEN AND PELVIS WITH CONTRAST
TECHNIQUE: Multidetector CT imaging of the abdomen and pelvis was performed
using the standard protocol following bolus administration of
intravenous contrast.
CONTRAST:  100mL [XO] IOPAMIDOL ([XO]) INJECTION 61%

[Series 2: axial st · axial · 0.85mm/px · z∈[-952,-472]mm · 12 of 108 slices shown, 14 images]
[im 6/108  soft-tissue]
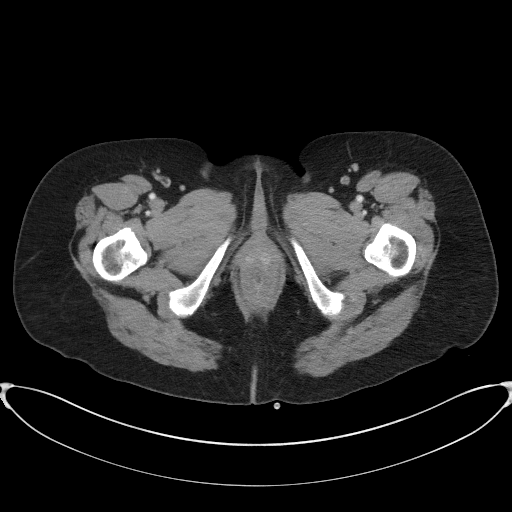
[im 6/108  bone]
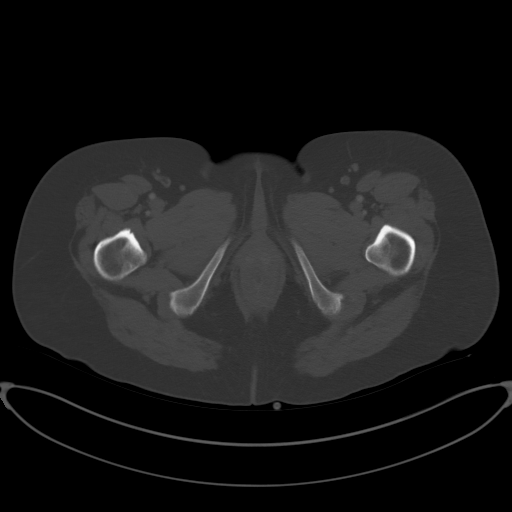
[im 18/108  soft-tissue]
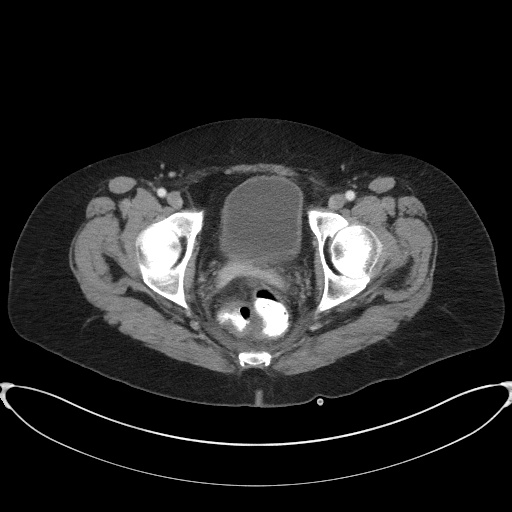
[im 24/108  soft-tissue]
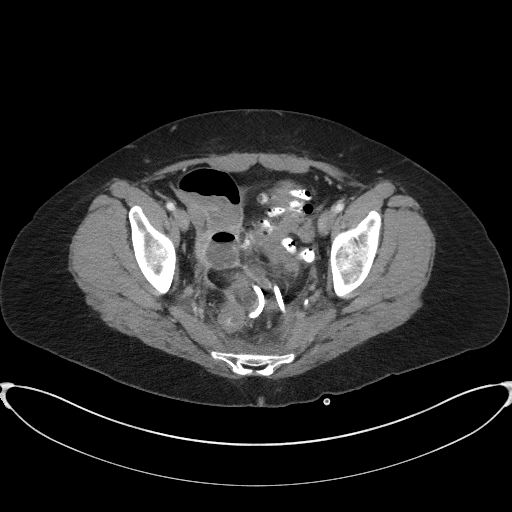
[im 30/108  soft-tissue]
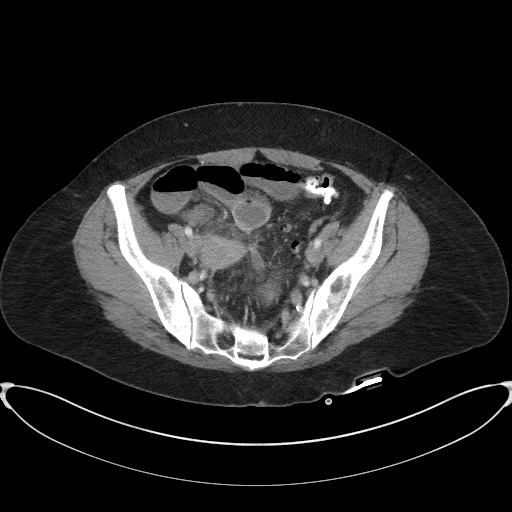
[im 42/108  soft-tissue]
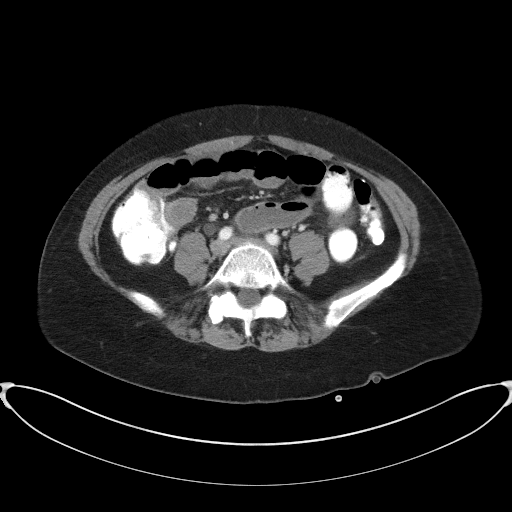
[im 48/108  soft-tissue]
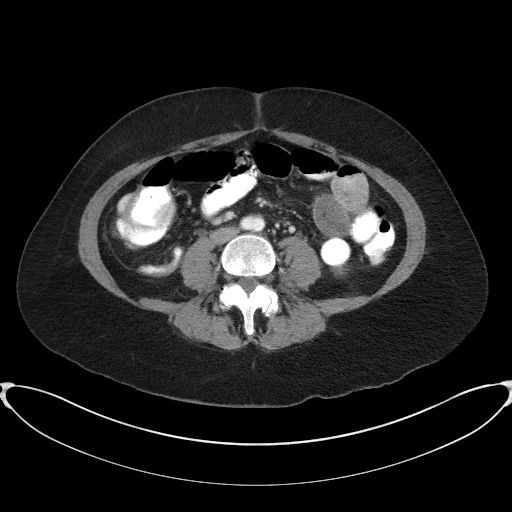
[im 60/108  soft-tissue]
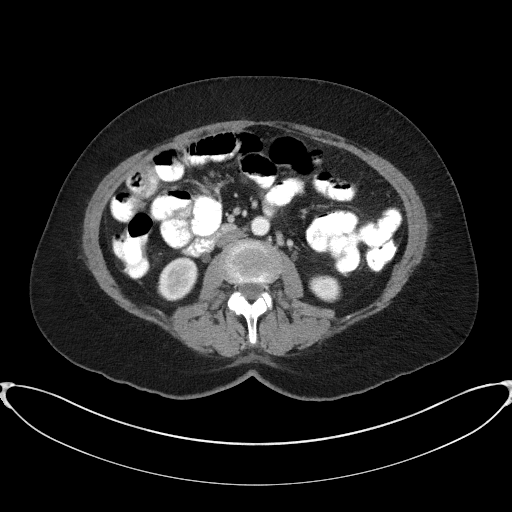
[im 66/108  soft-tissue]
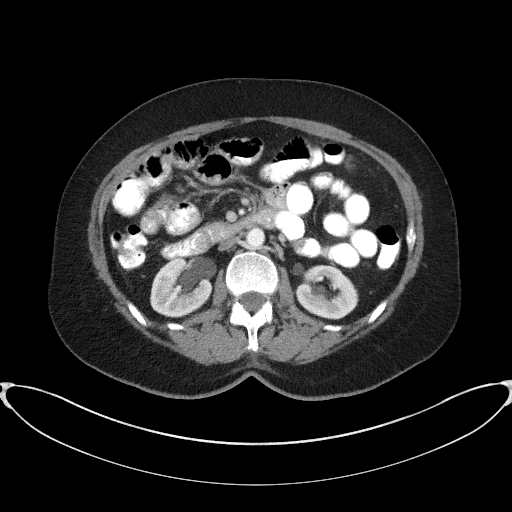
[im 78/108  soft-tissue]
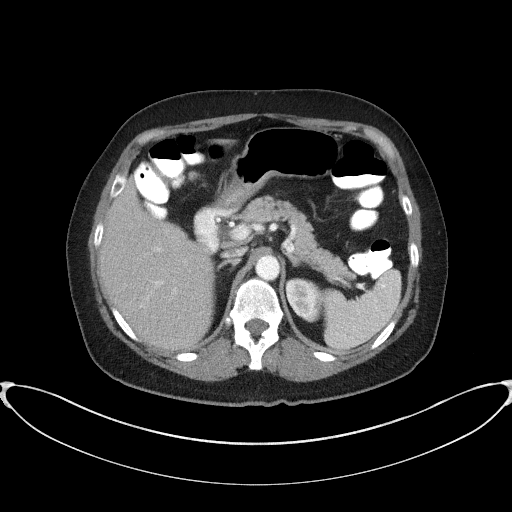
[im 78/108  bone]
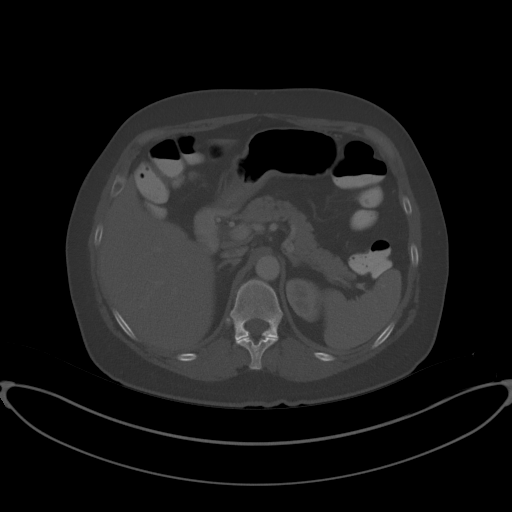
[im 84/108  soft-tissue]
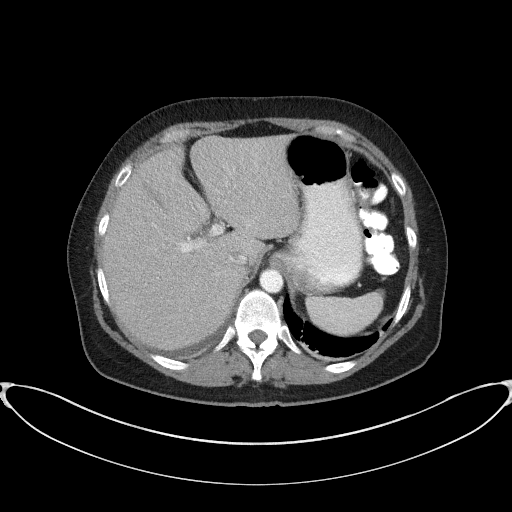
[im 90/108  soft-tissue]
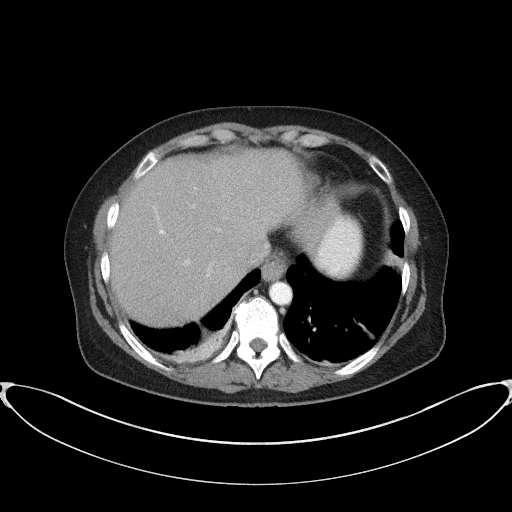
[im 102/108  soft-tissue]
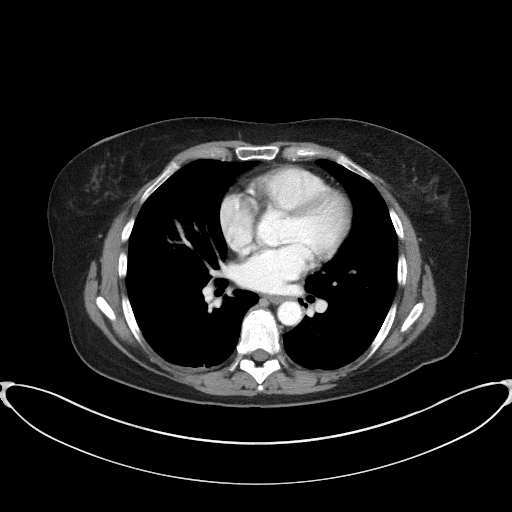

[Series 5: coronal st · coronal · 0.73mm/px · 3 of 101 slices shown]
[im 34/101  soft-tissue]
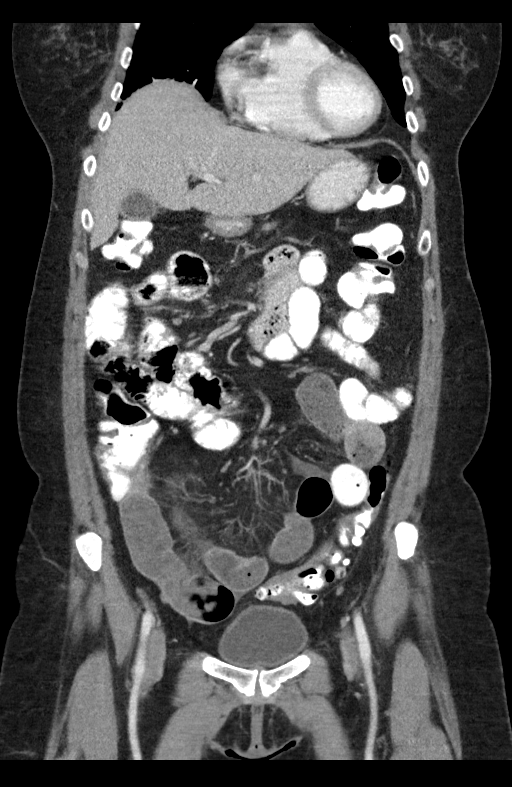
[im 45/101  soft-tissue]
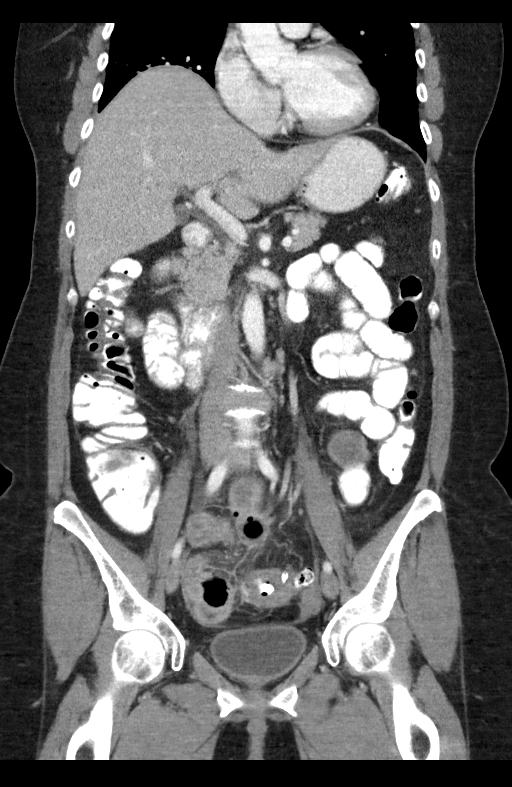
[im 56/101  soft-tissue]
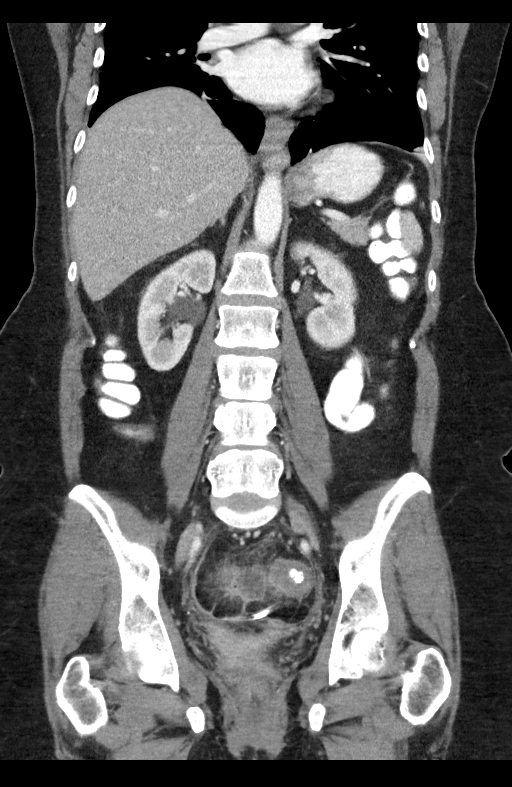

[15 of 46 positions shown; findings below may reference images not displayed]

FINDINGS: Lower chest: Increased linear atelectasis at both lung bases, right
greater than left. Tiny new right pleural effusion.

Hepatobiliary: No focal liver abnormality is seen. No gallstones,
gallbladder wall thickening, or biliary dilatation.

Pancreas: Unremarkable. No pancreatic ductal dilatation or
surrounding inflammatory changes.

Spleen: Normal in size without focal abnormality.

Adrenals/Urinary Tract: Normal adrenal glands. Slight prominence of
the renal collecting systems bilaterally, right more than left,
unchanged, and probably due to slight compression of the right
ureter in the pelvis by inflammation.

Stomach/Bowel: The patient has undergone insertion of an abscess
drain in the pelvis since the prior CT scan. The main abscess pocket
has almost resolved. However, 3D adjacent probably connecting
abscess pockets are slightly larger, containing air and fluid.

There is persistent mucosal edema in the adjacent sigmoid colon.
Numerous diverticula are seen in the distal descending and sigmoid
portions of the colon. Slightly increased prominence of distal small
bowel loops. One loop of small bowel is compressed by the abscess in
the right side of the pelvis best seen on image 82 of series 2.

There is increased presacral edema.

Vascular/Lymphatic: No significant vascular findings are present. No
enlarged abdominal or pelvic lymph nodes.

Reproductive: Uterus has been removed.  Ovaries appear normal.

Other: No free air.

Musculoskeletal: No acute abnormalities. Severe bilateral facet
arthritis at L4-5.
IMPRESSION: 1. Almost complete drainage of the tip dominant abscess in the
pelvis since insertion of the abscess drainage catheter.
2. Slight increased size of 3 adjacent abscesses in the pelvis.
3. Persistent mucosal edema in the distal sigmoid.

## 2017-05-03 MED ORDER — SODIUM CHLORIDE 0.9 % IV SOLN
INTRAVENOUS | Status: DC
  Administered 2017-05-04: 08:00:00 via INTRAVENOUS

## 2017-05-03 MED ORDER — IOPAMIDOL (ISOVUE-300) INJECTION 61%
100.0000 mL | Freq: Once | INTRAVENOUS | Status: AC | PRN
  Administered 2017-05-03: 09:00:00 100 mL via INTRAVENOUS

## 2017-05-03 MED ORDER — IOPAMIDOL (ISOVUE-300) INJECTION 61%: 15.0000 mL | INTRAVENOUS | Status: DC

## 2017-05-03 MED ORDER — AMOXICILLIN-POT CLAVULANATE 875-125 MG PO TABS: 1.0000 | ORAL_TABLET | Freq: Two times a day (BID) | ORAL | 0 refills | Status: AC

## 2017-05-03 MED ORDER — IOPAMIDOL (ISOVUE-300) INJECTION 61%
15.0000 mL | INTRAVENOUS | Status: AC
  Administered 2017-05-03 (×2): 15 mL via ORAL

## 2017-05-03 NOTE — Progress Notes (Addendum)
SURGICAL PROGRESS NOTE (cpt [phone])  Hospital Day(s): 5.   Post op day(s):  Marland Kitchen   Interval History: Patient seen and examined, no acute events or new complaints overnight. Patient continues to deny further abdominal pain and continues to tolerate advancement of her diet and ambulation with routine +flatus and +BM. She also denies N/V, fever/chills, CP, or SOB.  Review of Systems:  Constitutional: denies fever, chills  HEENT: denies cough or congestion  Respiratory: denies any shortness of breath  Cardiovascular: denies chest pain or palpitations  Gastrointestinal: abdominal pain, N/V, and bowel function as per interval history Genitourinary: denies burning with urination or urinary frequency Musculoskeletal: denies pain, decreased motor or sensation Integumentary: denies any other rashes or skin discolorations except pelvic drain Neurological: denies HA or vision/hearing changes   Vital signs in last 24 hours: [min-max] current  Temp:  [98.2 F (36.8 C)-98.7 F (37.1 C)] 98.7 F (37.1 C) (01/02 1315) Pulse Rate:  [62-74] 74 (01/02 1315) Resp:  [14-24] 24 (01/02 1315) BP: (105-118)/(39-65) 118/57 (01/02 1315) SpO2:  [93 %-97 %] 96 % (01/02 1315)     Height: 5\' 7"  (170.2 cm) Weight: 183 lb (83 kg) BMI (Calculated): 28.66   Intake/Output this shift:  No intake/output data recorded.   Intake/Output last 2 shifts:  @IOLAST2SHIFTS @   Physical Exam:  Constitutional: alert, cooperative and no distress  HENT: normocephalic without obvious abnormality  Eyes: PERRL, EOM's grossly intact and symmetric  Neuro: CN II - XII grossly intact and symmetric without deficit  Respiratory: breathing non-labored at rest  Cardiovascular: regular rate and sinus rhythm  Gastrointestinal: soft, non-tender, and non-distended with drain well-secured with only a small volume of serosanguinous fluid in drainage gravity bag and tubing Musculoskeletal: UE and LE FROM, no edema or wounds, motor and  sensation grossly intact, NT   Labs:  CBC Latest Ref Rng & Units 05/02/2017 04/30/2017 04/29/2017  WBC 3.6 - 11.0 K/uL 10.4 15.9(H) 18.8(H)  Hemoglobin 12.0 - 16.0 g/dL 12.2 12.2 11.9(L)  Hematocrit 35.0 - 47.0 % 36.8 36.9 35.8  Platelets 150 - 440 K/uL 475(H) 472(H) 512(H)   CMP Latest Ref Rng & Units 04/29/2017 04/28/2017  Glucose 65 - 99 mg/dL 173(H) 113(H)  BUN 6 - 20 mg/dL 11 21(H)  Creatinine 0.44 - 1.00 mg/dL 1.05(H) 1.14(H)  Sodium 135 - 145 mmol/L 135 137  Potassium 3.5 - 5.1 mmol/L 3.6 3.7  Chloride 101 - 111 mmol/L 101 104  CO2 22 - 32 mmol/L 25 27  Calcium 8.9 - 10.3 mg/dL 8.2(L) 8.5(L)  Total Protein 6.5 - 8.1 g/dL - 6.7  Total Bilirubin 0.3 - 1.2 mg/dL - 1.0  Alkaline Phos 38 - 126 U/L - 135(H)  AST 15 - 41 U/L - 29  ALT 14 - 54 U/L - 36   Imaging studies:  CT Abdomen and Pelvis with Contrast (05/03/2017) - personally reviewed and discussed with both IR and with patient and her husband 1. Almost complete drainage of the tip dominant abscess in the pelvis since insertion of the abscess drainage catheter. 2. Slight increased size of 3 adjacent abscesses in the pelvis. 3. Persistent mucosal edema in the distal sigmoid.  Assessment/Plan:(ICD-10's: K56.20) 62 y.o.femalewith improving acute sigmoid colonic diverticulitis withmultiplepelvic abscessess/p image-guided percutaneous drainage of the largest (one of three) abscesses and resolved leukocytosis, complicated by pertinent comorbidities includingchronic unmanaged constipation, a history of abdominal hysterectomy, and a maternal history of adenocarcinoma to lungs with uncertain primary tumor.  - continue IV antibiotics             -  soft diet with PO nutritional supplements - results of CT abdomen and pelvis were discussed with IR and with patient and her husband             - rather than discharge home today, NPO after midnight for image-guided drainage of 2nd enlarged pelvic  abscess - discharge planning, tentatively following percutaneous abscess drainage tomorrow - medical management of comorbidities (home medications) - DVT prophylaxis, ambulation encouraged  All of the above findings and recommendations were discussed with the patient, and all of patient's questions were answered to her expressed satisfaction.    -- Marilynne Drivers Rosana Hoes, MD, [place]: [facility] - Partnering for exceptional care. Office: [phone]

## 2017-05-03 NOTE — Plan of Care (Signed)
Pt.has minimal pain.  Independent in care.  Tolerating diet.

## 2017-05-03 NOTE — Discharge Instructions (Addendum)
In addition to included general instructions for Diverticulitis and Abscess Drain,  Diet: Resume home heart healthy LOW-FIBER (easy to digest foods) diet x 6 weeks, followed by HIGH-FIBER diet.   Activity: Light activity and walking are encouraged. Do not drive or drink alcohol if taking narcotic pain medications.  Wound care: Between removing and replacing any gauze dressing(s), you may shower/get incision wet with soapy water and pat dry (do not rub incisions).   Medications: Resume all home medications AND complete course of prescribed antibiotics even if feeling better/well. For mild to moderate pain: acetaminophen (Tylenol) or ibuprofen/naproxen (if no kidney disease). Combining Tylenol with alcohol can substantially increase your risk of causing liver disease. Narcotic pain medications, if prescribed, can be used for severe pain, though may cause nausea, constipation, and drowsiness. Do not combine Tylenol and Percocet (or similar) within a 6 hour period as Percocet (and similar) contain(s) Tylenol. If you do not need the narcotic pain medication, you do not need to fill the prescription.  Call office [phone]) at any time if any questions, worsening pain, fevers/chills, bleeding, drainage from incision site, or other concerns.

## 2017-05-04 ENCOUNTER — Inpatient Hospital Stay: Payer: Managed Care, Other (non HMO)

## 2017-05-04 LAB — PROTIME-INR
INR: 1.1
PROTHROMBIN TIME: 14.1 s (ref 11.4–15.2)

## 2017-05-04 IMAGING — CT CT IMAGE GUIDED DRAINAGE BY PERCUTANEOUS CATHETER
1 of 2 series · 14 of 32 positions shown, 19 images · non-contrast
Comparison: none

INDICATION: Pelvic abscess
TECHNIQUE: Informed written consent was obtained from the patient after a
thorough discussion of the procedural risks, benefits and
alternatives. All questions were addressed. Maximal Sterile Barrier
Technique was utilized including caps, mask, sterile gowns, sterile
gloves, sterile drape, hand hygiene and skin antiseptic. A timeout
was performed prior to the initiation of the procedure.

[Series 2: i-spiral 5.0 b30f · axial · 0.88mm/px · z∈[-324,-220]mm · 14 of 36 slices shown, 19 images]
[im 3/36  soft-tissue]
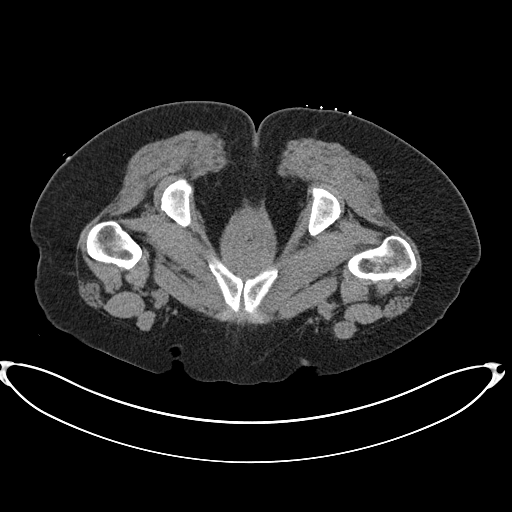
[im 3/36  bone]
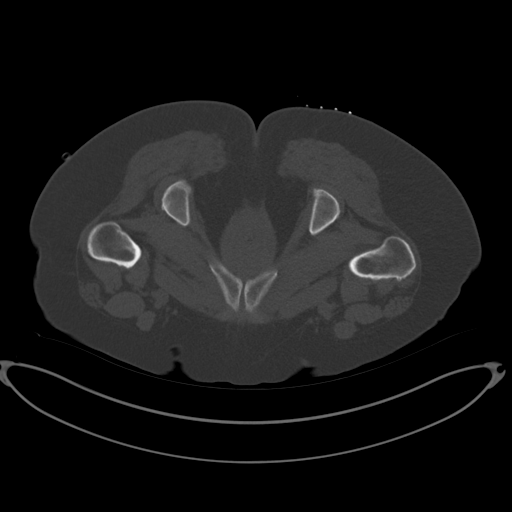
[im 5/36  soft-tissue]
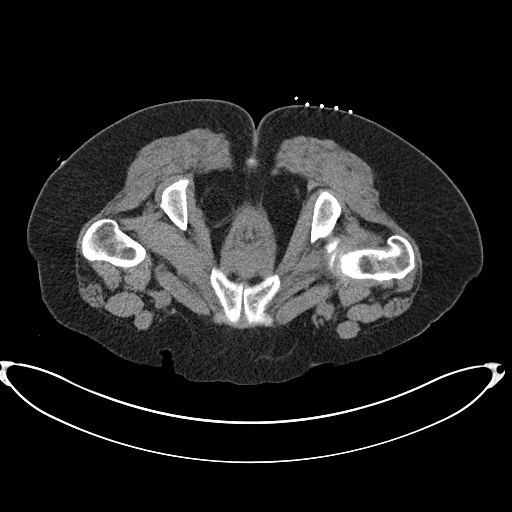
[im 7/36  soft-tissue]
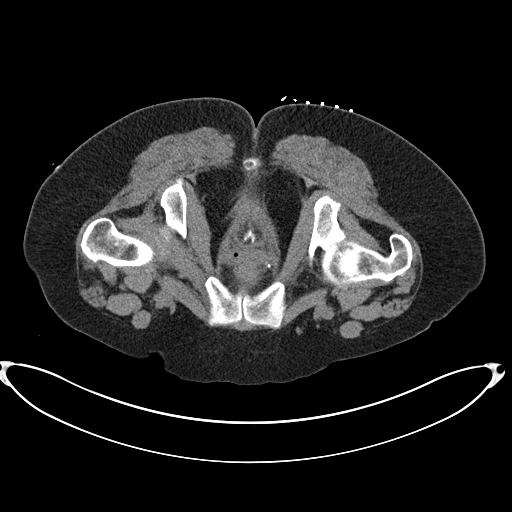
[im 11/36  soft-tissue]
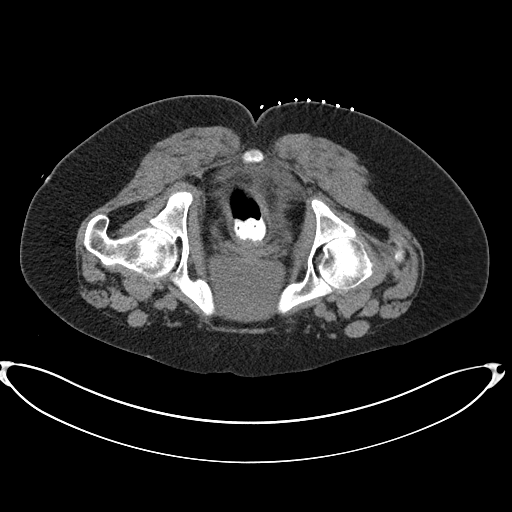
[im 14/36  soft-tissue]
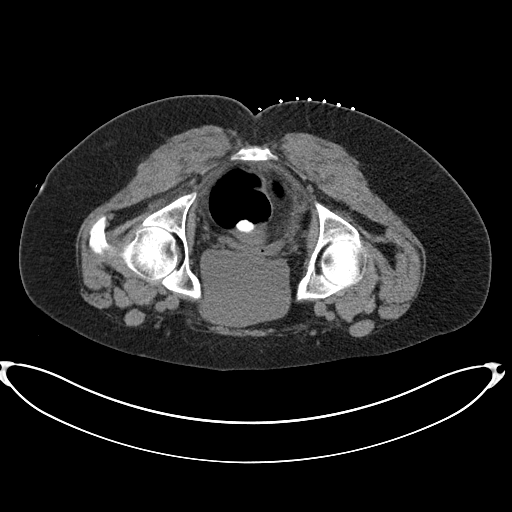
[im 16/36  soft-tissue]
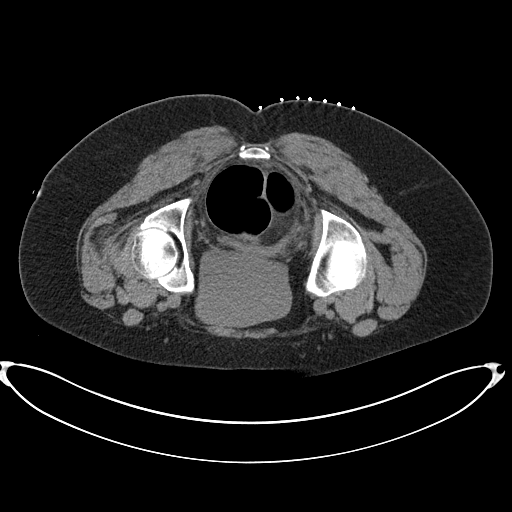
[im 18/36  soft-tissue]
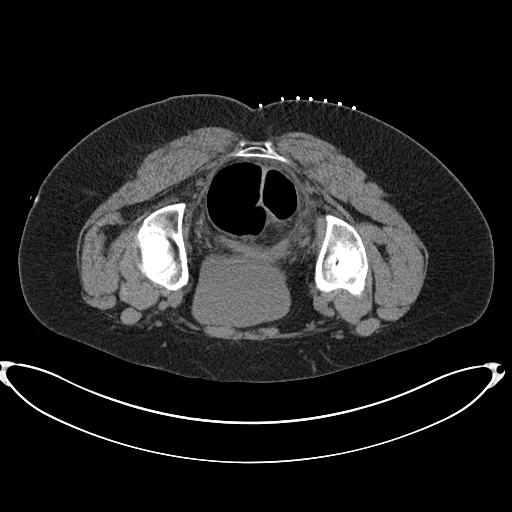
[im 20/36  soft-tissue]
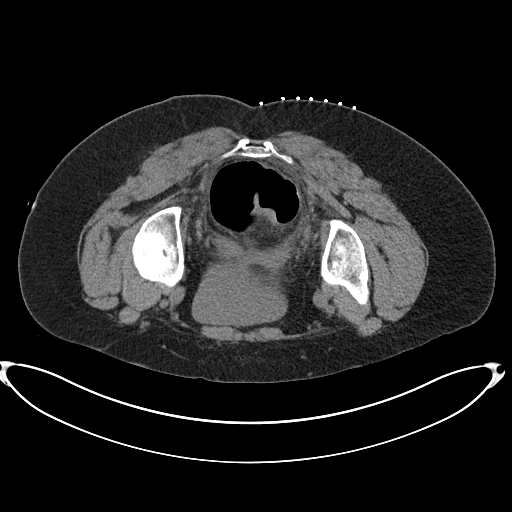
[im 22/36  soft-tissue]
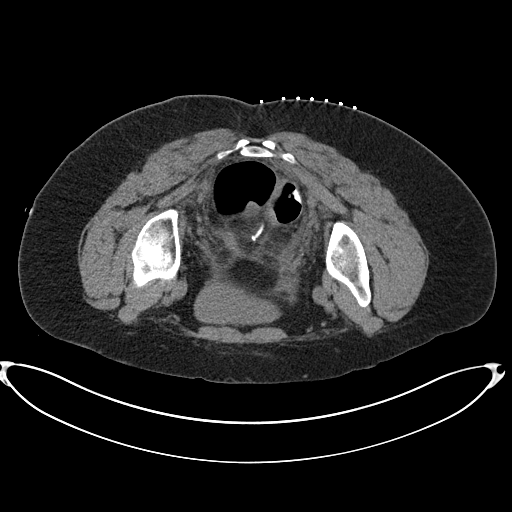
[im 22/36  bone]
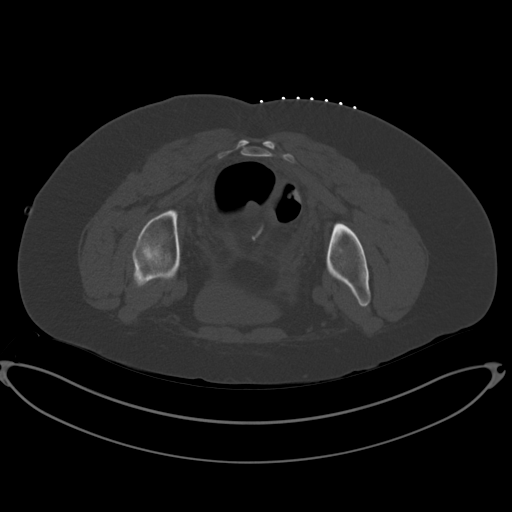
[im 25/36  soft-tissue]
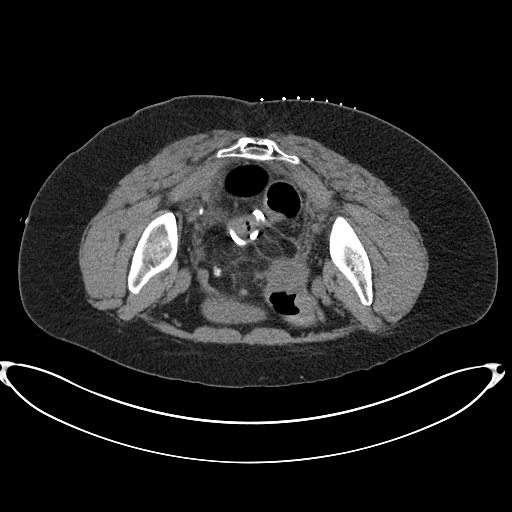
[im 27/36  lung]
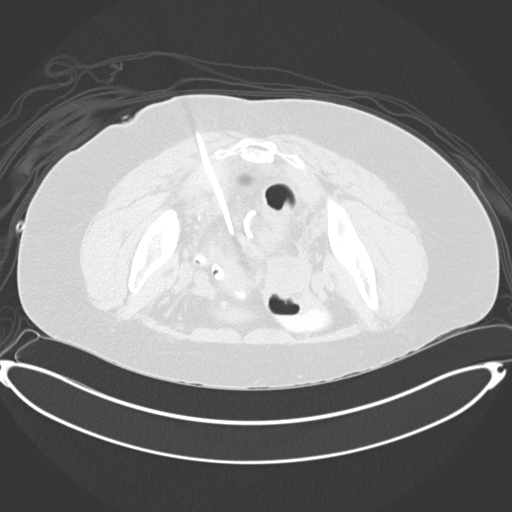
[im 29/36  soft-tissue]
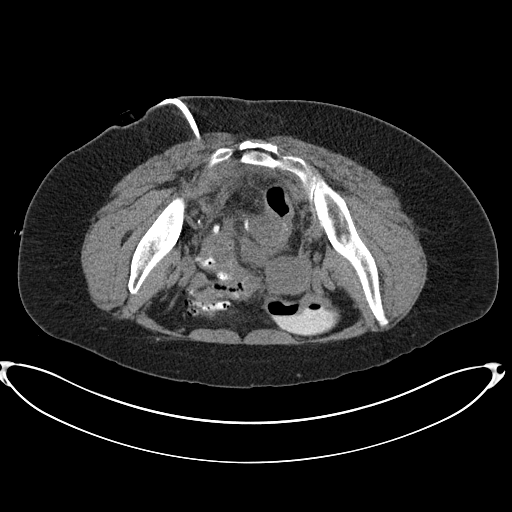
[im 29/36  lung]
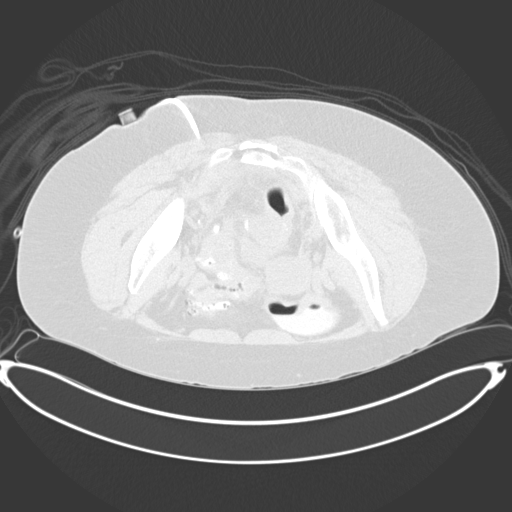
[im 31/36  soft-tissue]
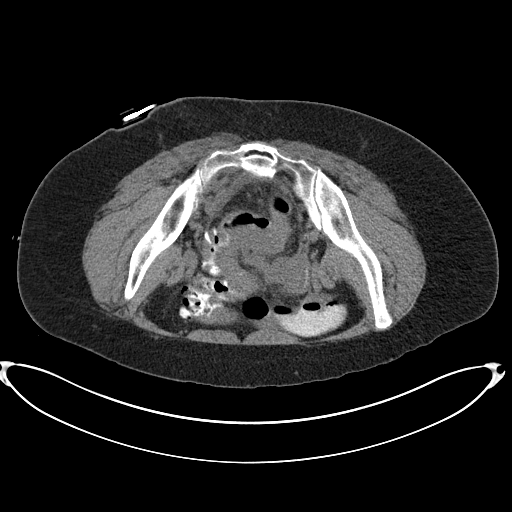
[im 31/36  lung]
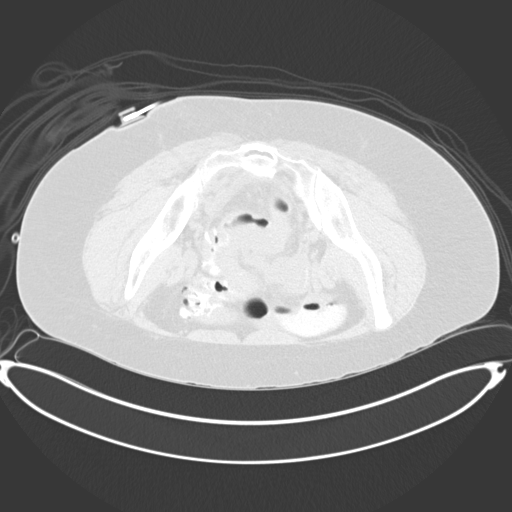
[im 33/36  soft-tissue]
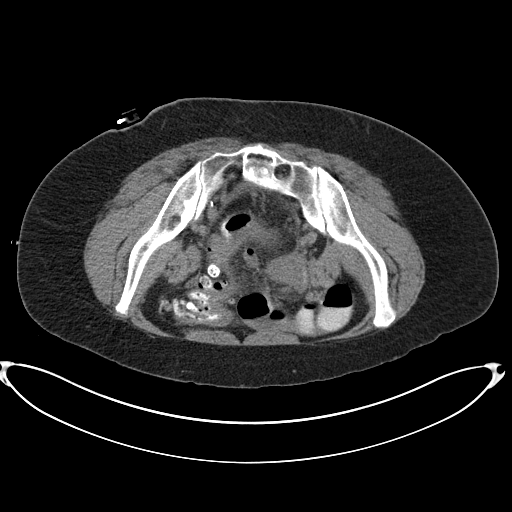
[im 33/36  lung]
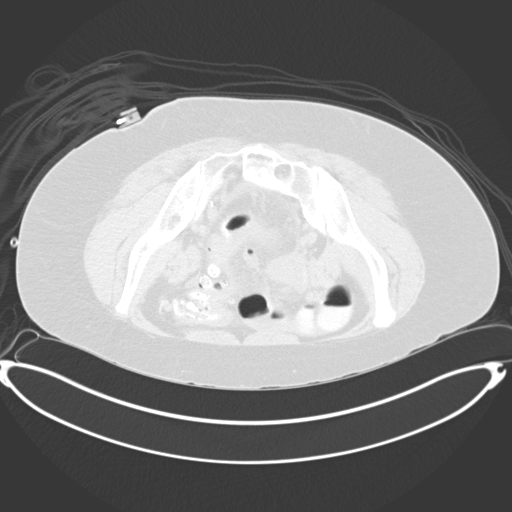

[14 of 32 positions shown; findings below may reference images not displayed]

EXAM:
CT GUIDED DRAINAGE OF PELVIC ABSCESS

MEDICATIONS:
The patient is currently admitted to the hospital and receiving
intravenous antibiotics. The antibiotics were administered within an
appropriate time frame prior to the initiation of the procedure.

ANESTHESIA/SEDATION:
3.5 mg IV Versed 100 mcg IV Fentanyl

Moderate Sedation Time:  17 minutes

The patient was continuously monitored during the procedure by the
interventional radiology nurse under my direct supervision.

COMPLICATIONS:
None immediate.
PROCEDURE:
The right gluteal region in the prone position was prepped with
ChloraPrep in a sterile fashion, and a sterile drape was applied
covering the operative field. A sterile gown and sterile gloves were
used for the procedure. Local anesthesia was provided with 1%
Lidocaine. Under CT guidance, an 18 gauge needle was inserted into
the pelvic abscess and removed over an Amplatz wire. Ten French
dilator followed by a 10 French drain were inserted. It was looped
and string fixed in the fluid collection. Frank pus was aspirated.
FINDINGS: Imaging documents placement of a 10 French right trans gluteal
pelvic abscess drain.
IMPRESSION: Successful 10 French trans gluteal right pelvic abscess drain
yielding pus.

## 2017-05-04 MED ORDER — MIDAZOLAM HCL 5 MG/5ML IJ SOLN
INTRAMUSCULAR | Status: AC | PRN
  Administered 2017-05-04 (×2): 1 mg via INTRAVENOUS
  Administered 2017-05-04: 0.5 mg via INTRAVENOUS
  Administered 2017-05-04: 1 mg via INTRAVENOUS

## 2017-05-04 MED ORDER — LIDOCAINE HCL (PF) 1 % IJ SOLN
INTRAMUSCULAR | Status: AC | PRN
  Administered 2017-05-04: 6 mL

## 2017-05-04 MED ORDER — FENTANYL CITRATE (PF) 100 MCG/2ML IJ SOLN
INTRAMUSCULAR | Status: AC
  Filled 2017-05-04: qty 4

## 2017-05-04 MED ORDER — FENTANYL CITRATE (PF) 100 MCG/2ML IJ SOLN
INTRAMUSCULAR | Status: AC | PRN
  Administered 2017-05-04: 50 ug via INTRAVENOUS
  Administered 2017-05-04 (×2): 25 ug via INTRAVENOUS

## 2017-05-04 MED ORDER — SODIUM CHLORIDE 0.9% FLUSH: 5.0000 mL | Freq: Three times a day (TID) | INTRAVENOUS | Status: DC

## 2017-05-04 MED ORDER — DOCUSATE SODIUM 100 MG PO CAPS: 100.0000 mg | ORAL_CAPSULE | Freq: Two times a day (BID) | ORAL | 2 refills | Status: AC | PRN

## 2017-05-04 MED ORDER — MIDAZOLAM HCL 5 MG/5ML IJ SOLN
INTRAMUSCULAR | Status: AC
  Filled 2017-05-04: qty 5

## 2017-05-04 MED ORDER — OXYCODONE HCL 5 MG PO TABS: 5.0000 mg | ORAL_TABLET | ORAL | 0 refills | Status: DC | PRN

## 2017-05-04 NOTE — Procedures (Signed)
Abscess Frances Gallagher R transgluteal 10 Fr abscess drain Pus EBL 0 Comp 0

## 2017-05-04 NOTE — Progress Notes (Signed)
Chief Complaint: Patient was seen today for  Chief Complaint  Patient presents with  . Abdominal Pain    Supervising Physician: Marybelle Killings  Patient Status: [place] - In-pt  History of Present Illness: Frances Gallagher is a 62 y.o. female who presented with acute diverticulitis and had one left transgluteal abscess drain placed by IR. A repeat scan shows a persistent loculated pelvic abscess. She is referred for additional drainage vs. Aspiration.  History reviewed. No pertinent past medical history.  Past Surgical History:  Procedure Laterality Date  . ABDOMINAL HYSTERECTOMY    . KNEE SURGERY      Allergies: Patient has no known allergies.  Medications: Prior to Admission medications   Medication Sig Start Date End Date Taking? Authorizing Provider  cyclobenzaprine (FLEXERIL) 10 MG tablet Take 1 tablet by mouth 3 (three) times daily. 11/08/16  Yes [provider]  etodolac (LODINE) 500 MG tablet Take 1 tablet by mouth daily. 04/11/17  Yes [provider]  zolpidem (AMBIEN) 10 MG tablet Take 5 mg by mouth at bedtime. 03/08/17  Yes [provider]  amoxicillin-clavulanate (AUGMENTIN) 875-125 MG tablet Take 1 tablet by mouth 2 (two) times daily for 14 days. 05/03/17 05/17/17  Vickie Epley, MD     Family History  Problem Relation Age of Onset  . Lung cancer Mother   . Breast cancer Maternal Aunt     Social History   Socioeconomic History  . Marital status: Married    Spouse name: None  . Number of children: None  . Years of education: None  . Highest education level: None  Social Needs  . Financial resource strain: None  . Food insecurity - worry: None  . Food insecurity - inability: None  . Transportation needs - medical: None  . Transportation needs - non-medical: None  Occupational History  . None  Tobacco Use  . Smoking status: Never Smoker  . Smokeless tobacco: Never Used  Substance and Sexual Activity  . Alcohol use: No     Frequency: Never  . Drug use: No  . Sexual activity: None  Other Topics Concern  . None  Social History Narrative  . None     Review of Systems: A 12 point ROS discussed and pertinent positives are indicated in the HPI above.  All other systems are negative.  Review of Systems  Vital Signs: BP (!) 104/56 (BP Location: Left Arm)   Pulse 63   Temp 97.7 F (36.5 C) (Oral)   Resp 13   Ht 5\' 7"  (1.702 m)   Wt 183 lb (83 kg)   SpO2 95%   BMI 28.66 kg/m   Physical Exam  Constitutional: She is oriented to person, place, and time. She appears well-developed and well-nourished.  HENT:  Head: Normocephalic and atraumatic.  Cardiovascular: Normal rate and regular rhythm.  Pulmonary/Chest: Effort normal and breath sounds normal.  Neurological: She is alert and oriented to person, place, and time.    Imaging: Ct Abdomen Pelvis W Contrast  Result Date: 05/03/2017 CLINICAL DATA:  Diverticular abscess.  Abdominal pain and fever. EXAM: CT ABDOMEN AND PELVIS WITH CONTRAST TECHNIQUE: Multidetector CT imaging of the abdomen and pelvis was performed using the standard protocol following bolus administration of intravenous contrast. CONTRAST:  167mL ISOVUE-300 IOPAMIDOL (ISOVUE-300) INJECTION 61% COMPARISON:  CT scan dated 04/28/2017 FINDINGS: Lower chest: Increased linear atelectasis at both lung bases, right greater than left. Tiny new right pleural effusion. Hepatobiliary: No focal liver abnormality is seen.  No gallstones, gallbladder wall thickening, or biliary dilatation. Pancreas: Unremarkable. No pancreatic ductal dilatation or surrounding inflammatory changes. Spleen: Normal in size without focal abnormality. Adrenals/Urinary Tract: Normal adrenal glands. Slight prominence of the renal collecting systems bilaterally, right more than left, unchanged, and probably due to slight compression of the right ureter in the pelvis by inflammation. Stomach/Bowel: The patient has undergone insertion of  an abscess drain in the pelvis since the prior CT scan. The main abscess pocket has almost resolved. However, 3D adjacent probably connecting abscess pockets are slightly larger, containing air and fluid. There is persistent mucosal edema in the adjacent sigmoid colon. Numerous diverticula are seen in the distal descending and sigmoid portions of the colon. Slightly increased prominence of distal small bowel loops. One loop of small bowel is compressed by the abscess in the right side of the pelvis best seen on image 82 of series 2. There is increased presacral edema. Vascular/Lymphatic: No significant vascular findings are present. No enlarged abdominal or pelvic lymph nodes. Reproductive: Uterus has been removed.  Ovaries appear normal. Other: No free air. Musculoskeletal: No acute abnormalities. 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 IMPRESSION: 1. Almost complete drainage of the tip dominant abscess in the pelvis since insertion of the abscess drainage catheter. 2. Slight increased size of 3 adjacent abscesses in the pelvis. 3. Persistent mucosal edema in the distal sigmoid. Electronically Signed   By: Lorriane Shire M.D.   On: 05/03/2017 09:43   Ct Abdomen Pelvis W Contrast  Result Date: 04/28/2017 CLINICAL DATA:  Persistent UTIs with abdominal pain, initial encounter EXAM: CT ABDOMEN AND PELVIS WITH CONTRAST TECHNIQUE: Multidetector CT imaging of the abdomen and pelvis was performed using the standard protocol following bolus administration of intravenous contrast. CONTRAST:  138mL ISOVUE-300 IOPAMIDOL (ISOVUE-300) INJECTION 61% COMPARISON:  None. FINDINGS: Lower chest: Minimal bibasilar atelectatic changes are noted. Hepatobiliary: No focal liver abnormality is seen. No gallstones, gallbladder wall thickening, or biliary dilatation. Pancreas: Unremarkable. No pancreatic ductal dilatation or surrounding inflammatory changes. Spleen: Normal in size without focal abnormality. Adrenals/Urinary Tract: The  adrenal glands are within normal limits bilaterally. The bladder is well distended. The collecting systems are mildly prominent bilaterally likely related to distal ureteral compression. No ureteral calculi are seen. No renal calculi are noted. Small parapelvic cysts are noted as well. Stomach/Bowel: The appendix is well visualized and within normal limits. Extensive diverticular change is noted within the colon particularly in the sigmoid colon where extensive diverticulitis is noted. Pericolonic inflammatory changes noted in there are at least 2 extraluminal air fluid collections identified. One of these lies just superior to the sigmoid colon and measures approximately 6.1 by 4.0 cm. The second lies somewhat more superiorly adjacent to the terminal ileum and measures approximately 4.2 cm in greatest dimension. A smaller 15 mm air-fluid collection is noted interposed between the two larger collections and may represent an area of communication between the collections. Considerable thickening of the distal small bowel wall is noted with edema. This is likely reactive in nature although the possibility of an intrinsic small-bowel abnormality could not be totally excluded on this exam. The appendix is within normal limits. Some free fluid is noted within the abdomen and pelvis in addition to the previously described air-fluid collections. Vascular/Lymphatic: No significant vascular findings are present. No enlarged abdominal or pelvic lymph nodes. Reproductive: Status post hysterectomy. No adnexal masses. Other: Free fluid is noted as described above. No hernia is identified. Musculoskeletal: Mild degenerative changes of the lumbar spine are  noted. IMPRESSION: Changes most consistent with diverticulitis with multiple air-fluid collections surrounding the sigmoid colon as described. There is a smaller interposing air-fluid collection between the two larger collections likely representing some intercommunication  between the two. Small bowel inflammatory changes noted involving the distal ileum. This is likely reactive in nature although the possibility of intrinsic small bowel disease could not be totally excluded on the basis of this exam. No evidence of fistulization to the urinary bladder is noted. Some mild fullness of the collecting systems is seen related to distal ureteral compression. Electronically Signed   By: Inez Catalina M.D.   On: 04/28/2017 11:44   Ct Image Guided Drainage Percut Cath  Peritoneal Retroperit  Result Date: 04/28/2017 INDICATION: Diverticular abscess. Please perform CT-guided percutaneous drainage catheter placement for infection source control purposes. EXAM: CT-GUIDED LEFT TRANS GLUTEAL APPROACH PERCUTANEOUS DRAINAGE CATHETER PLACEMENT COMPARISON:  CT of the abdomen and pelvis - earlier same day MEDICATIONS: The patient is currently admitted to the hospital and receiving intravenous antibiotics. The antibiotics were administered within an appropriate time frame prior to the initiation of the procedure. ANESTHESIA/SEDATION: Moderate (conscious) sedation was employed during this procedure. A total of Versed 4 mg and Fentanyl 100 mcg was administered intravenously. Moderate Sedation Time: 24 minutes. The patient's level of consciousness and vital signs were monitored continuously by radiology nursing throughout the procedure under my direct supervision. CONTRAST:  None COMPLICATIONS: None immediate. PROCEDURE: Informed written consent was obtained from the patient after a discussion of the risks, benefits and alternatives to treatment. The patient was placed prone on the CT gantry and a pre procedural CT was performed re-demonstrating the known abscess/fluid collection within the lower pelvis with dominant component measuring approximately 6.5 x 4.5 cm (image 30, series 2). The procedure was planned. A timeout was performed prior to the initiation of the procedure. The skin overlying the left  buttocks was prepped and draped in the usual sterile fashion. The overlying soft tissues were anesthetized with 1% lidocaine with epinephrine. Appropriate trajectory was planned with the use of a 22 gauge spinal needle. An 18 gauge trocar needle was advanced into the abscess/fluid collection and a short Amplatz super stiff wire was coiled within the collection. Appropriate positioning was confirmed with a limited CT scan. The tract was serially dilated allowing placement of a 10 [place] all-purpose drainage catheter. Appropriate positioning was confirmed with a limited postprocedural CT scan. Approximately 25 Ml of purulent fluid was aspirated. The tube was connected to a drainage bag and sutured in place. A dressing was placed. The patient tolerated the procedure well without immediate post procedural complication. IMPRESSION: Successful CT guided placement of a 10 French all purpose drain catheter into the lower pelvis via left trans gluteal approach with aspiration of 25 mL of purulent fluid. Samples were sent to the laboratory as requested by the ordering clinical team. Electronically Signed   By: Sandi Mariscal M.D.   On: 04/28/2017 16:58    Labs:  CBC: Recent Labs    04/28/17 1230 04/29/17 0405 04/30/17 0521 05/02/17 0602  WBC 19.5* 18.8* 15.9* 10.4  HGB 14.1 11.9* 12.2 12.2  HCT 43.9 35.8 36.9 36.8  PLT 515* 512* 472* 475*    COAGS: Recent Labs    04/28/17 1644 05/04/17 0501  INR 1.06 1.10    BMP: Recent Labs    04/28/17 0923 04/29/17 0405  NA 137 135  K 3.7 3.6  CL 104 101  CO2 27 25  GLUCOSE 113* 173*  BUN  21* 11  CALCIUM 8.5* 8.2*  CREATININE 1.14* 1.05*  GFRNONAA 50* 55*  GFRAA 58* >60    LIVER FUNCTION TESTS: Recent Labs    04/28/17 0923  BILITOT 1.0  AST 29  ALT 36  ALKPHOS 135*  PROT 6.7  ALBUMIN 2.7*    TUMOR MARKERS: No results for input(s): AFPTM, CEA, CA199, CHROMGRNA in the last 8760 hours.  Assessment and Plan:  Pelvic abscess. Drain to  follow.  Electronically Signed: Zayaan Kozak, ART A, MD 05/04/2017, 9:54 AM   I spent a total of   15 Minutes in face to face in clinical consultation, greater than 50% of which was counseling/coordinating care for abscess drainage.

## 2017-05-04 NOTE — Progress Notes (Signed)
Discharge instructions along with home medications, dressing changes and flushes and follow up gone over with patient and husband. Both verbalize that they understood instructions. 3 prescriptions given to patient. IV's removed. Pt being discharged home on room air, no distress noted. Ammie Dalton, RN

## 2017-05-05 NOTE — Discharge Summary (Signed)
Physician Discharge Summary  Patient ID: Frances Gallagher MRN: [id number] DOB/AGE: 05/25/1955 62 y.o.  Admit date: 04/28/2017 Discharge date: 05/05/2017  Admission Diagnoses:  Discharge Diagnoses:  Active Problems:   Acute diverticulitis   Discharged Condition: good  Hospital Course: 62 y.o. female presented to [facility] ED for lower abdominal pain and fevers x 8 - 10 days. Workup was found to be significant for CT imaging demonstrating sigmoid colonic diverticulitis with pelvic abscesses x 4 (~6 cm, ~4 cm, ~15 mm, and another tiny abscess). Due to uncertainty whether these abscesses were connected with each other and a less than optimal window by which to approach the 4 cm abscess, the patient underwent image-guided IR drainage of her ~6 cm pelvic abscess and was treated otherwise with IV antibiotics. Patient's pain and leukocytosis both continued to improve/resolve, and advancement of patient's diet and ambulation were well-tolerated with progressively serous and decreasing drainage from her abscess drain.   On her day of anticipated discharge, a CT of her abdomen was repeated to reassess her abscesses, which found her 4 cm pelvic abscess had increased in size and now appeared to have a window via which to drain her abscess. Post-procedure, patient was afebrile with her pain well-controlled, and she expressed her wishes to return home. The remainder of patient's hospital course was essentially unremarkable, and discharge planning was initiated accordingly with patient safely able to be discharged home with appropriate discharge instructions, antibiotics, pain control, and outpatient follow-up with anticipated future colonoscopy 6 weeks following resolution of her inflammation after all of her and family's questions were answered to their expressed satisfaction.  Consults: None  Significant Diagnostic Studies: radiology: CT scan: Sigmoid colonic diverticulitis with pelvic abscesses x4 (~6 cm, ~4  cm, ~15 mm, and tiny)  Treatments: IV hydration, antibiotics: Zosyn, analgesia, and procedures: percutaneous drainage catheter placement x2  Discharge Exam: Blood pressure 111/73, pulse 71, temperature 97.7 F (36.5 C), temperature source Oral, resp. rate 15, height 5\' 7"  (1.702 m), weight 183 lb (83 kg), SpO2 98 %. General appearance: alert, cooperative and no distress GI: soft and non-distended with only Right buttock peri-drain tenderness to palpation, otherwise non-tender to palpation with purulent fluid in Right buttock drain and serous fluid in Left buttock gravity drain  Disposition: 01-Home or Self Care   Allergies as of 05/04/2017   No Known Allergies     Medication List    TAKE these medications   amoxicillin-clavulanate 875-125 MG tablet Commonly known as:  AUGMENTIN Take 1 tablet by mouth 2 (two) times daily for 14 days.   cyclobenzaprine 10 MG tablet Commonly known as:  FLEXERIL Take 1 tablet by mouth 3 (three) times daily.   docusate sodium 100 MG capsule Commonly known as:  COLACE Take 1 capsule (100 mg total) by mouth 2 (two) times daily as needed (1 to 2 times Daily as needed for mild to moderate constipation).   etodolac 500 MG tablet Commonly known as:  LODINE Take 1 tablet by mouth daily.   oxyCODONE 5 MG immediate release tablet Commonly known as:  Oxy IR/ROXICODONE Take 1-2 tablets (5-10 mg total) by mouth every 4 (four) hours as needed for severe pain.   zolpidem 10 MG tablet Commonly known as:  AMBIEN Take 5 mg by mouth at bedtime.      Follow-up Information    Frances Epley, MD. Go on 05/10/2017.   Specialty:  General Surgery Why:  @9 :30 AM Contact information: [place]  [address] [phone]           Signed: Vickie Gallagher 05/05/2017, 2:30 PM

## 2017-05-08 ENCOUNTER — Other Ambulatory Visit: Payer: Self-pay

## 2017-05-09 LAB — AEROBIC/ANAEROBIC CULTURE W GRAM STAIN (SURGICAL/DEEP WOUND)

## 2017-05-09 LAB — AEROBIC/ANAEROBIC CULTURE (SURGICAL/DEEP WOUND)

## 2017-05-10 ENCOUNTER — Ambulatory Visit (INDEPENDENT_AMBULATORY_CARE_PROVIDER_SITE_OTHER): Payer: Managed Care, Other (non HMO) | Admitting: Surgery

## 2017-05-10 ENCOUNTER — Encounter: Payer: Self-pay | Admitting: Surgery

## 2017-05-10 ENCOUNTER — Other Ambulatory Visit: Payer: Self-pay

## 2017-05-10 VITALS — BP 113/76 | HR 83 | Temp 98.2°F | Ht 67.0 in | Wt 180.2 lb

## 2017-05-10 DIAGNOSIS — K5792 Diverticulitis of intestine, part unspecified, without perforation or abscess without bleeding: Secondary | ICD-10-CM

## 2017-05-10 DIAGNOSIS — K5780 Diverticulitis of intestine, part unspecified, with perforation and abscess without bleeding: Secondary | ICD-10-CM

## 2017-05-10 NOTE — Progress Notes (Signed)
Surgical Clinic Progress/Follow-up Note   HPI:  62 y.o. Female presents to clinic for follow-up evaluation of her sigmoid colonic diverticulitis with multiple pelvic abscesses s/p image-guided drainage of the 2 largest abscesses. Patient reports her Right trans-gluteal drain has continued to consistently drain purulent fluid, while her Left gluteal drain has intermittently been draining a smaller amount of alternating thin purulent and clear fluid. She denies any abdominal pain and says her appetite has improved somewhat, but remains limited. She describes "some abdominal bloating", but says she is passing normal amounts of flatus and daily soft (but specifies not loose) BM's. She also reports a fever to 101 yesterday and once or twice elevated temperatures <101 since she was discharged home from the hospital 1 week ago. She otherwise denies N/V, CP, palpitations, or SOB.  Review of Systems:  Constitutional: fever/chills as per HPI, denies any other weight loss or sweats  Eyes: denies any other vision changes, history of eye injury  ENT: denies sore throat, hearing problems  Respiratory: denies shortness of breath, wheezing  Cardiovascular: denies chest pain, palpitations  Gastrointestinal: abdominal pain, N/V, and bowel function as per HPI Musculoskeletal: denies any other joint pains or cramps  Skin: Denies any other rashes or skin discolorations  Neurological: denies any other headache, dizziness, weakness  Psychiatric: denies any other depression, anxiety  All other review of systems: otherwise negative   Vital Signs:  BP 113/76   Pulse 83   Temp 98.2 F (36.8 C) (Oral)   Ht 5\' 7"  (1.702 m)   Wt 180 lb 3.2 oz (81.7 kg)   BMI 28.22 kg/m    Physical Exam:  Constitutional:  -- Normal body habitus  -- Awake, alert, and oriented x3  Eyes:  -- Pupils equally round and reactive to light  -- No scleral icterus  Ear, nose, throat:  -- No jugular venous distension  -- No nasal  drainage, bleeding Pulmonary:  -- No crackles -- Equal breath sounds bilaterally -- Breathing non-labored at rest Cardiovascular:  -- S1, S2 present  -- No pericardial rubs  Gastrointestinal:  -- Soft, completely nontender, non-distended, no guarding/rebound -- B/L trans-gluteal drains well-secured without surrounding inflammation, Right drain with small amount of blood on dressing, R > L purulent fluid in both Right bulb suction and Left to gravity drain (thin in Left) -- No abdominal masses appreciated, pulsatile or otherwise  Musculoskeletal / Integumentary:  -- Wounds or skin discoloration: None appreciated except as described above (GI)  -- Extremities: B/L UE and LE FROM, hands and feet warm, no edema  Neurologic:  -- Motor function: intact and symmetric  -- Sensation: intact and symmetric   Assessment:  62 y.o. yo Female with a problem list including...  Patient Active Problem List   Diagnosis Date Noted  . Acute diverticulitis 04/28/2017  . 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 Anxiety, generalized 04/04/2016  . Bilateral hip pain 04/04/2016  . 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  Plan:   - continue to improve nutrition with nutritional supplements prn  - repeat CT and CBC prior to completion of prescribed antibiotics next Thursday  - return to clinic next weeks, may need to extend antibiotics if still drainage or abscess  - also discussed with patient possibility of surgery, elective vs semi-elective if not improving  - overdue for colonoscopy (12  years), will need 6 weeks after resolution of inflammation  - instructed to call office if any questions or concerns  All of the above recommendations were discussed with the patient and patient's family, and all of patient's and  family's questions were answered to their expressed satisfaction.  -- Marilynne Drivers Rosana Hoes, MD, [place]: [facility] - Partnering for exceptional care. Office: [phone]

## 2017-05-10 NOTE — Patient Instructions (Addendum)
We have ordered some labs to be drawn today. Please proceed to the [facility] to have these tests completed prior to leaving today. You will check in at the registration desk in the medical mall. Please see walking directions below if needed.  We will call you with the results and next step in plan of care as soon as results are received.   Directions to Medical Mall: When leaving our office, go right. Go all of the way down to the very end of the hallway. You will have a purple wall in front of you. You will now have a tunnel to the hospital on your left hand side. Go through this tunnel and the elevators will be on your left. Go down to the 1st floor and take a slight left. The very first desk on the right hand side is the registration desk. We have scheduled you for a CT Scan of your Abdomen and Pelvis. This has been scheduled at 8:30 at our [employer]. Please Check-in at 8:15 , 15 minutes prior to your scheduled appointment. If you need to reschedule your Scan, you may do so by calling [phone].  You will need to pick up a prep kit at least 24 hours in advance of your Scan: You may pick this up at the [facility] department at [place] [facility], or [employer].  Bring a list of medications with you to your appointment and you may have nothing to eat or drink 4 hours prior to your CT Scan.    We will follow up with you as listed below:  If you have any questions or concerns please give our office a call.

## 2017-05-11 ENCOUNTER — Telehealth: Payer: Self-pay | Admitting: Surgery

## 2017-05-11 NOTE — Telephone Encounter (Signed)
Patient has some concerns about fluid around incision.  Please advise.

## 2017-05-12 ENCOUNTER — Telehealth: Payer: Self-pay

## 2017-05-12 NOTE — Telephone Encounter (Signed)
Patient stated that when she flushes her drain small amount  of the fluid is seeping around the base of the drain  We discussed that sometimes this will happen. She is taking her antibiotic, and is having low grade fevers that were discussed while in the office . Patient instructed to keep follow up and if she worsens and has a cream of mushroom soup drainage she is to call doctor on call or go to emergency room.

## 2017-05-17 ENCOUNTER — Other Ambulatory Visit
Admission: RE | Admit: 2017-05-17 | Discharge: 2017-05-17 | Disposition: A | Discharge status: Home / Self Care | Payer: Managed Care, Other (non HMO) | Source: Ambulatory Visit | Attending: Surgery | Admitting: Surgery

## 2017-05-17 ENCOUNTER — Inpatient Hospital Stay: Payer: Self-pay | Admitting: Surgery

## 2017-05-17 DIAGNOSIS — K5792 Diverticulitis of intestine, part unspecified, without perforation or abscess without bleeding: Secondary | ICD-10-CM | POA: Diagnosis present

## 2017-05-17 LAB — CBC WITH DIFFERENTIAL/PLATELET
Basophils Absolute: 0.1 10*3/uL (ref 0–0.1)
Basophils Relative: 2 %
EOS ABS: 0.1 10*3/uL (ref 0–0.7)
Eosinophils Relative: 2 %
HEMATOCRIT: 37.3 % (ref 35.0–47.0)
HEMOGLOBIN: 12.4 g/dL (ref 12.0–16.0)
LYMPHS ABS: 1.2 10*3/uL (ref 1.0–3.6)
Lymphocytes Relative: 21 %
MCH: 27.9 pg (ref 26.0–34.0)
MCHC: 33.2 g/dL (ref 32.0–36.0)
MCV: 84 fL (ref 80.0–100.0)
Monocytes Absolute: 0.3 10*3/uL (ref 0.2–0.9)
Monocytes Relative: 5 %
NEUTROS PCT: 70 %
Neutro Abs: 4.1 10*3/uL (ref 1.4–6.5)
Platelets: 404 10*3/uL (ref 150–440)
RBC: 4.45 MIL/uL (ref 3.80–5.20)
RDW: 13.9 % (ref 11.5–14.5)
WBC: 5.8 10*3/uL (ref 3.6–11.0)

## 2017-05-18 ENCOUNTER — Encounter: Payer: Self-pay | Admitting: Surgery

## 2017-05-18 ENCOUNTER — Ambulatory Visit
Admission: RE | Admit: 2017-05-18 | Discharge: 2017-05-18 | Disposition: A | Discharge status: Home / Self Care | Payer: Managed Care, Other (non HMO) | Source: Ambulatory Visit | Attending: General Surgery | Admitting: General Surgery

## 2017-05-18 ENCOUNTER — Ambulatory Visit (INDEPENDENT_AMBULATORY_CARE_PROVIDER_SITE_OTHER): Payer: Managed Care, Other (non HMO) | Admitting: Surgery

## 2017-05-18 VITALS — BP 112/70 | HR 87 | Temp 97.5°F | Ht 67.0 in | Wt 176.2 lb

## 2017-05-18 DIAGNOSIS — K5792 Diverticulitis of intestine, part unspecified, without perforation or abscess without bleeding: Secondary | ICD-10-CM | POA: Diagnosis not present

## 2017-05-18 DIAGNOSIS — K5720 Diverticulitis of large intestine with perforation and abscess without bleeding: Secondary | ICD-10-CM

## 2017-05-18 DIAGNOSIS — K5732 Diverticulitis of large intestine without perforation or abscess without bleeding: Secondary | ICD-10-CM

## 2017-05-18 IMAGING — CT CT ABD-PELV W/ CM
2 of 6 series · 11 of 46 positions shown, 12 images · IV contrast (iopamidol)
Comparison: CT scan of [DATE].

CLINICAL DATA: Status post pelvic abscess drainage.

EXAM:
CT ABDOMEN AND PELVIS WITH CONTRAST
TECHNIQUE: Multidetector CT imaging of the abdomen and pelvis was performed
using the standard protocol following bolus administration of
intravenous contrast.
CONTRAST:  100mL [F3] IOPAMIDOL ([F3]) INJECTION 61%

[Series 2: abd pelvis 5.00 br40 s3 ax · axial · 0.56mm/px · z∈[-1731,-1291]mm · 8 of 104 slices shown, 9 images]
[im 8/104  soft-tissue]
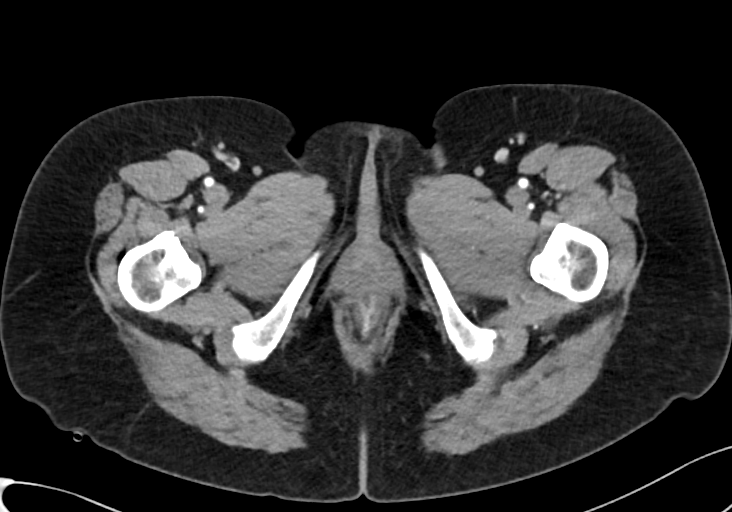
[im 8/104  bone]
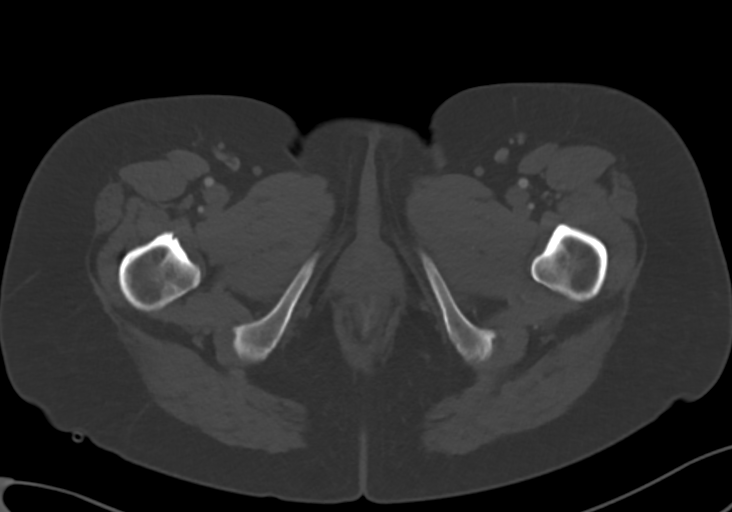
[im 23/104  soft-tissue]
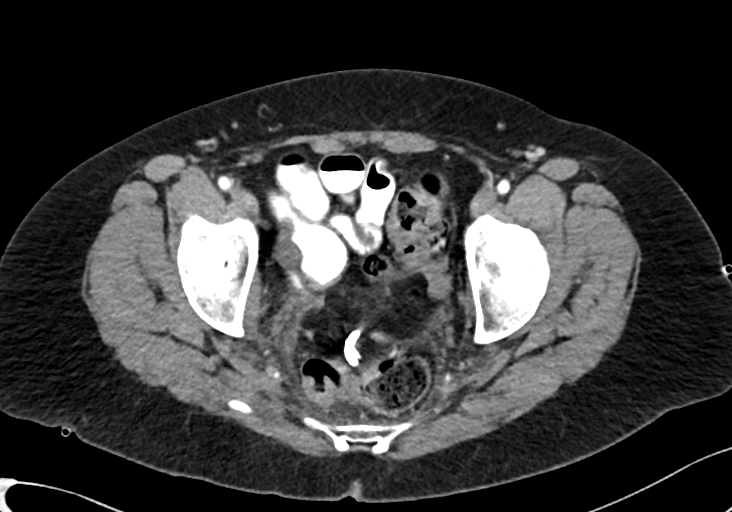
[im 37/104  soft-tissue]
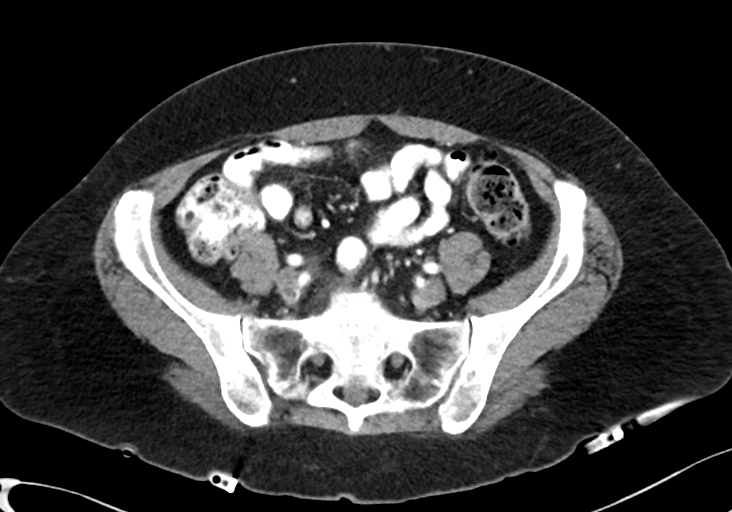
[im 45/104  soft-tissue]
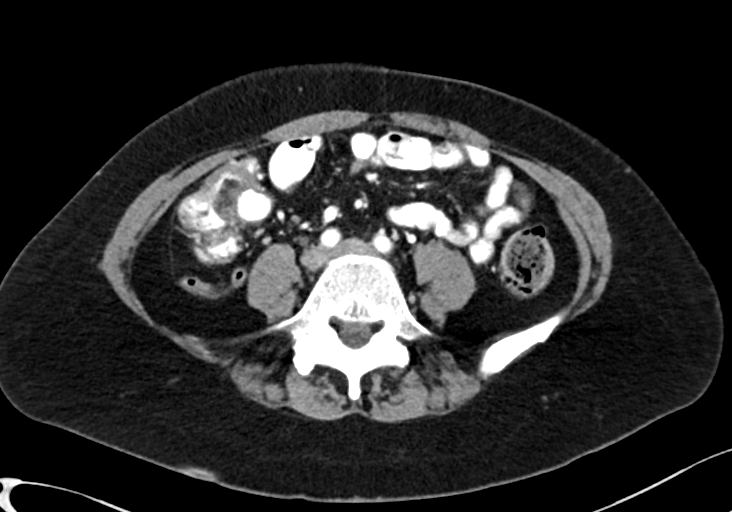
[im 59/104  soft-tissue]
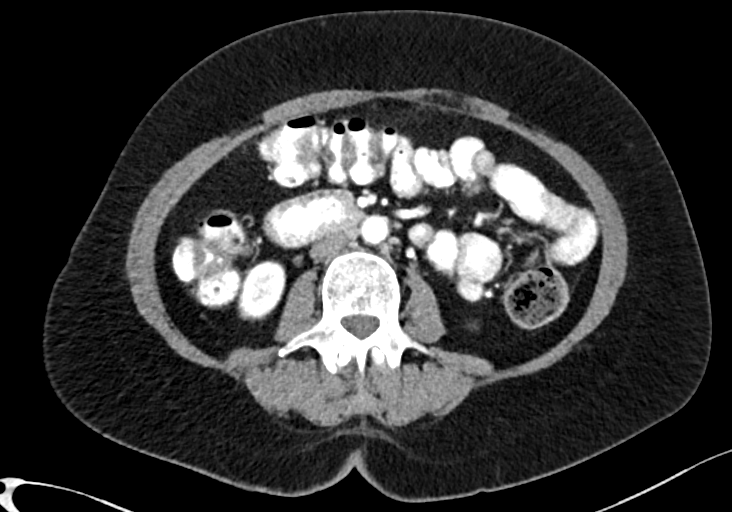
[im 67/104  soft-tissue]
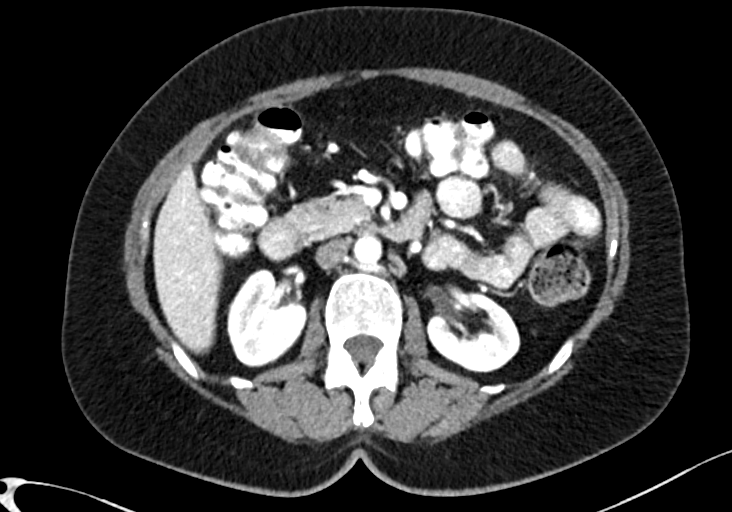
[im 81/104  soft-tissue]
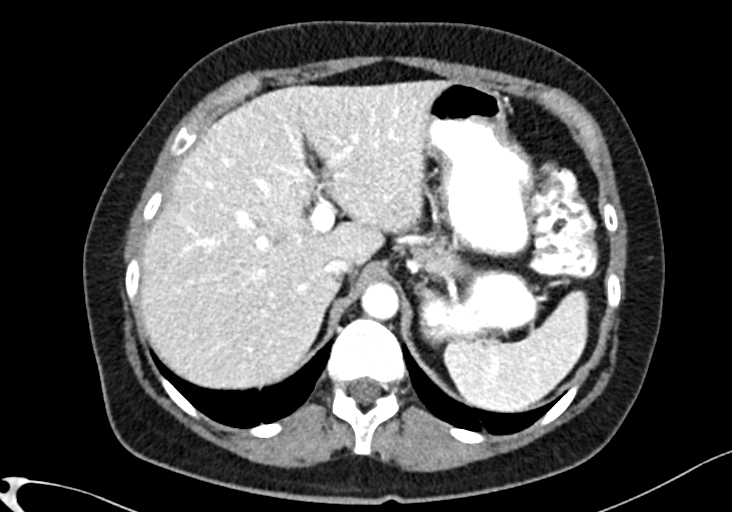
[im 96/104  soft-tissue]
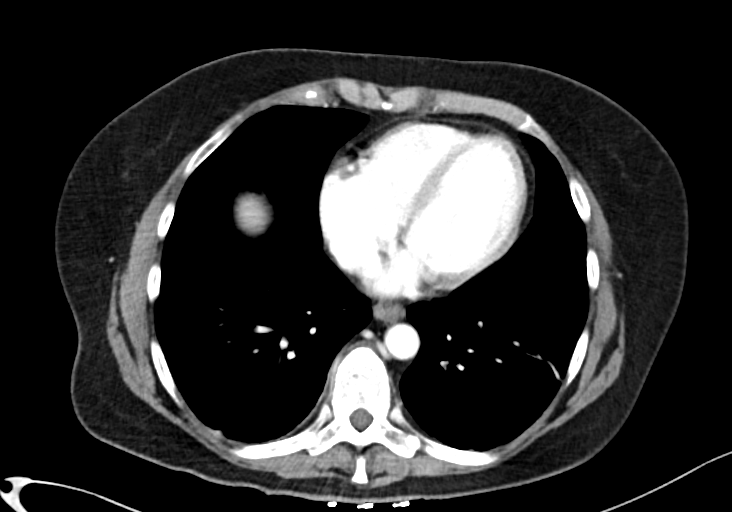

[Series 6: abd pelvis 2.00 br40 s3 cor · coronal · 0.80mm/px · 3 of 140 slices shown]
[im 47/140  soft-tissue]
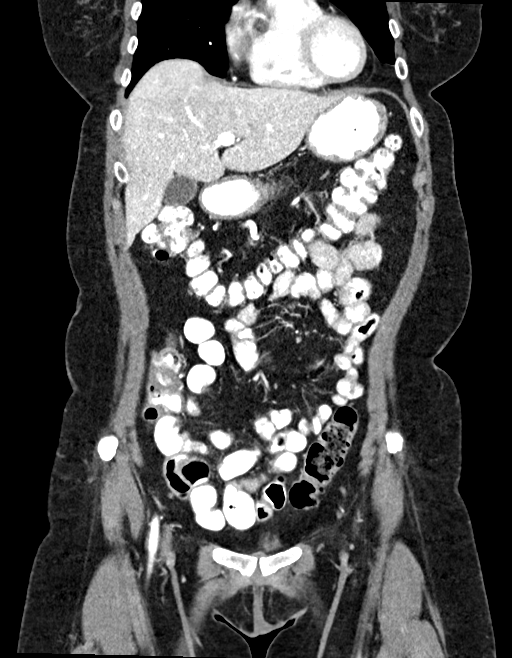
[im 62/140  soft-tissue]
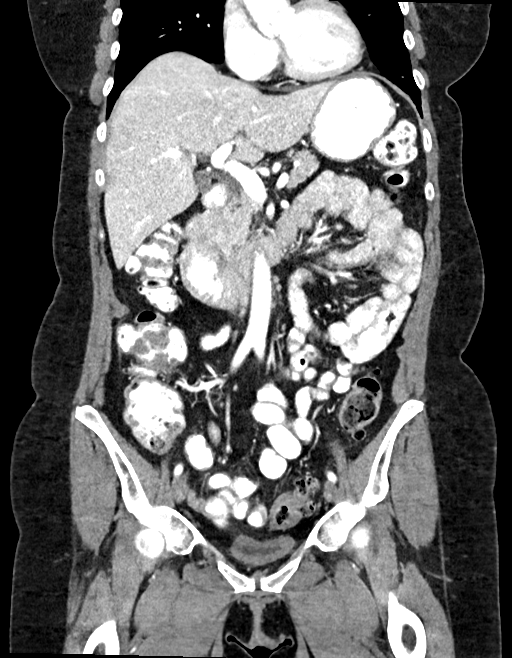
[im 78/140  soft-tissue]
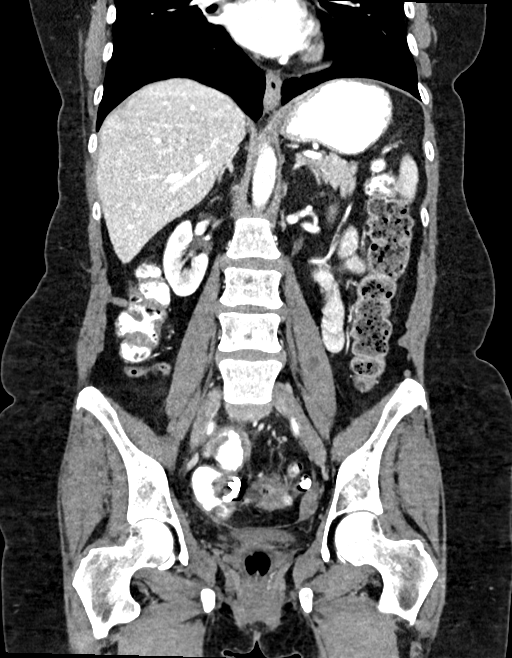

[11 of 46 positions shown; findings below may reference images not displayed]

FINDINGS: Lower chest: No acute abnormality.

Hepatobiliary: No focal liver abnormality is seen. No gallstones,
gallbladder wall thickening, or biliary dilatation.

Pancreas: Unremarkable. No pancreatic ductal dilatation or
surrounding inflammatory changes.

Spleen: Normal in size without focal abnormality.

Adrenals/Urinary Tract: Adrenal glands are unremarkable. Kidneys are
normal, without renal calculi, focal lesion, or hydronephrosis.
Bladder is unremarkable.

Stomach/Bowel: The appendix and stomach are unremarkable. Sigmoid
diverticulosis is noted with persistent wall thickening which may
represent acute diverticulitis or colitis, or sequela from previous
inflammation. There is no evidence of bowel obstruction.

Vascular/Lymphatic: No significant vascular findings are present. No
enlarged abdominal or pelvic lymph nodes.

Reproductive: Status post hysterectomy. No adnexal masses.

Other: 2 drainage catheters are seen in the pelvis, from bilateral
trans gluteal approach. No significant residual fluid collection is
noted. No hernia is noted.

Musculoskeletal: No acute or significant osseous findings.
IMPRESSION: Two transgluteal drainage catheters are noted in the pelvis, with no
significant residual fluid collection around either catheter tip.

Residual wall thickening of distal sigmoid colon is noted which may
represent acute diverticulitis or colitis, or sequela from previous
inflammation.

## 2017-05-18 MED ORDER — CIPROFLOXACIN HCL 500 MG PO TABS: 500.0000 mg | ORAL_TABLET | Freq: Two times a day (BID) | ORAL | 0 refills | Status: AC

## 2017-05-18 MED ORDER — METRONIDAZOLE 500 MG PO TABS: 500.0000 mg | ORAL_TABLET | Freq: Three times a day (TID) | ORAL | 0 refills | Status: AC

## 2017-05-18 MED ORDER — IOPAMIDOL (ISOVUE-300) INJECTION 61%
100.0000 mL | Freq: Once | INTRAVENOUS | Status: AC | PRN
  Administered 2017-05-18: 100 mL via INTRAVENOUS

## 2017-05-18 NOTE — Patient Instructions (Signed)
We have sent in two new antibiotics please start these today and complete until finished.  We have also pulled one of your drains today. This area will drain some please keep covered with dressing until you no longer see drainage. Once you no longer see any drain you may keep this area uncovered. You may get this area wet while showering however be sure to pat area dry.  We will see you back as listed below:

## 2017-05-19 ENCOUNTER — Encounter: Payer: Self-pay | Admitting: Surgery

## 2017-05-19 NOTE — Progress Notes (Signed)
Outpatient Surgical Follow Up  05/19/2017  Frances Gallagher is an 62 y.o. female.   Chief Complaint  Patient presents with  . Follow-up    Acute Diverticilitis Review CT Scan and Labs Possibly pull drain    HPI: 62 year old female with a recent episode of complicated diverticulitis with abscess requiring 2 percutaneous drains by interventional radiology. She is doing well and had a low-grade temperature yesterday. She did finish the antibiotics yesterday as well.She does have a CT scan that I have personally reviewed showing significant improvement in the collections and drains within the pelvis. No evidence of free perforation. There is some mild inflammatory response in the sigmoid colon. I also reviewed her CBC that is normal. She is taking by mouth.  No past medical history on file.  Past Surgical History:  Procedure Laterality Date  . ABDOMINAL HYSTERECTOMY    . KNEE SURGERY      Family History  Problem Relation Age of Onset  . Lung cancer Mother   . Breast cancer Maternal Aunt     Social History:  reports that  has never smoked. she has never used smokeless tobacco. She reports that she does not drink alcohol or use drugs.  Allergies: No Known Allergies  Medications reviewed.    ROS Full ROS performed and is otherwise negative other than what is stated in HPI   BP 112/70   Pulse 87   Temp (!) 97.5 F (36.4 C) (Oral)   Ht 5\' 7"  (1.702 m)   Wt 79.9 kg (176 lb 3.2 oz)   BMI 27.60 kg/m   Physical Exam  Constitutional: She is oriented to person, place, and time and well-developed, well-nourished, and in no distress.  Eyes: Right eye exhibits no discharge. Left eye exhibits no discharge. No scleral icterus.  Neck: Normal range of motion. No JVD present. No tracheal deviation present. No thyromegaly present.  Cardiovascular: Normal rate and regular rhythm.  Pulmonary/Chest: Effort normal. No respiratory distress. She has no wheezes. She has no rales.   Abdominal: Soft. She exhibits no distension. There is no tenderness. There is no rebound and no guarding.  Right gluteal drain with purulent material. Left drain removed with scant amount of serous fluid.  Musculoskeletal: Normal range of motion. She exhibits no edema.  Neurological: She is alert and oriented to person, place, and time. Gait normal. GCS score is 15.  Skin: Skin is warm and dry. She is not diaphoretic.  Psychiatric: Mood, memory, affect and judgment normal.  Nursing note and vitals reviewed.      No results found for this or any previous visit (from the past 48 hour(s)). Ct Abdomen Pelvis W Contrast  Result Date: 05/18/2017 CLINICAL DATA:  Status post pelvic abscess drainage. EXAM: CT ABDOMEN AND PELVIS WITH CONTRAST TECHNIQUE: Multidetector CT imaging of the abdomen and pelvis was performed using the standard protocol following bolus administration of intravenous contrast. CONTRAST:  198mL ISOVUE-300 IOPAMIDOL (ISOVUE-300) INJECTION 61% COMPARISON:  CT scan of May 03, 2017. FINDINGS: Lower chest: No acute abnormality. Hepatobiliary: No focal liver abnormality is seen. No gallstones, gallbladder wall thickening, or biliary dilatation. Pancreas: Unremarkable. No pancreatic ductal dilatation or surrounding inflammatory changes. Spleen: Normal in size without focal abnormality. Adrenals/Urinary Tract: Adrenal glands are unremarkable. Kidneys are normal, without renal calculi, focal lesion, or hydronephrosis. Bladder is unremarkable. Stomach/Bowel: The appendix and stomach are unremarkable. Sigmoid diverticulosis is noted with persistent wall thickening which may represent acute diverticulitis or colitis, or sequela from previous inflammation. There  is no evidence of bowel obstruction. Vascular/Lymphatic: No significant vascular findings are present. No enlarged abdominal or pelvic lymph nodes. Reproductive: Status post hysterectomy. No adnexal masses. Other: 2 drainage catheters are  seen in the pelvis, from bilateral trans gluteal approach. No significant residual fluid collection is noted. No hernia is noted. Musculoskeletal: No acute or significant osseous findings. IMPRESSION: Two transgluteal drainage catheters are noted in the pelvis, with no significant residual fluid collection around either catheter tip. Residual wall thickening of distal sigmoid colon is noted which may represent acute diverticulitis or colitis, or sequela from previous inflammation. Electronically Signed   By: Marijo Conception, M.D.   On: 05/18/2017 12:33    Assessment/Plan: 62 year old with complicated diverticulitis requiring 2 percutaneous drains. The left drain was removed in the office. We will keep the right drain since there is still purulent material. Given the CT findings and the purulence and the drain I will give her another course of antibiotics in the form of ciprofloxacin and Flagyl. She will have an appointment with one of my partners next week. No need for emergent surgical intervention at this time.  I had a lengthy discussion with the family and the patient about my recommendation for elective sigmoid colectomy. She will need a colonoscopy in neck 6-8 weeks and after that a sigmoid colectomy will be indicated. At rate rated to her the recommendations from our society. I also discussed with her that this was by no means an emergent procedure, I also encouraged her to find additional information and to think about my recommendations.  Greater than 50% of the 40 minutes  visit was spent in counseling/coordination of care   Caroleen Hamman, MD [facility]

## 2017-05-25 ENCOUNTER — Ambulatory Visit (INDEPENDENT_AMBULATORY_CARE_PROVIDER_SITE_OTHER): Payer: Managed Care, Other (non HMO) | Admitting: Surgery

## 2017-05-25 ENCOUNTER — Encounter: Payer: Self-pay | Admitting: Surgery

## 2017-05-25 VITALS — BP 115/70 | HR 60 | Temp 98.1°F | Ht 67.0 in | Wt 175.2 lb

## 2017-05-25 DIAGNOSIS — K5720 Diverticulitis of large intestine with perforation and abscess without bleeding: Secondary | ICD-10-CM | POA: Diagnosis not present

## 2017-05-25 NOTE — Patient Instructions (Addendum)
Today we have pulled you drain.This area may continue to drain. Please keep area covered until you no longer see drainage on the dressing.You may shower normal within 48 hours due to drain being pulled.   We will see you back in office once you have had your colonoscopy. Please give our office a call to schedule this appointment.   If you have any questions or concerns please give our office a call.

## 2017-05-25 NOTE — Progress Notes (Signed)
Surgical Clinic Progress/Follow-up Note   HPI:  62 y.o. Female presents to clinic for follow-up evaluation of sigmoid colonic diverticulitis with multiple pelvic abscesses s/p image-guided percutaneous drainage. Patient reports she's been feeling well the past few days in particular without abdominal pain, fever/chills, or N/V. She has been taking once daily colace, twice daily if constipation, and has mostly been able to minimize constipation with overall soft formed once daily BM's. ~5 mL or less per day has been draining from patient's drain and has since last week become thin and slightly pink. Patient also says she's been thinking about proceeding with surgery.  Review of Systems:  Constitutional: denies any other weight loss, fever, chills, or sweats  Eyes: denies any other vision changes, history of eye injury  ENT: denies sore throat, hearing problems  Respiratory: denies shortness of breath, wheezing  Cardiovascular: denies chest pain, palpitations  Gastrointestinal: abdominal pain, N/V, and bowel function as per HPI Musculoskeletal: denies any other joint pains or cramps  Skin: Denies any other rashes or skin discolorations  Neurological: denies any other headache, dizziness, weakness  Psychiatric: denies any other depression, anxiety  All other review of systems: otherwise negative   Vital Signs:  BP 115/70   Pulse 60   Temp 98.1 F (36.7 C) (Oral)   Ht 5\' 7"  (1.702 m)   Wt 175 lb 3.2 oz (79.5 kg)   BMI 27.44 kg/m    Physical Exam:  Constitutional:  -- Normal body habitus  -- Awake, alert, and oriented x3  Eyes:  -- Pupils equally round and reactive to light  -- No scleral icterus  Ear, nose, throat:  -- No jugular venous distension  -- No nasal drainage, bleeding Pulmonary:  -- No crackles -- Equal breath sounds bilaterally -- Breathing non-labored at rest Cardiovascular:  -- S1, S2 present  -- No pericardial rubs  Gastrointestinal:  -- Soft, nontender,  non-distended, no guarding/rebound -- Right transgluteal pelvic drain well-secured with <5 mL thin, slightly pink, slightly milky (but non-purulent) fluid and no surrounding erythema or drainage -- No abdominal masses appreciated, pulsatile or otherwise  Musculoskeletal / Integumentary:  -- Wounds or skin discoloration: None appreciated except as described above (GI)  -- Extremities: B/L UE and LE FROM, hands and feet warm, no edema  Neurologic:  -- Motor function: intact and symmetric  -- Sensation: intact and symmetric   Laboratory studies:  CBC    Component Value Date/Time   WBC 5.8 05/17/2017 1106   RBC 4.45 05/17/2017 1106   HGB 12.4 05/17/2017 1106   HCT 37.3 05/17/2017 1106   PLT 404 05/17/2017 1106   MCV 84.0 05/17/2017 1106   MCH 27.9 05/17/2017 1106   MCHC 33.2 05/17/2017 1106   RDW 13.9 05/17/2017 1106   LYMPHSABS 1.2 05/17/2017 1106   MONOABS 0.3 05/17/2017 1106   EOSABS 0.1 05/17/2017 1106   BASOSABS 0.1 05/17/2017 1106   CMP Latest Ref Rng & Units 04/29/2017 04/28/2017  Glucose 65 - 99 mg/dL 173(H) 113(H)  BUN 6 - 20 mg/dL 11 21(H)  Creatinine 0.44 - 1.00 mg/dL 1.05(H) 1.14(H)  Sodium 135 - 145 mmol/L 135 137  Potassium 3.5 - 5.1 mmol/L 3.6 3.7  Chloride 101 - 111 mmol/L 101 104  CO2 22 - 32 mmol/L 25 27  Calcium 8.9 - 10.3 mg/dL 8.2(L) 8.5(L)  Total Protein 6.5 - 8.1 g/dL - 6.7  Total Bilirubin 0.3 - 1.2 mg/dL - 1.0  Alkaline Phos 38 - 126 U/L - 135(H)  AST 15 -  41 U/L - 29  ALT 14 - 54 U/L - 36     Imaging:  CT Abdomen and Pelvis with Contrast (05/18/2017) - personally reviewed and discussed with patient Two transgluteal drainage catheters are noted in the pelvis, with no significant residual fluid collection around either catheter tip.  Residual wall thickening of distal sigmoid colon is noted which may represent acute diverticulitis or colitis, or sequela from previous inflammation.  Assessment:  62 y.o. yo Female with a problem list  including...  Patient Active Problem List   Diagnosis Date Noted  . Diverticulitis of large intestine with abscess without bleeding   . Acute diverticulitis 04/28/2017  . 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 Anxiety, generalized 04/04/2016  . Bilateral hip pain 04/04/2016  . 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  Plan:   - second/final Right trans-gluteal pelvic drain removed, dry gauze applied  - complete course of prescribed oral antibiotics ~4 more days (ciprofloxacin, metronidazole) even if feeling better/well  - maintain hydration and continue colace, colonoscopy and resume high fiber diet in 6 weeks  - return to clinic following colonoscopy to further discuss and schedule elective partial colectomy  - all of patient's and her husband's questions regarding surgery were answered  - instructed to call office if any questions or concerns  All of the above recommendations were discussed with the patient and patient's husband, and all of patient's and family's questions were answered to their expressed satisfaction.  -- Marilynne Drivers Rosana Hoes, MD, [place]: [facility] - Partnering for exceptional care. Office: [phone]

## 2018-12-18 ENCOUNTER — Other Ambulatory Visit: Payer: Self-pay | Admitting: Family Medicine

## 2018-12-18 DIAGNOSIS — Z1231 Encounter for screening mammogram for malignant neoplasm of breast: Secondary | ICD-10-CM

## 2018-12-21 ENCOUNTER — Ambulatory Visit
Admission: RE | Admit: 2018-12-21 | Discharge: 2018-12-21 | Disposition: A | Discharge status: Home / Self Care | Payer: Managed Care, Other (non HMO) | Source: Ambulatory Visit | Attending: Family Medicine | Admitting: Family Medicine

## 2018-12-21 DIAGNOSIS — Z1231 Encounter for screening mammogram for malignant neoplasm of breast: Secondary | ICD-10-CM | POA: Diagnosis present

## 2018-12-21 IMAGING — MG DIGITAL SCREENING BILATERAL MAMMOGRAM WITH TOMO AND CAD
8 series · 8 of 24 positions shown · non-contrast
Comparison: Previous exam(s).

CLINICAL DATA: Screening.

EXAM:
DIGITAL SCREENING BILATERAL MAMMOGRAM WITH TOMO AND CAD

[R CC synth-2D]
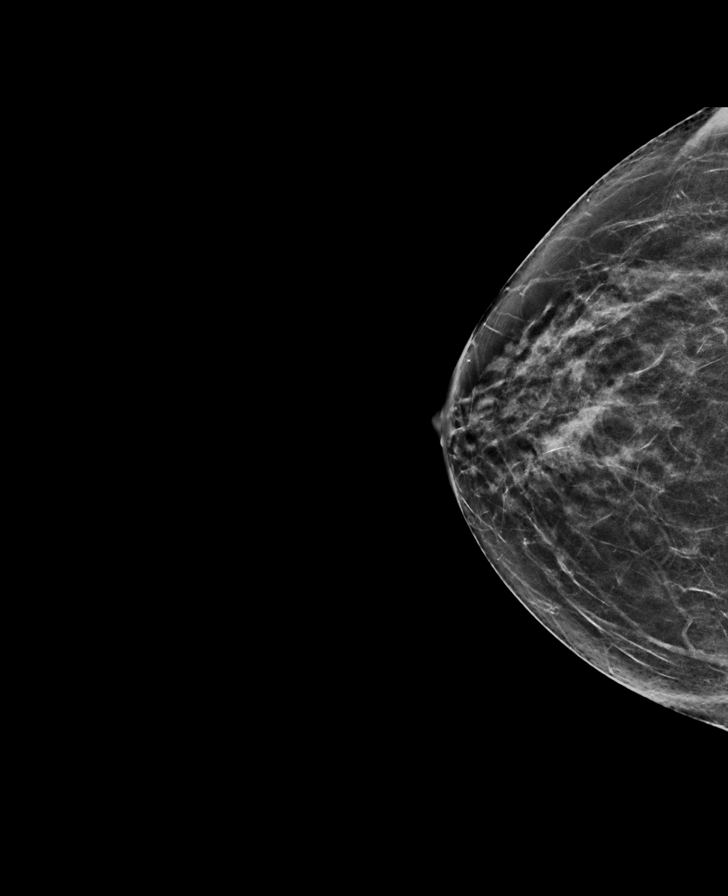

[R MLO synth-2D]
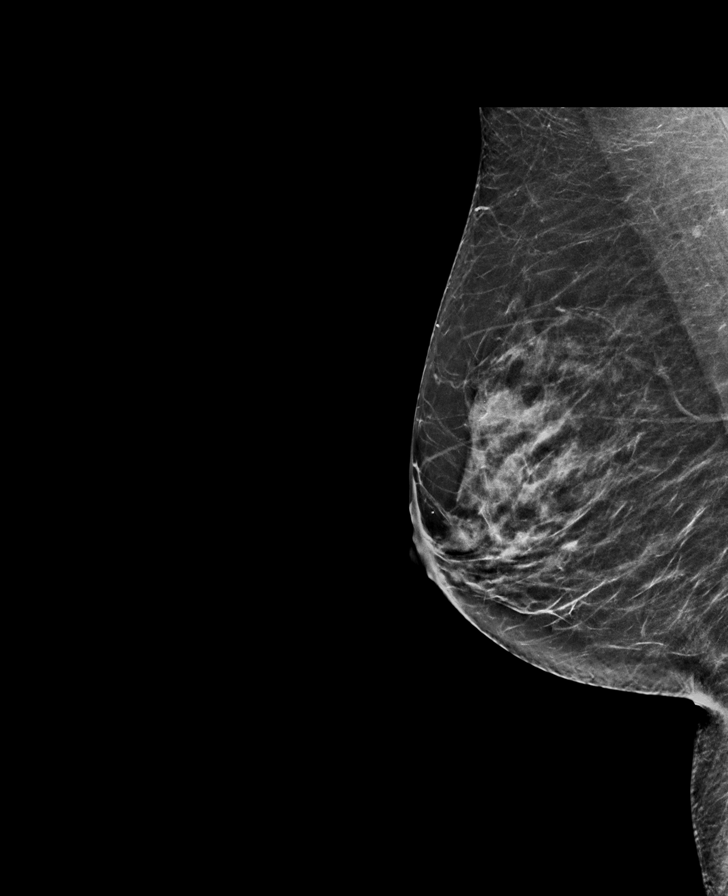

[L MLO synth-2D]
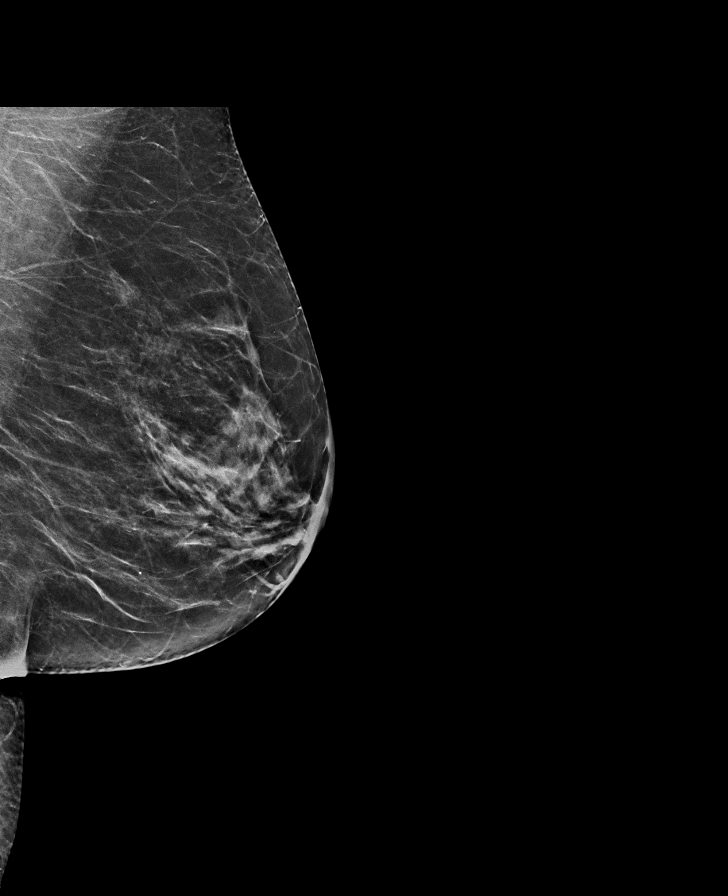

[L CC synth-2D]
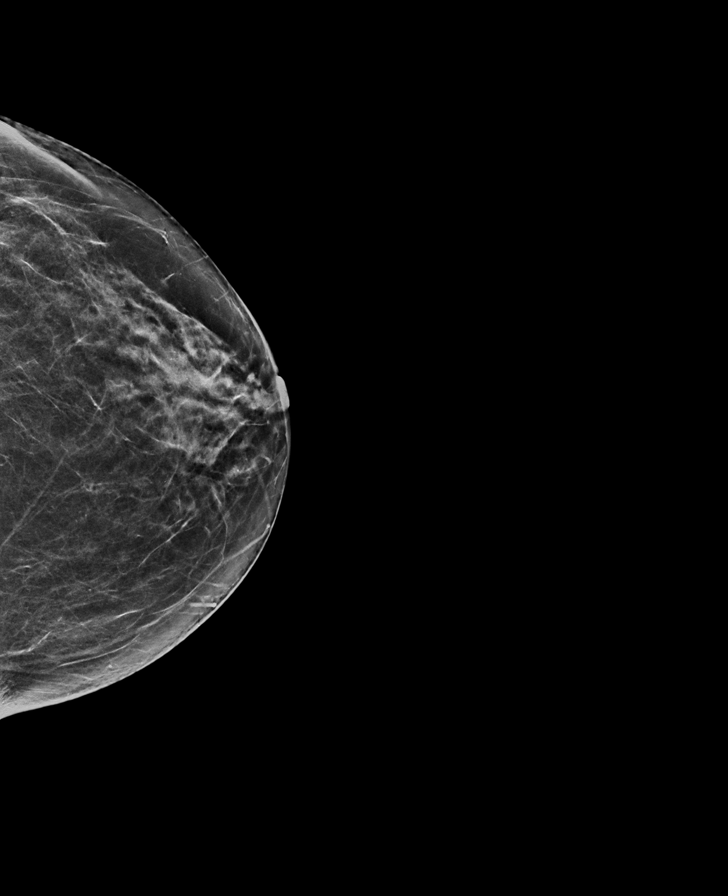

[L CC tomo · tomo slice 31/62.0]
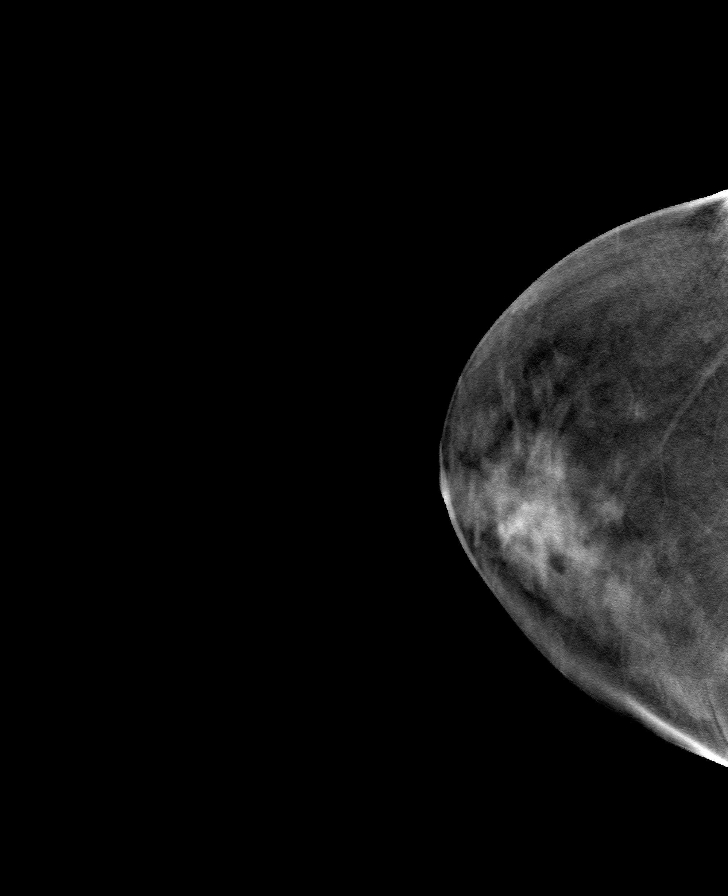

[R MLO tomo · tomo slice 32/63.0]
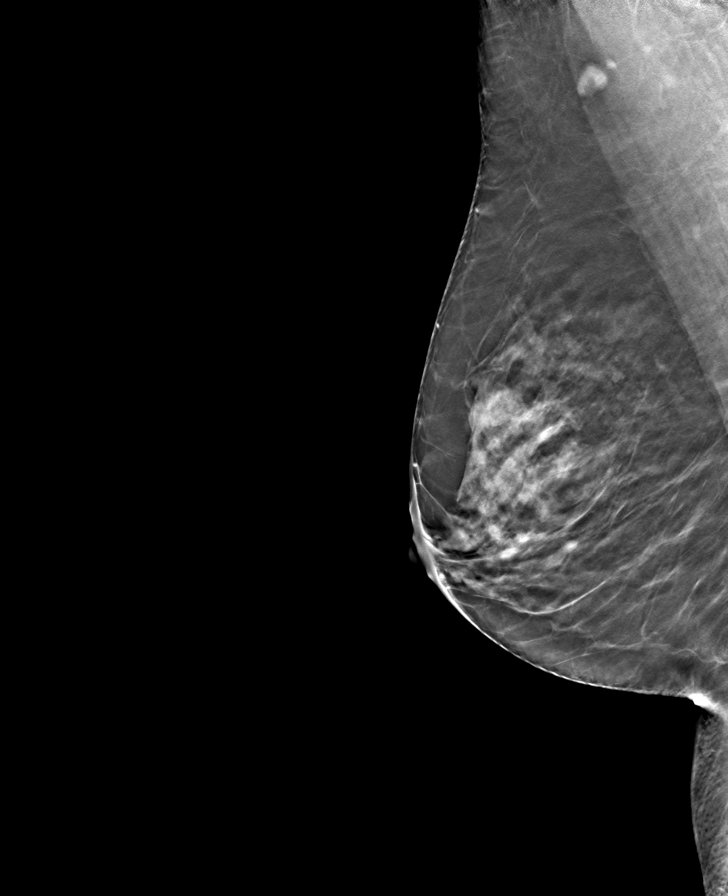

[R CC tomo · tomo slice 30/59.0]
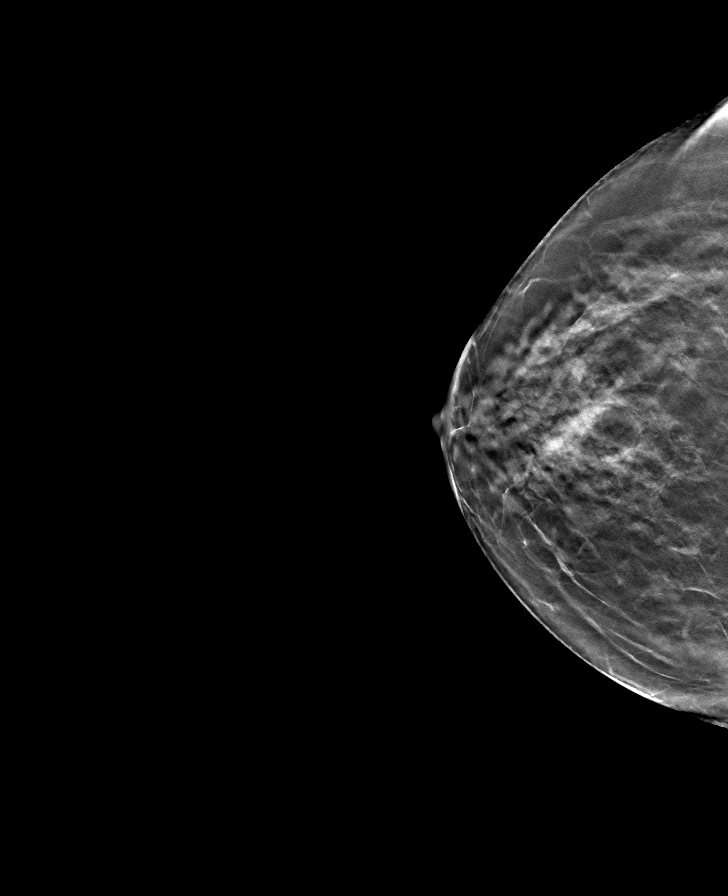

[L MLO tomo · tomo slice 32/63.0]
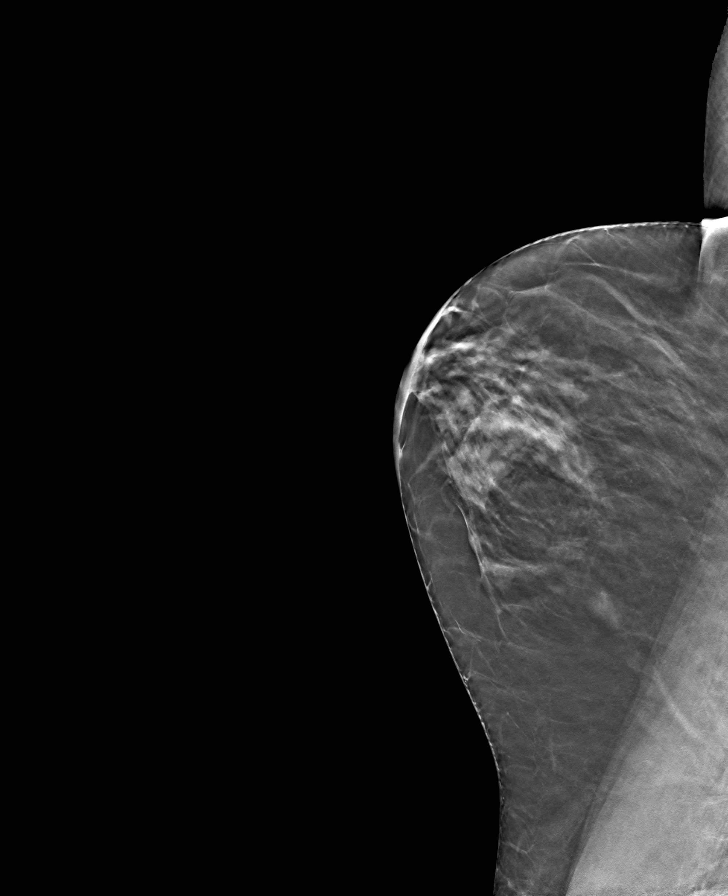

[8 of 24 positions shown; findings below may reference images not displayed]

ACR Breast Density Category c: The breast tissue is heterogeneously
dense, which may obscure small masses.
FINDINGS: There are no findings suspicious for malignancy. Images were
processed with CAD.
IMPRESSION: No mammographic evidence of malignancy. A result letter of this
screening mammogram will be mailed directly to the patient.

RECOMMENDATION:
Screening mammogram in one year. (Code:[5V])

BI-RADS CATEGORY  1: Negative.

## 2019-11-14 ENCOUNTER — Other Ambulatory Visit: Payer: Self-pay | Admitting: Family Medicine

## 2019-11-14 DIAGNOSIS — Z1231 Encounter for screening mammogram for malignant neoplasm of breast: Secondary | ICD-10-CM

## 2019-11-29 ENCOUNTER — Other Ambulatory Visit: Payer: Self-pay | Admitting: Neurology

## 2019-11-29 DIAGNOSIS — R413 Other amnesia: Secondary | ICD-10-CM

## 2019-11-29 DIAGNOSIS — R569 Unspecified convulsions: Secondary | ICD-10-CM

## 2019-12-16 ENCOUNTER — Other Ambulatory Visit: Payer: Self-pay

## 2019-12-16 ENCOUNTER — Ambulatory Visit
Admission: RE | Admit: 2019-12-16 | Discharge: 2019-12-16 | Disposition: A | Discharge status: Home / Self Care | Payer: Medicare Other | Source: Ambulatory Visit | Attending: Neurology | Admitting: Neurology

## 2019-12-16 DIAGNOSIS — R569 Unspecified convulsions: Secondary | ICD-10-CM | POA: Insufficient documentation

## 2019-12-16 DIAGNOSIS — R413 Other amnesia: Secondary | ICD-10-CM | POA: Diagnosis present

## 2019-12-16 IMAGING — MR MR HEAD WO/W CM
15 series · 44 of 48 positions shown · IV contrast (gadavist)
Comparison: None.

CLINICAL DATA: Seizures.  Memory loss.

EXAM:
MRI HEAD WITHOUT AND WITH CONTRAST
TECHNIQUE: Multiplanar, multiecho pulse sequences of the brain and surrounding
structures were obtained without and with intravenous contrast.
CONTRAST:  7mL GADAVIST GADOBUTROL 1 MMOL/ML IV SOLN

[Series 5: T1 · sagittal · 5.0mm · 0.62mm/px · 2 of 25 slices shown (1 of 2)]
[im 1/25]
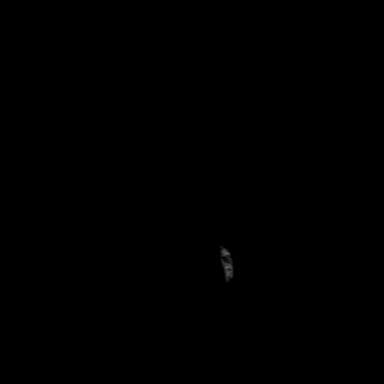
[im 25/25]
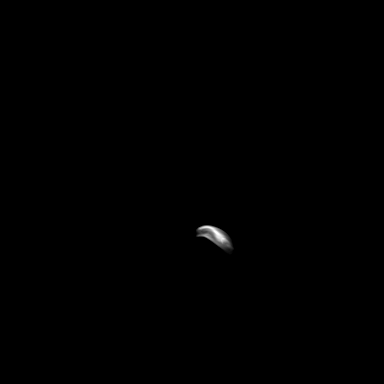

[Series 6: ax dwi_tracew · axial · 3.0mm · 0.60mm/px · z∈[-63,+92]mm · 2 of 48 slices shown]
[im 1/48]
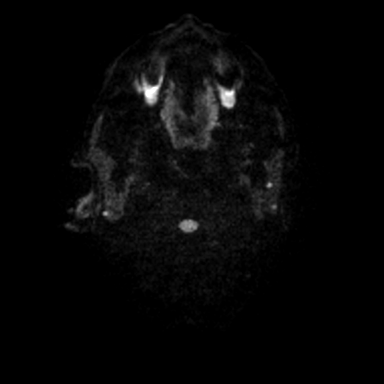
[im 48/48]
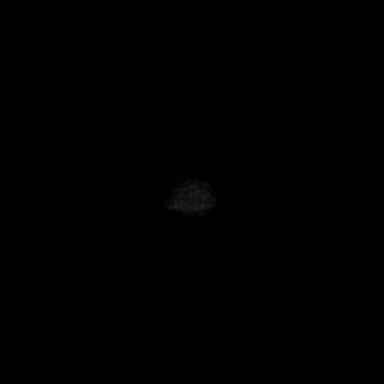

[Series 7: ax dwi_adc · axial · 3.0mm · 0.60mm/px · z∈[-63,+89]mm · 2 of 46 slices shown]
[im 1/46]
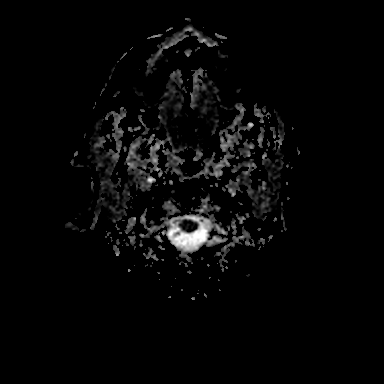
[im 46/46]
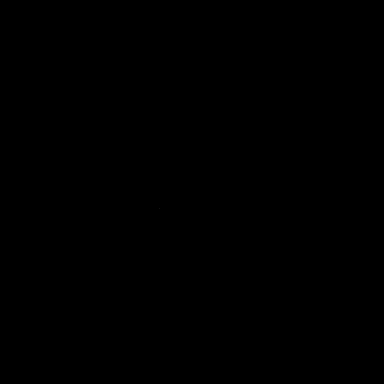

[Series 8: cor dwi_tracew · coronal · 5.0mm · 0.60mm/px · 2 of 40 slices shown]
[im 1/40]
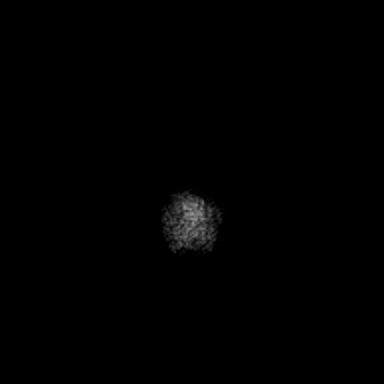
[im 40/40]
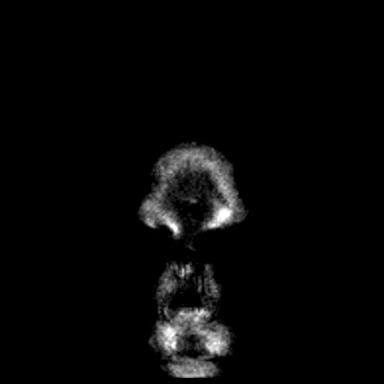

[Series 9: cor dwi_adc · coronal · 5.0mm · 0.60mm/px · 2 of 39 slices shown]
[im 1/39]
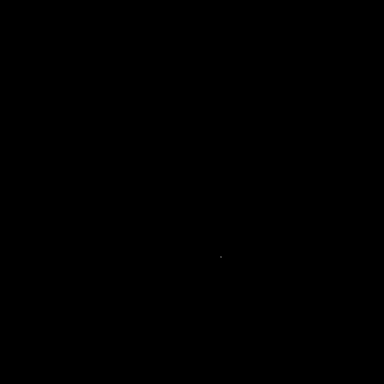
[im 39/39]
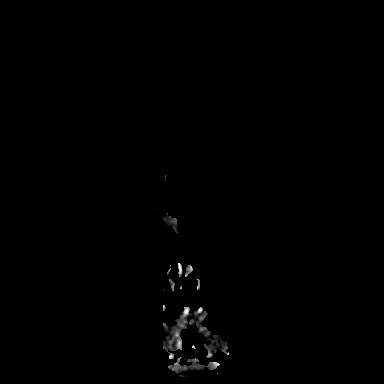

[Series 10: T2 · axial · 5.0mm · 0.53mm/px · 1 of 25 slices shown (1 of 2)]
[im 1/25]
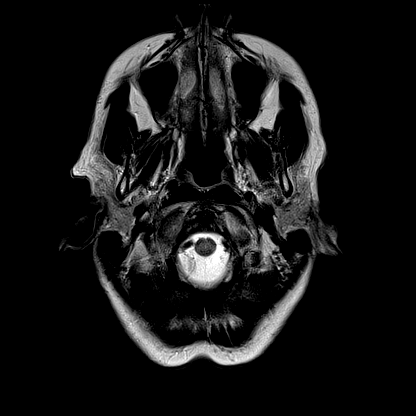

[Series 12: pha_images · axial · 3.0mm · 0.90mm/px · z∈[-74,+103]mm · 3 of 60 slices shown]
[im 1/60]
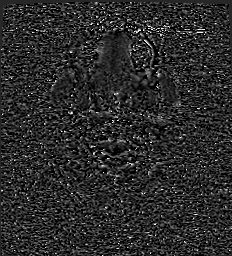
[im 30/60]
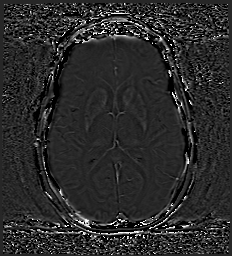
[im 60/60]
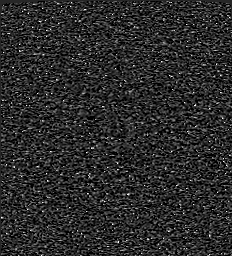

[Series 13: swi_images · axial · 3.0mm · 0.90mm/px · z∈[-74,+103]mm · 3 of 60 slices shown]
[im 1/60]
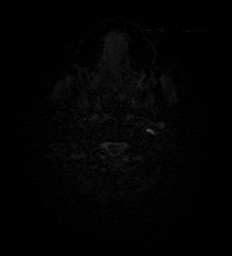
[im 30/60]
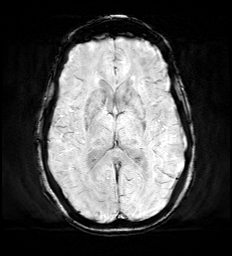
[im 60/60]
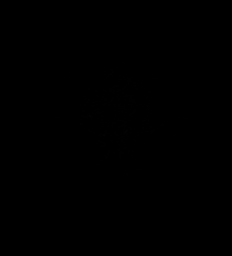

[Series 15: FLAIR · axial · 3.0mm · 0.53mm/px · z∈[-67,+95]mm · 3 of 55 slices shown (1 of 2)]
[im 1/55]
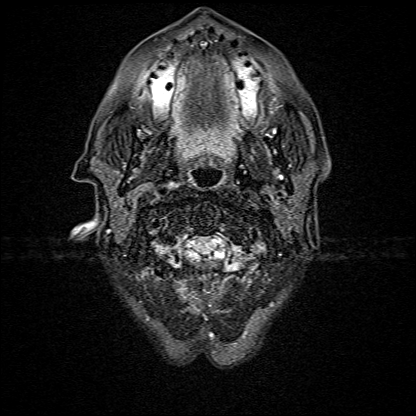
[im 28/55]
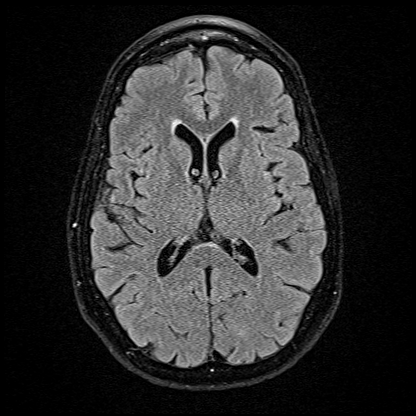
[im 55/55]
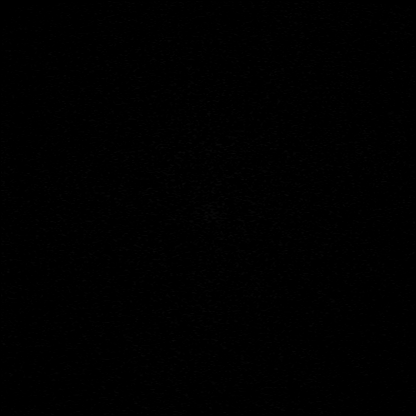

[Series 16: T1 · axial · 1.0mm · 0.98mm/px · z∈[-73,+101]mm · 8 of 175 slices shown (2 of 2)]
[im 1/175]
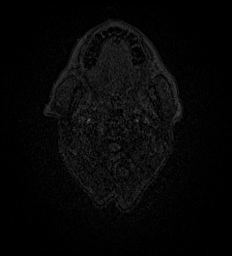
[im 22/175]
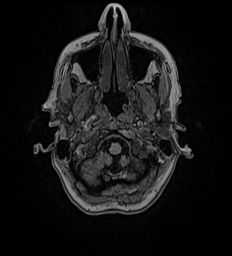
[im 44/175]
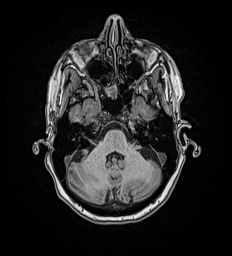
[im 66/175]
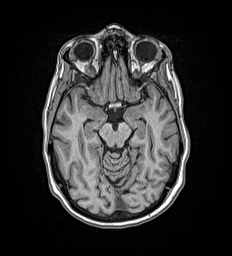
[im 109/175]
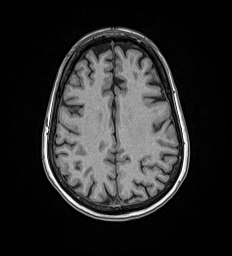
[im 131/175]
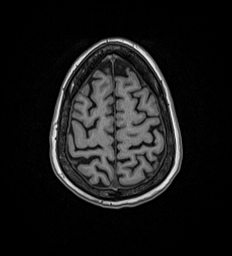
[im 153/175]
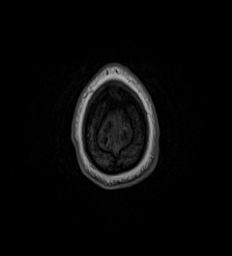
[im 175/175]
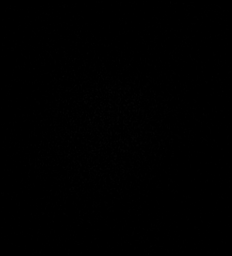

[Series 17: T2 · coronal · 3.0mm · 0.47mm/px · 2 of 35 slices shown (2 of 2)]
[im 1/35]
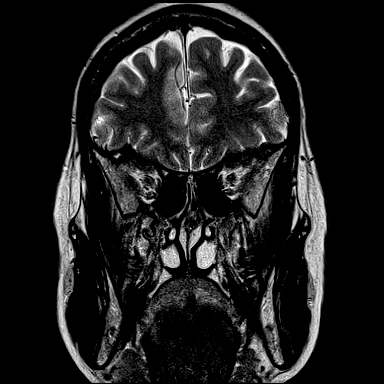
[im 35/35]
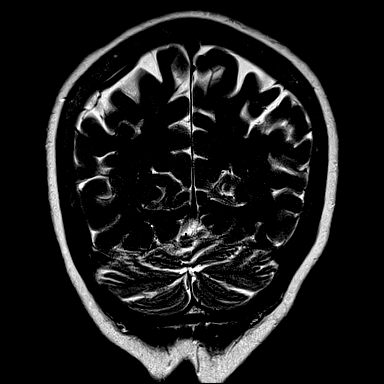

[Series 18: FLAIR · coronal · 3.0mm · 0.47mm/px · 2 of 35 slices shown (2 of 2)]
[im 1/35]
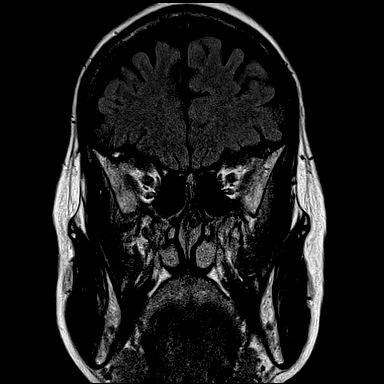
[im 35/35]
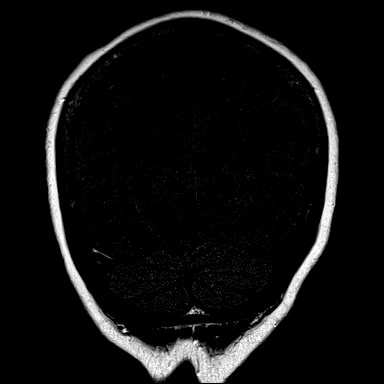

[Series 19: T1 post-contrast · axial · 1.0mm · 0.98mm/px · z∈[-73,+102]mm · 9 of 174 slices shown (1 of 2)]
[im 1/174]
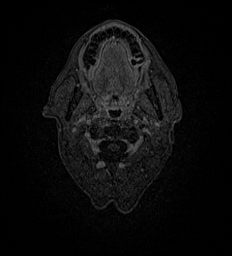
[im 22/174]
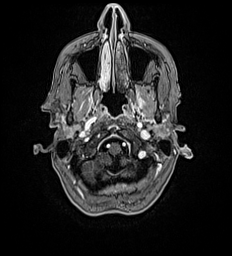
[im 44/174]
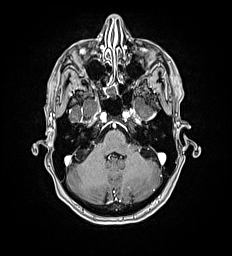
[im 65/174]
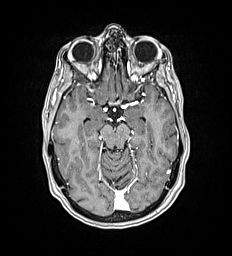
[im 87/174]
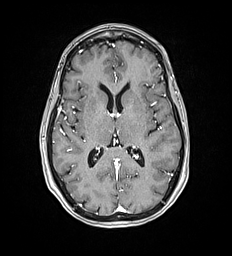
[im 109/174]
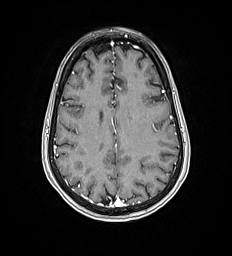
[im 130/174]
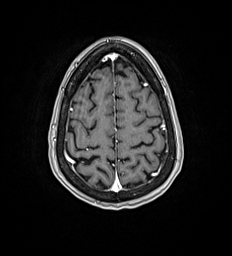
[im 152/174]
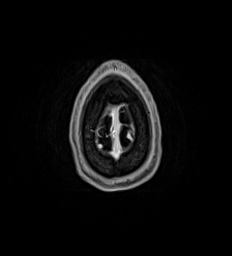
[im 174/174]
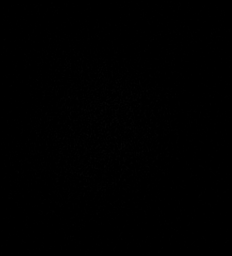

[Series 20: T1 post-contrast · coronal · 5.0mm · 0.57mm/px · 1 of 29 slices shown (2 of 2)]
[im 1/29]
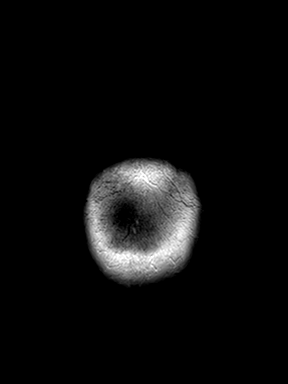

[Series 1022: cor thins rl · coronal · 3.0mm · 0.49mm/px · 2 of 107 slices shown]
[im 1/107]
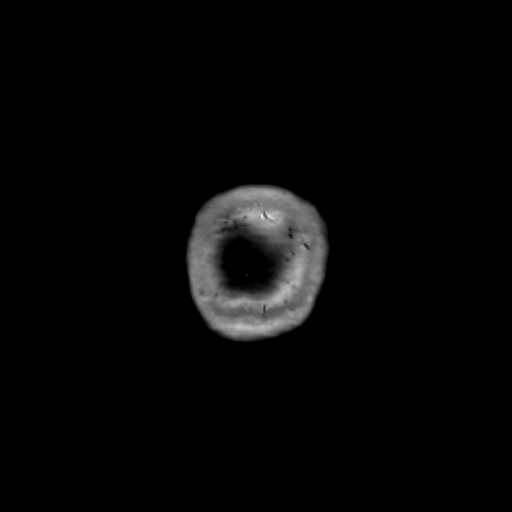
[im 27/107]
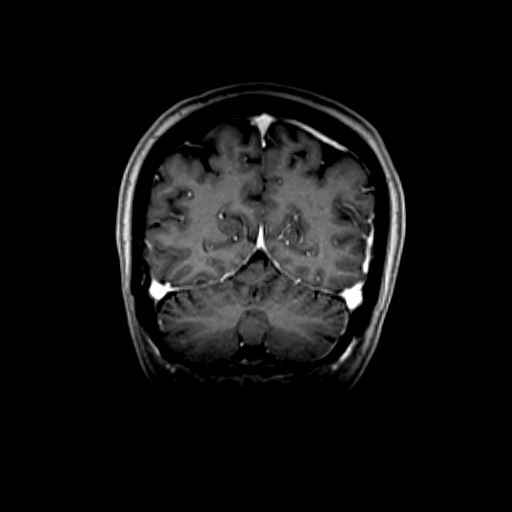

[44 of 48 positions shown; findings below may reference images not displayed]

FINDINGS: Brain: Dedicated thin section imaging through the temporal lobes
demonstrates normal volume and signal of the hippocampi. There is no
evidence of heterotopia or cortical dysplasia.

There is no evidence of an acute infarct, intracranial hemorrhage,
mass, midline shift, or extra-axial fluid collection. The ventricles
and sulci are normal. A few punctate foci of T2 hyperintensity in
the cerebral white matter are within normal limits for age. No
abnormal enhancement is identified.

Vascular: Major intracranial vascular flow voids are preserved.

Skull and upper cervical spine: Unremarkable bone marrow signal.

Sinuses/Orbits: Right sphenoid sinus mucosal thickening. Clear
mastoid air cells. Unremarkable orbits.

Other: None.
IMPRESSION: Unremarkable appearance of the brain for age.

## 2019-12-16 MED ORDER — GADOBUTROL 1 MMOL/ML IV SOLN
7.0000 mL | Freq: Once | INTRAVENOUS | Status: AC | PRN
  Administered 2019-12-16: 7 mL via INTRAVENOUS

## 2020-01-21 ENCOUNTER — Encounter: Payer: Self-pay | Admitting: Emergency Medicine

## 2020-01-21 DIAGNOSIS — R112 Nausea with vomiting, unspecified: Secondary | ICD-10-CM | POA: Diagnosis not present

## 2020-01-21 DIAGNOSIS — R197 Diarrhea, unspecified: Secondary | ICD-10-CM | POA: Insufficient documentation

## 2020-01-21 DIAGNOSIS — Z5321 Procedure and treatment not carried out due to patient leaving prior to being seen by health care provider: Secondary | ICD-10-CM | POA: Diagnosis not present

## 2020-01-21 DIAGNOSIS — R109 Unspecified abdominal pain: Secondary | ICD-10-CM | POA: Diagnosis present

## 2020-01-21 LAB — COMPREHENSIVE METABOLIC PANEL
ALT: 17 U/L (ref 0–44)
AST: 27 U/L (ref 15–41)
Albumin: 4.4 g/dL (ref 3.5–5.0)
Alkaline Phosphatase: 64 U/L (ref 38–126)
Anion gap: 14 (ref 5–15)
BUN: 25 mg/dL — ABNORMAL HIGH (ref 8–23)
CO2: 20 mmol/L — ABNORMAL LOW (ref 22–32)
Calcium: 10 mg/dL (ref 8.9–10.3)
Chloride: 105 mmol/L (ref 98–111)
Creatinine, Ser: 1.31 mg/dL — ABNORMAL HIGH (ref 0.44–1.00)
GFR calc Af Amer: 49 mL/min — ABNORMAL LOW (ref >60)
GFR calc non Af Amer: 43 mL/min — ABNORMAL LOW (ref >60)
Glucose, Bld: 176 mg/dL — ABNORMAL HIGH (ref 70–99)
Potassium: 3.9 mmol/L (ref 3.5–5.1)
Sodium: 139 mmol/L (ref 135–145)
Total Bilirubin: 1.4 mg/dL — ABNORMAL HIGH (ref 0.3–1.2)
Total Protein: 7.9 g/dL (ref 6.5–8.1)

## 2020-01-21 LAB — CBC
HCT: 45.7 % (ref 36.0–46.0)
Hemoglobin: 15.6 g/dL — ABNORMAL HIGH (ref 12.0–15.0)
MCH: 29.1 pg (ref 26.0–34.0)
MCHC: 34.1 g/dL (ref 30.0–36.0)
MCV: 85.1 fL (ref 80.0–100.0)
Platelets: 334 10*3/uL (ref 150–400)
RBC: 5.37 MIL/uL — ABNORMAL HIGH (ref 3.87–5.11)
RDW: 13.1 % (ref 11.5–15.5)
WBC: 17.7 10*3/uL — ABNORMAL HIGH (ref 4.0–10.5)
nRBC: 0 % (ref 0.0–0.2)

## 2020-01-21 LAB — LIPASE, BLOOD: Lipase: 32 U/L (ref 11–51)

## 2020-01-21 MED ORDER — FENTANYL CITRATE (PF) 100 MCG/2ML IJ SOLN
50.0000 ug | INTRAMUSCULAR | Status: AC | PRN
  Administered 2020-01-21 – 2020-01-22 (×2): 50 ug via INTRAVENOUS
  Filled 2020-01-21 (×2): qty 2

## 2020-01-21 MED ORDER — ONDANSETRON HCL 4 MG/2ML IJ SOLN
4.0000 mg | Freq: Once | INTRAMUSCULAR | Status: AC
  Administered 2020-01-21: 23:00:00 4 mg via INTRAVENOUS
  Filled 2020-01-21: qty 2

## 2020-01-21 NOTE — ED Triage Notes (Signed)
Pt arrived via EMS from home with c/o intermittent abdominal pain with N/V/D, starting this afternoon.

## 2020-01-22 ENCOUNTER — Telehealth: Payer: Self-pay | Admitting: Emergency Medicine

## 2020-01-22 ENCOUNTER — Emergency Department
Admission: EM | Admit: 2020-01-22 | Discharge: 2020-01-22 | Disposition: A | Discharge status: Home / Self Care | Payer: Medicare Other | Attending: Emergency Medicine | Admitting: Emergency Medicine

## 2020-01-22 NOTE — Telephone Encounter (Signed)
Called patient due to lwot to inquire about condition and follow up plans.  No answer and no voicemail  

## 2020-02-17 ENCOUNTER — Ambulatory Visit: Payer: Medicare Other | Attending: Internal Medicine

## 2020-02-17 DIAGNOSIS — Z23 Encounter for immunization: Secondary | ICD-10-CM

## 2020-02-17 NOTE — Progress Notes (Signed)
   Covid-[facility] Vaccination [facility]  Name:  Shavonda Wiedman    MRN: [id number] DOB: March 21, 1956  02/17/2020  Ms. Cole-Loy was observed post Covid-19 immunization for 15 minutes without incident. She was provided with Vaccine Information Sheet and instruction to access the [employer]-Safe system.   Ms. Minardi was instructed to call [phone] with any severe reactions post vaccine: Marland Kitchen Difficulty breathing  . Swelling of face and throat  . A fast heartbeat  . A bad rash all over body  . Dizziness and weakness

## 2020-06-15 ENCOUNTER — Other Ambulatory Visit: Payer: Self-pay | Admitting: Neurology

## 2020-06-15 ENCOUNTER — Other Ambulatory Visit (HOSPITAL_COMMUNITY): Payer: Self-pay | Admitting: Neurology

## 2020-06-15 DIAGNOSIS — R292 Abnormal reflex: Secondary | ICD-10-CM

## 2020-06-24 ENCOUNTER — Ambulatory Visit
Admission: RE | Admit: 2020-06-24 | Discharge: 2020-06-24 | Disposition: A | Discharge status: Home / Self Care | Payer: Medicare Other | Source: Ambulatory Visit | Attending: Neurology | Admitting: Neurology

## 2020-06-24 ENCOUNTER — Other Ambulatory Visit: Payer: Self-pay

## 2020-06-24 DIAGNOSIS — R292 Abnormal reflex: Secondary | ICD-10-CM | POA: Diagnosis present

## 2020-06-24 IMAGING — MR MR THORACIC SPINE W/O CM
6 series · 29 of 48 positions shown · non-contrast
Comparison: None.

CLINICAL DATA: Fell off horse 12 years ago. Progressive worsening
of mid back pain.

EXAM:
MRI THORACIC SPINE WITHOUT CONTRAST
TECHNIQUE: Multiplanar, multisequence MR imaging of the thoracic spine was
performed. No intravenous contrast was administered.

[Series 16: T1 · sagittal · 5.0mm · 1.88mm/px · 2 of 9 slices shown (1 of 2)]
[im 1/9]
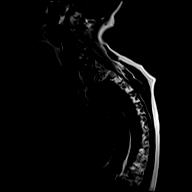
[im 9/9]
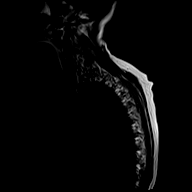

[Series 17: T2 · sagittal · 3.0mm · 1.06mm/px · 6 of 17 slices shown (1 of 2)]
[im 1/17]
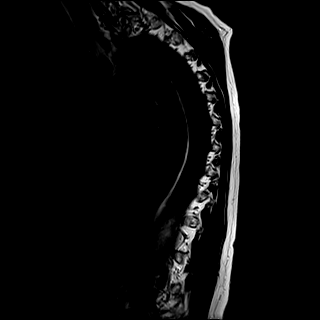
[im 4/17]
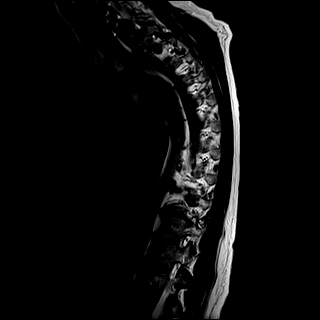
[im 7/17]
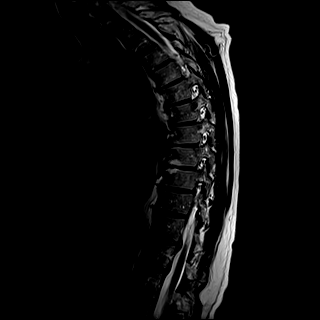
[im 10/17]
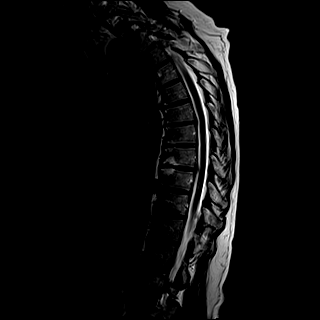
[im 13/17]
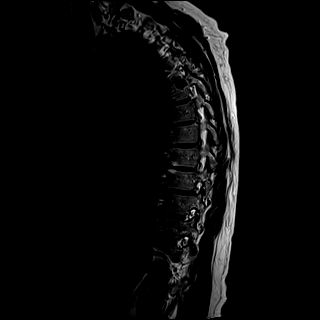
[im 17/17]
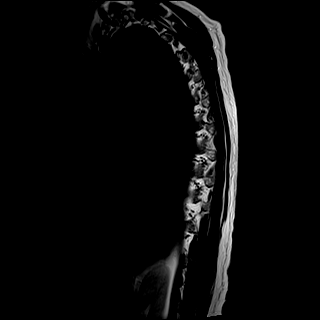

[Series 18: T1 · sagittal · 3.0mm · 1.06mm/px · 6 of 17 slices shown (2 of 2)]
[im 1/17]
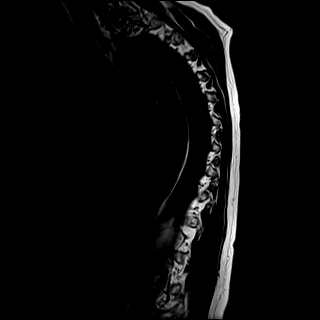
[im 4/17]
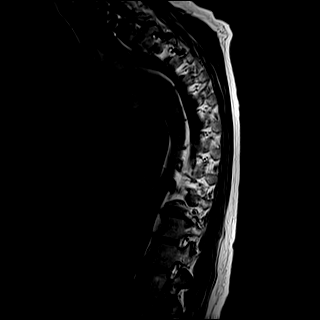
[im 7/17]
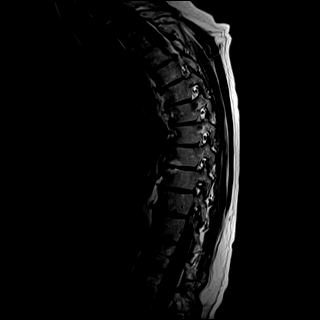
[im 10/17]
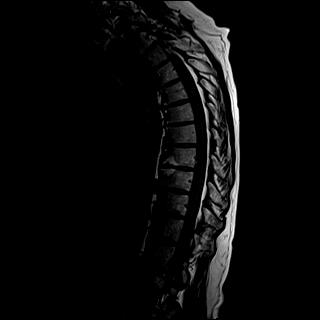
[im 13/17]
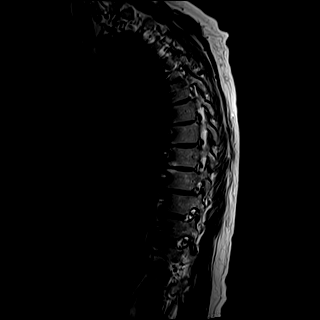
[im 17/17]
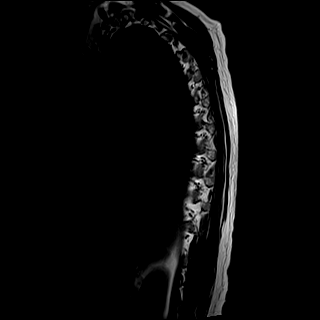

[Series 19: STIR · sagittal · 3.0mm · 0.53mm/px · 6 of 17 slices shown]
[im 1/17]
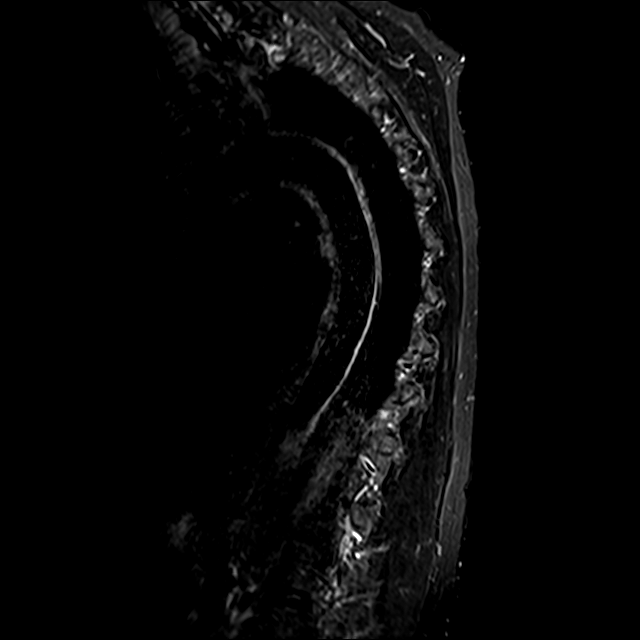
[im 4/17]
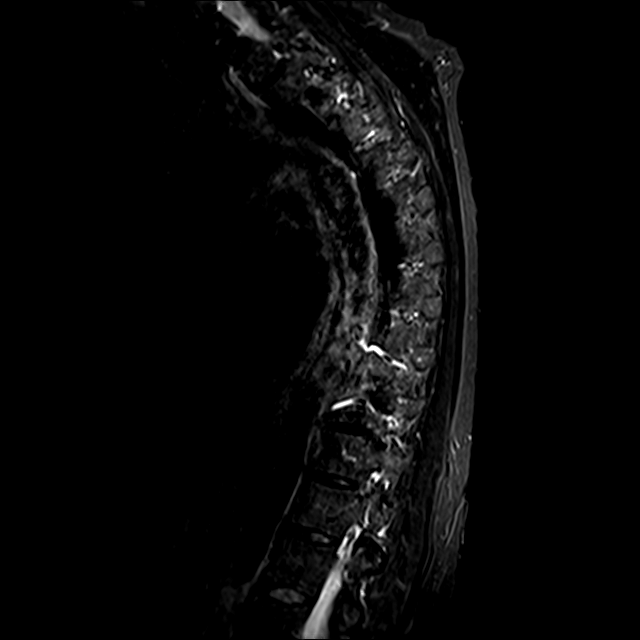
[im 7/17]
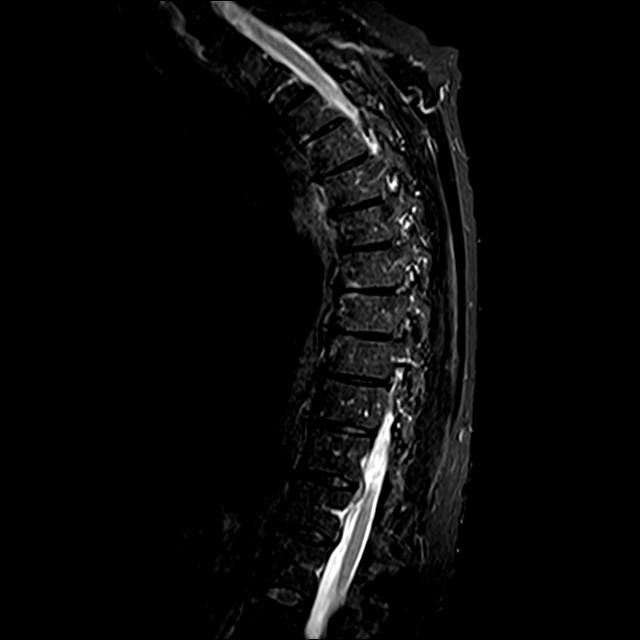
[im 10/17]
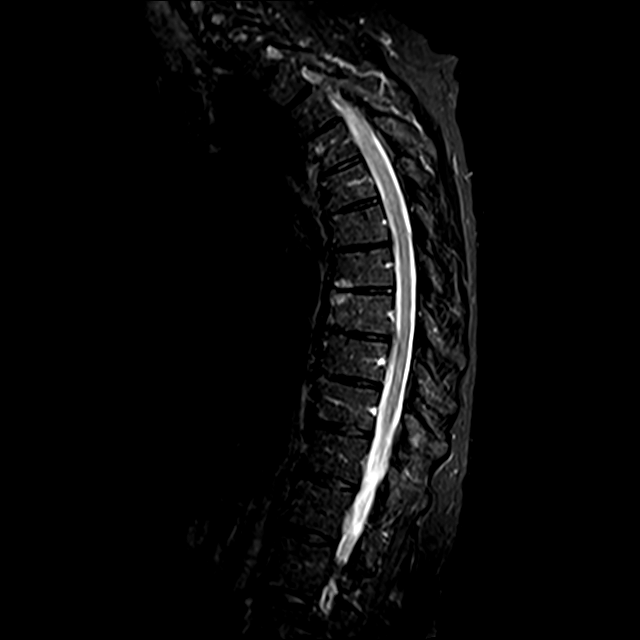
[im 13/17]
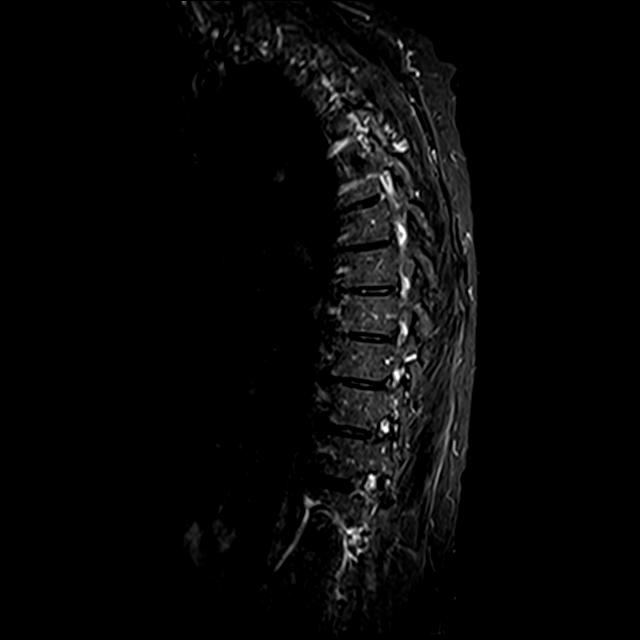
[im 17/17]
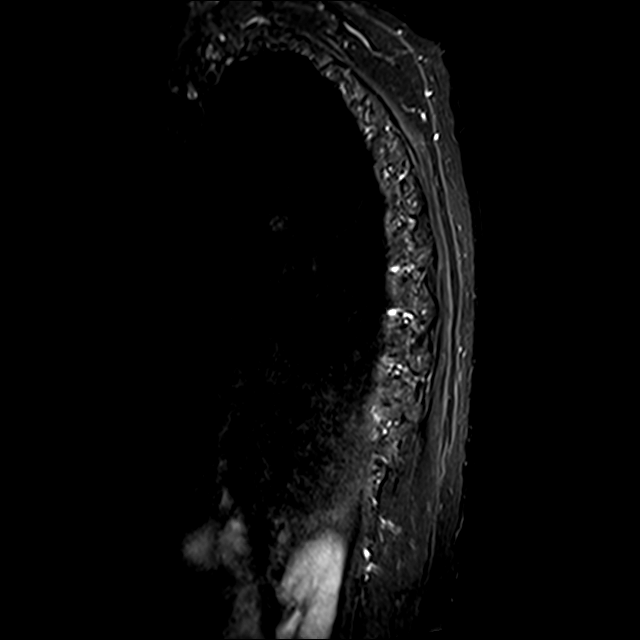

[Series 21: T2 · axial · 4.0mm · 0.59mm/px · z∈[-339,-30]mm · 8 of 39 slices shown (2 of 2)]
[im 1/39]
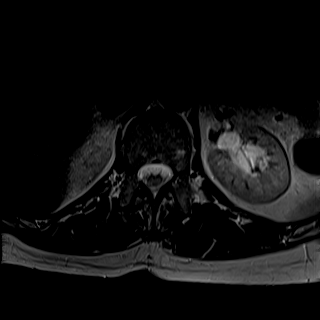
[im 6/39]
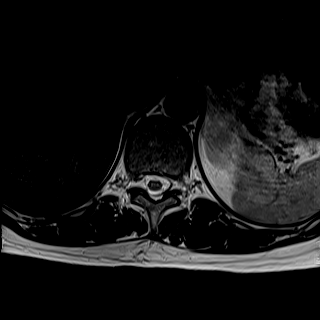
[im 12/39]
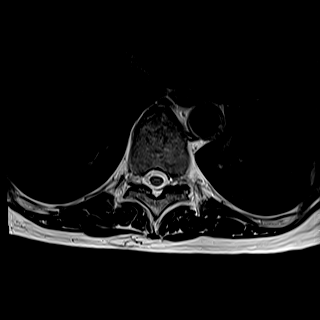
[im 18/39]
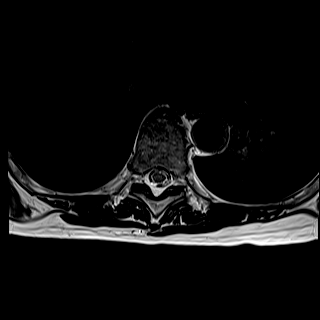
[im 21/39]
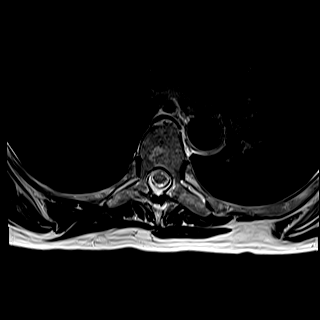
[im 27/39]
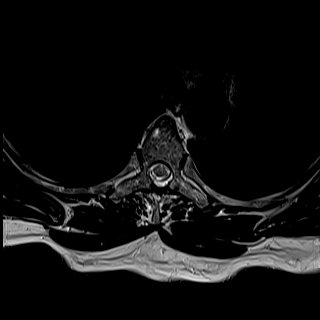
[im 33/39]
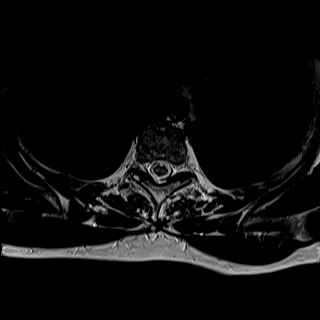
[im 39/39]
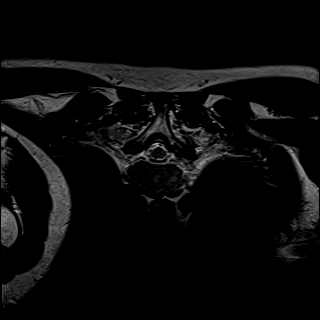

[Series 22: GRE · axial · 4.0mm · 0.37mm/px · 1 of 39 slices shown]
[im 1/39]
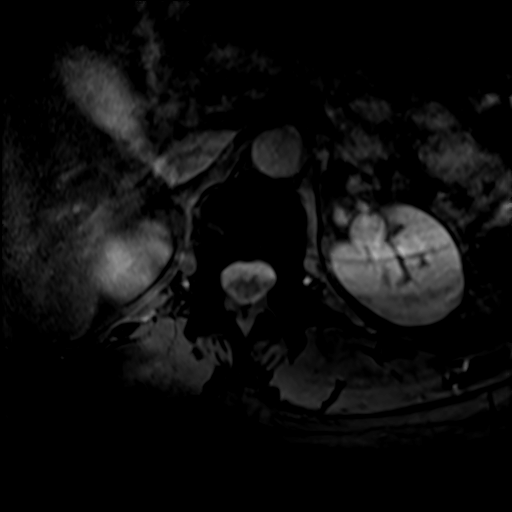

[29 of 48 positions shown; findings below may reference images not displayed]

FINDINGS: Alignment: Mildly exaggerated thoracic kyphosis. No substantial
sagittal subluxation.

Vertebrae: Degenerative endplate signal changes about the anterior
T7-T8 disc. Vertebral body heights are maintained. No specific
evidence of acute fracture, discitis/osteomyelitis, or suspicious
bone lesion.

Cord:  Normal cord signal.

Paraspinal and other soft tissues: Partially imaged left parapelvic
cyst. Otherwise, unremarkable.

Disc levels:

ACDF is partially imaged on the scout series in the cervical spine,
spanning C5-C7.

Disc height loss in the midthoracic spine. Degenerative endplate
signal changes anteriorly at the T8-T9 level.

Small disc bulge at T6-T7 effaces ventral CSF and flattens the
ventral cord without significant canal stenosis.

Left eccentric disc protrusion at T8-T9 contacts and deforms the
left ventral cord (series 21, image 26) without significant overall
canal stenosis.

Left eccentric disc protrusion at T11-T12 effaces ventral CSF
without significant canal stenosis.

Multilevel facet arthropathy with mild multilevel foraminal
stenosis, including mild left foraminal stenosis at T8-T9 and
T9-T10.
IMPRESSION: 1. Multiple disc protrusions, as detailed above. The largest is a
left eccentric disc protrusion at T8-T9 that contacts and deforms
the left ventral cord. No significant central stenosis.
2. Mild multilevel foraminal stenosis, including on the left at
T8-T9 and T9-T10.

## 2020-10-09 ENCOUNTER — Other Ambulatory Visit: Payer: Self-pay | Admitting: Family Medicine

## 2020-10-09 DIAGNOSIS — Z1231 Encounter for screening mammogram for malignant neoplasm of breast: Secondary | ICD-10-CM

## 2020-10-20 ENCOUNTER — Ambulatory Visit
Admission: RE | Admit: 2020-10-20 | Discharge: 2020-10-20 | Disposition: A | Discharge status: Home / Self Care | Payer: Medicare Other | Source: Ambulatory Visit | Attending: Family Medicine | Admitting: Family Medicine

## 2020-10-20 ENCOUNTER — Other Ambulatory Visit: Payer: Self-pay

## 2020-10-20 DIAGNOSIS — Z1231 Encounter for screening mammogram for malignant neoplasm of breast: Secondary | ICD-10-CM | POA: Diagnosis present

## 2020-10-20 IMAGING — MG MM DIGITAL SCREENING BILAT W/ TOMO AND CAD
8 of 14 series · 8 of 40 positions shown · non-contrast
Comparison: Previous exam(s).

CLINICAL DATA: Screening.

EXAM:
DIGITAL SCREENING BILATERAL MAMMOGRAM WITH TOMOSYNTHESIS AND CAD
TECHNIQUE: Bilateral screening digital craniocaudal and mediolateral oblique
mammograms were obtained. Bilateral screening digital breast
tomosynthesis was performed. The images were evaluated with
computer-aided detection.

[R CC synth-2D (1 of 2)]
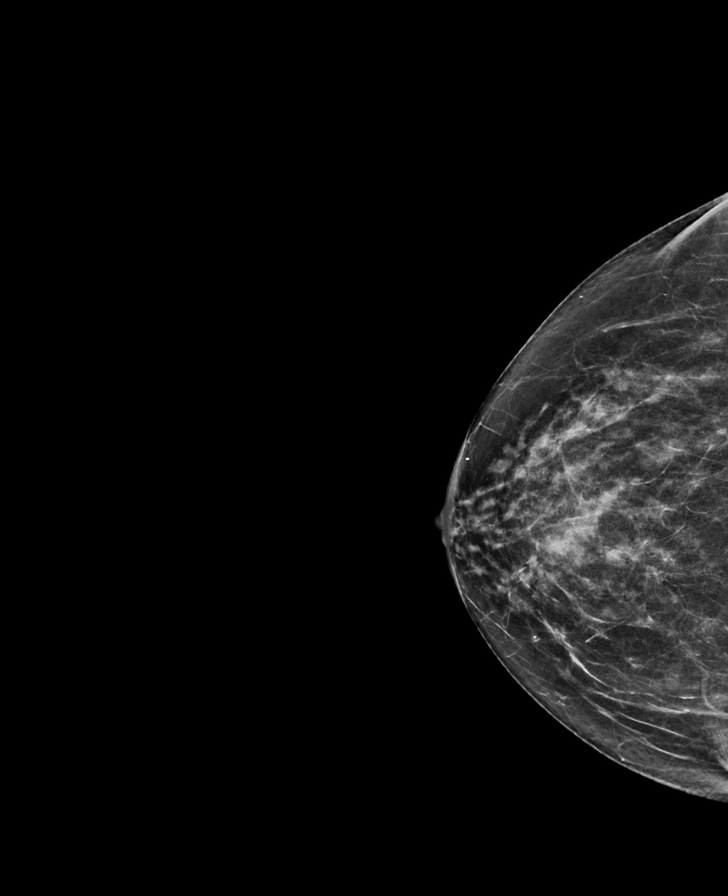

[R MLO synth-2D]
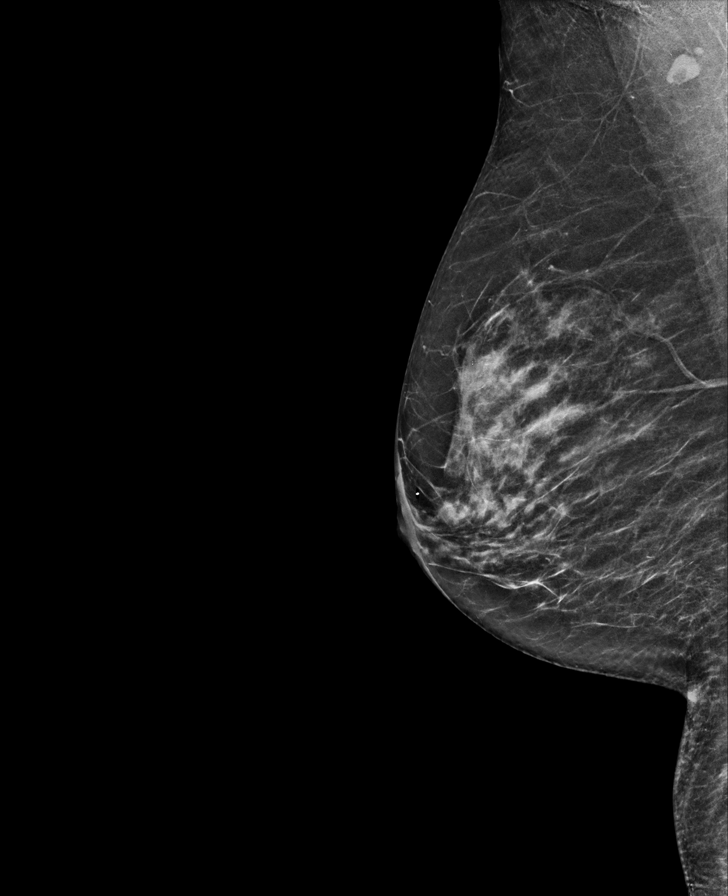

[L CC synth-2D (1 of 2)]
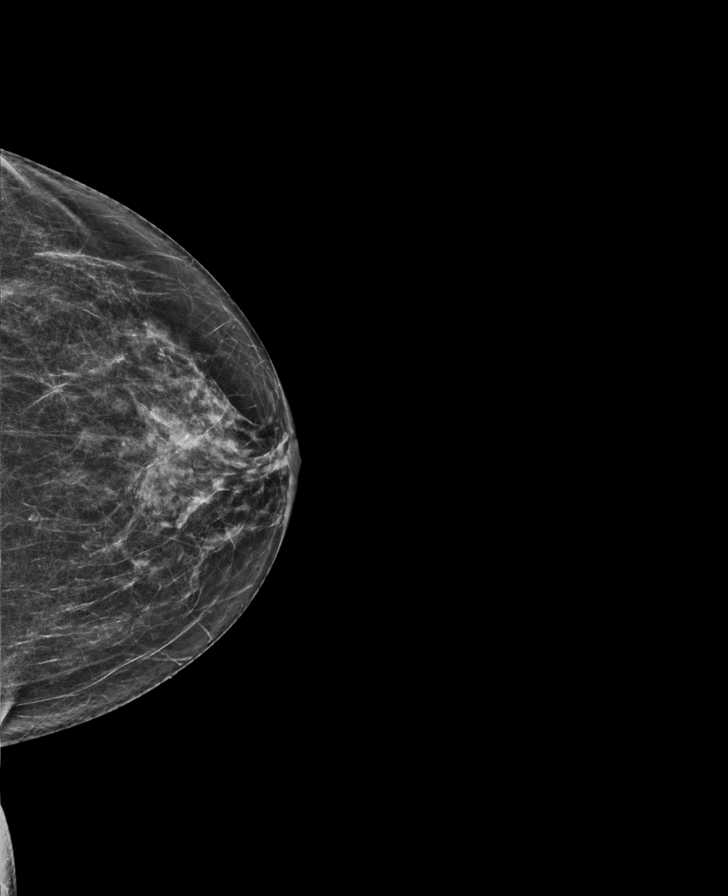

[R CC synth-2D (2 of 2)]
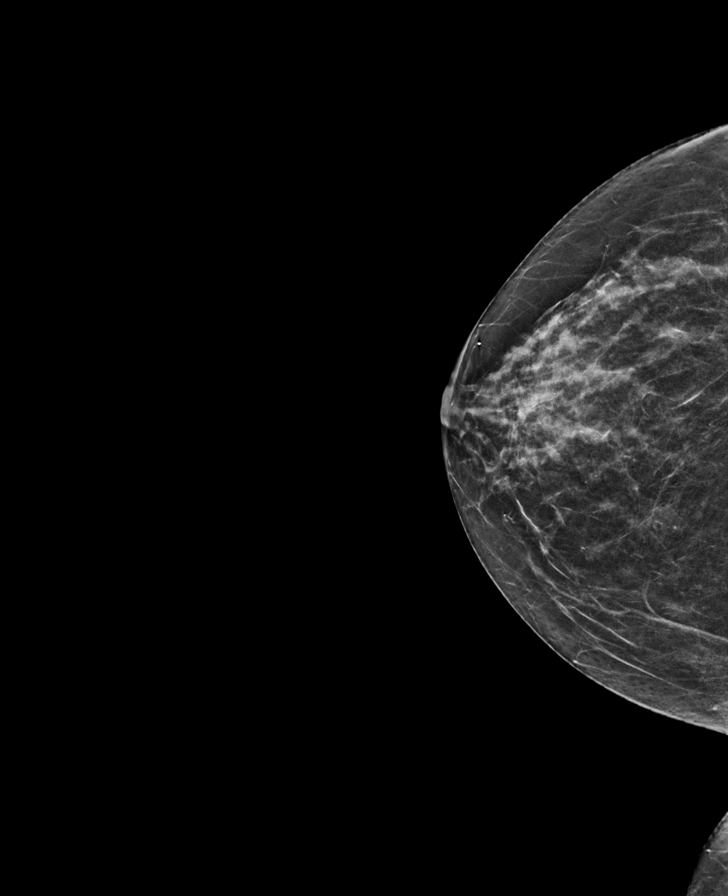

[L MLO synth-2D (1 of 2)]
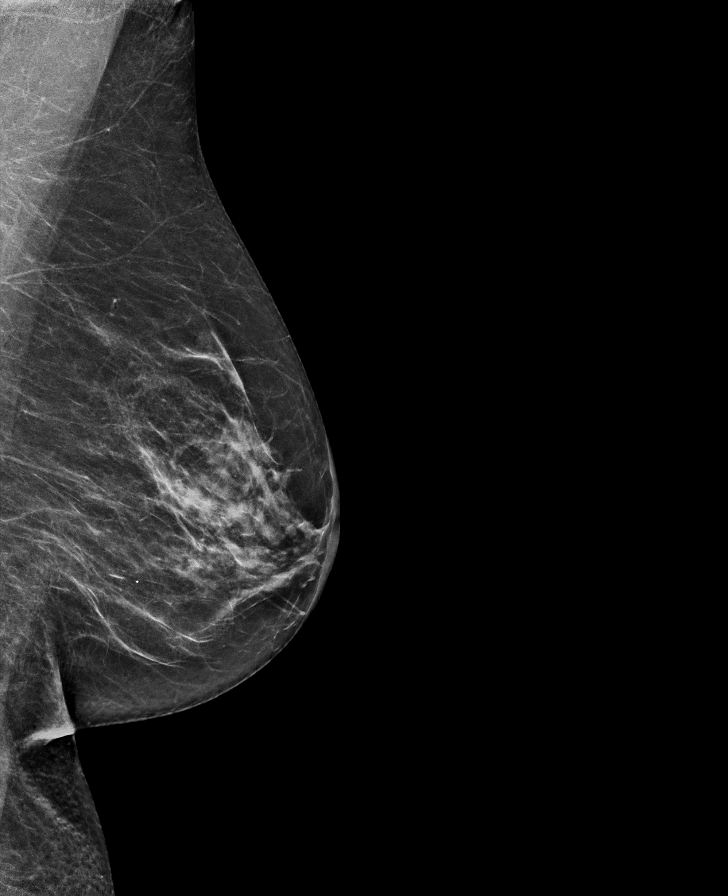

[L MLO synth-2D (2 of 2)]
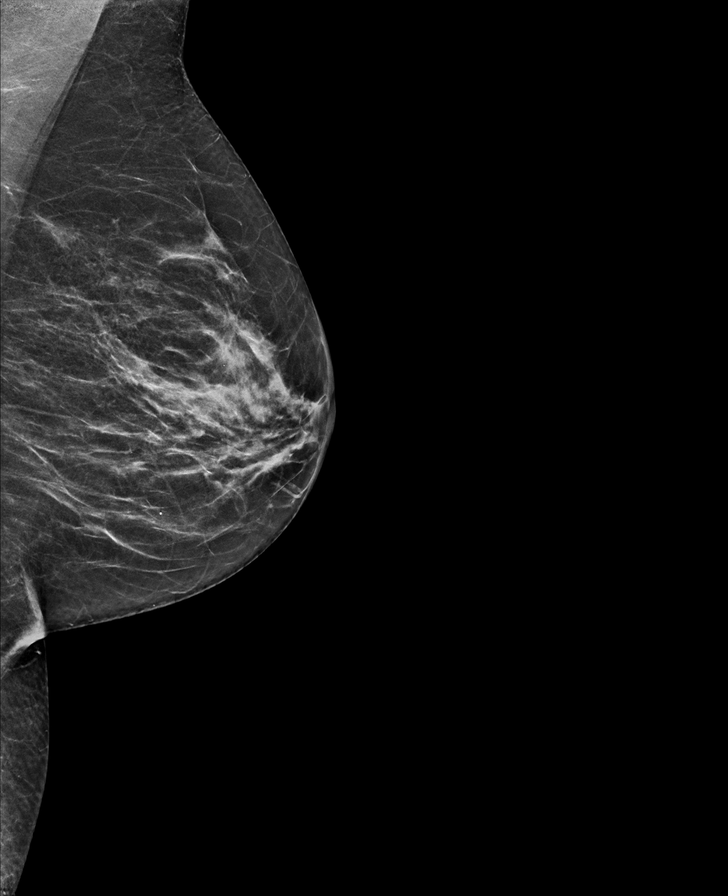

[L CC synth-2D (2 of 2)]
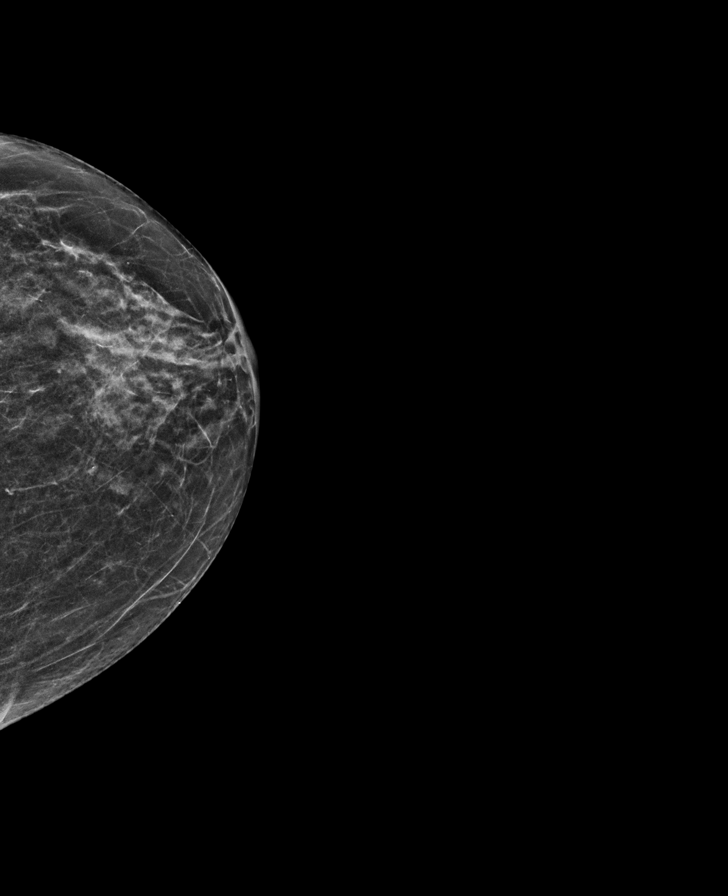

[R CC tomo · tomo slice 28/55.0]
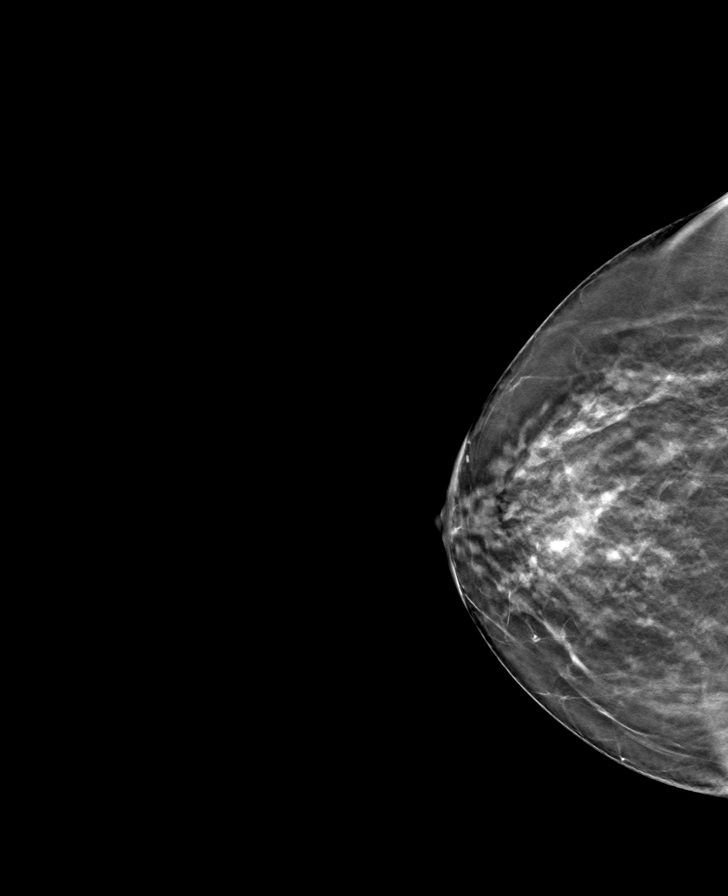

[8 of 40 positions shown; findings below may reference images not displayed]

ACR Breast Density Category c: The breast tissue is heterogeneously
dense, which may obscure small masses.
FINDINGS: There are no findings suspicious for malignancy.
IMPRESSION: No mammographic evidence of malignancy. A result letter of this
screening mammogram will be mailed directly to the patient.

RECOMMENDATION:
Screening mammogram in one year. (Code:[V2])

BI-RADS CATEGORY  1: Negative.

## 2020-11-24 ENCOUNTER — Other Ambulatory Visit: Payer: Self-pay | Admitting: Neurology

## 2020-11-24 ENCOUNTER — Other Ambulatory Visit (HOSPITAL_COMMUNITY): Payer: Self-pay | Admitting: Neurology

## 2020-11-24 DIAGNOSIS — R569 Unspecified convulsions: Secondary | ICD-10-CM

## 2020-11-27 ENCOUNTER — Ambulatory Visit: Payer: Medicare Other

## 2020-12-18 ENCOUNTER — Ambulatory Visit
Admission: RE | Admit: 2020-12-18 | Discharge: 2020-12-18 | Disposition: A | Discharge status: Home / Self Care | Payer: Medicare Other | Source: Ambulatory Visit | Attending: Neurology | Admitting: Neurology

## 2020-12-18 ENCOUNTER — Other Ambulatory Visit: Payer: Self-pay

## 2020-12-18 DIAGNOSIS — R569 Unspecified convulsions: Secondary | ICD-10-CM | POA: Diagnosis not present

## 2020-12-18 IMAGING — MR MR HEAD WO/W CM
12 of 15 series · 36 of 48 positions shown · IV contrast (gadavist)
Comparison: Brain MRI [DATE].

CLINICAL DATA: 64-year-old female with partial seizures diagnosed
in [JE]. Breakthrough symptoms last month.

EXAM:
MRI HEAD WITHOUT AND WITH CONTRAST
TECHNIQUE: Multiplanar, multiecho pulse sequences of the brain and surrounding
structures were obtained without and with intravenous contrast.
CONTRAST:  7.5mL GADAVIST GADOBUTROL 1 MMOL/ML IV SOLN

[Series 5: T1 · sagittal · 5.0mm · 0.62mm/px · 1 of 21 slices shown (1 of 2)]
[im 1/21]
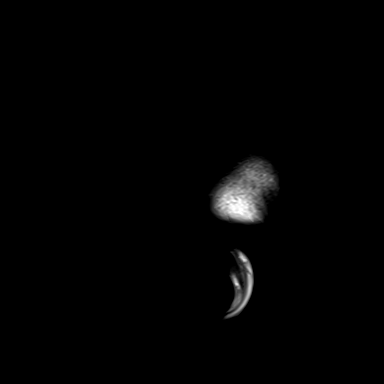

[Series 6: ax dwi_tracew · axial · 3.0mm · 0.65mm/px · z∈[-81,+73]mm · 3 of 48 slices shown]
[im 1/48]
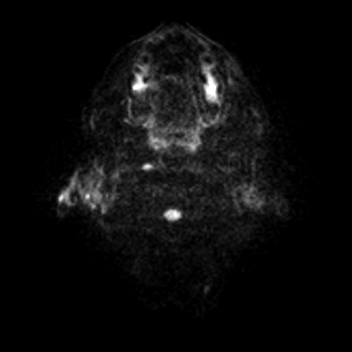
[im 24/48]
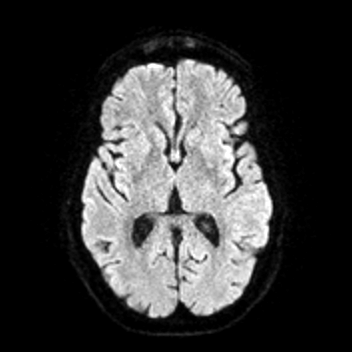
[im 48/48]
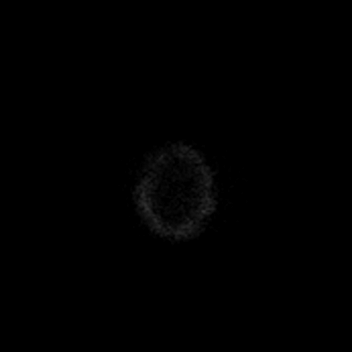

[Series 7: ax dwi_adc · axial · 3.0mm · 0.65mm/px · z∈[-81,+67]mm · 2 of 46 slices shown]
[im 1/46]
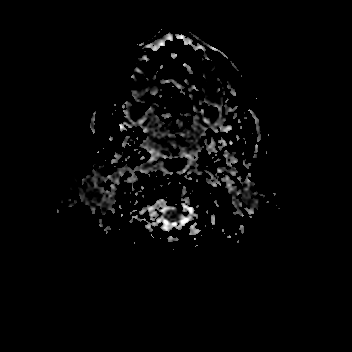
[im 46/46]
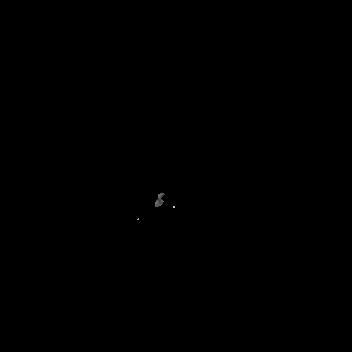

[Series 8: cor dwi_tracew · coronal · 5.0mm · 0.65mm/px · 2 of 38 slices shown]
[im 1/38]
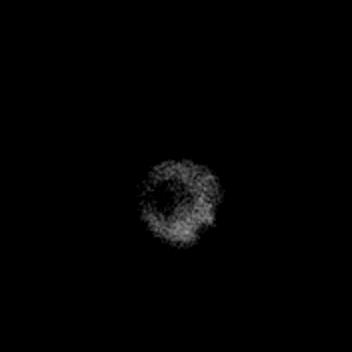
[im 38/38]
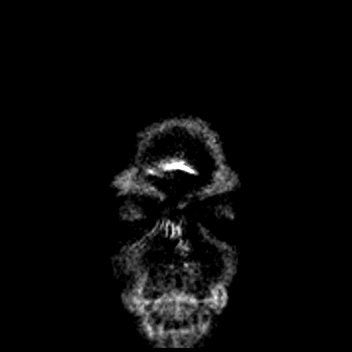

[Series 9: cor dwi_adc · coronal · 5.0mm · 0.65mm/px · 2 of 36 slices shown]
[im 1/36]
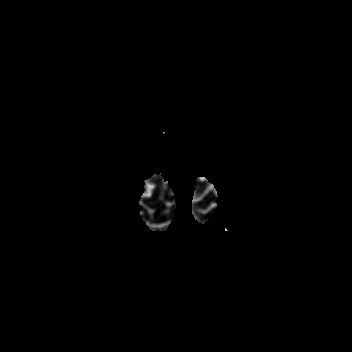
[im 36/36]
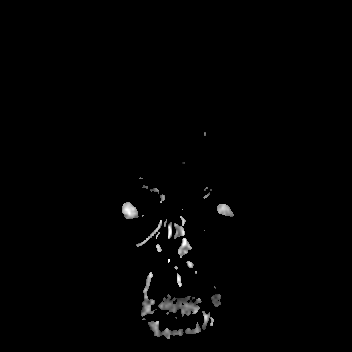

[Series 10: T2 · axial · 5.0mm · 0.53mm/px · 1 of 27 slices shown (1 of 2)]
[im 1/27]
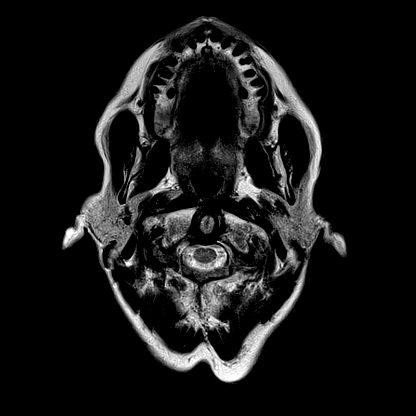

[Series 15: FLAIR · axial · 3.0mm · 0.53mm/px · z∈[-82,+73]mm · 3 of 53 slices shown (1 of 2)]
[im 1/53]
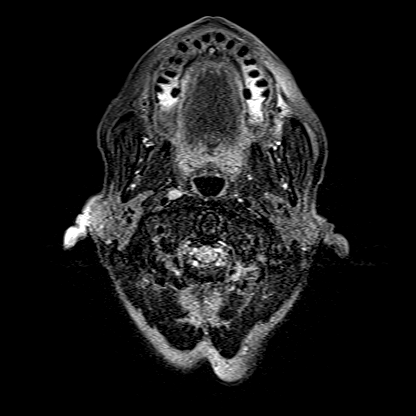
[im 27/53]
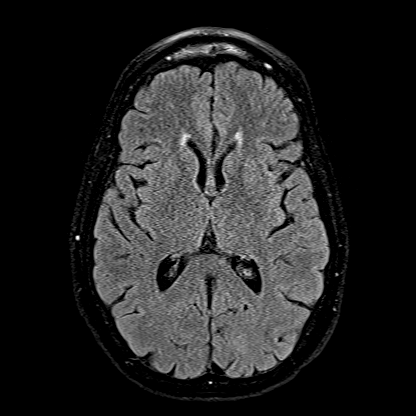
[im 53/53]
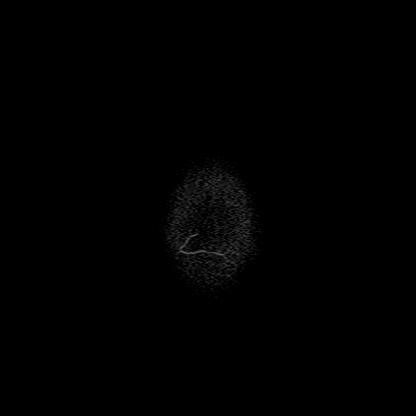

[Series 16: T1 · axial · 1.0mm · 0.98mm/px · z∈[-82,+76]mm · 8 of 160 slices shown (2 of 2)]
[im 1/160]
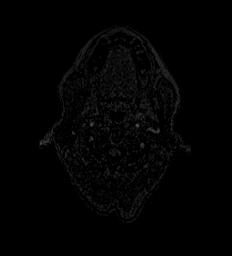
[im 23/160]
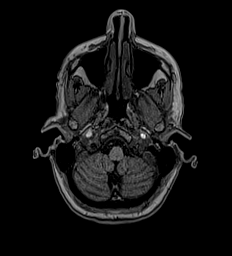
[im 46/160]
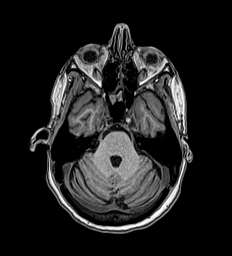
[im 69/160]
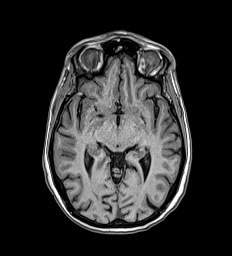
[im 91/160]
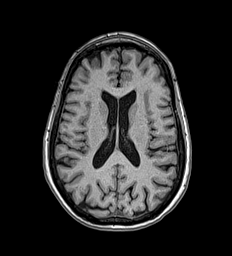
[im 114/160]
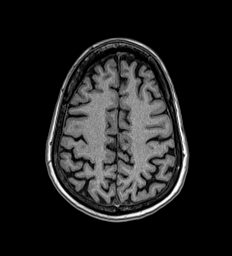
[im 137/160]
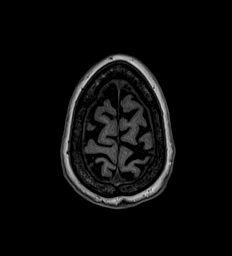
[im 160/160]
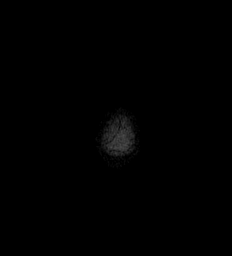

[Series 17: T2 · coronal · 3.0mm · 0.23mm/px · 2 of 35 slices shown (2 of 2)]
[im 1/35]
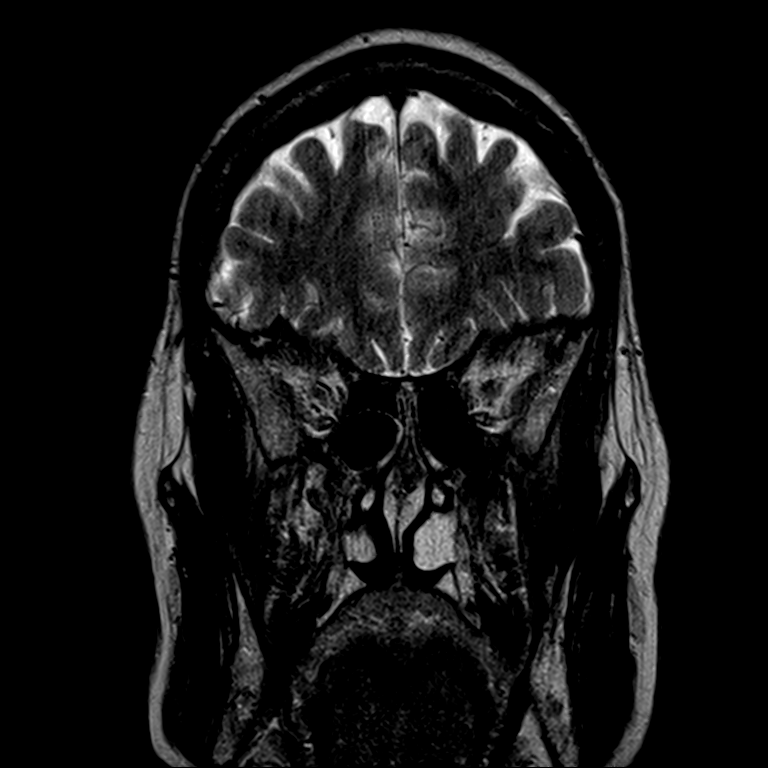
[im 35/35]
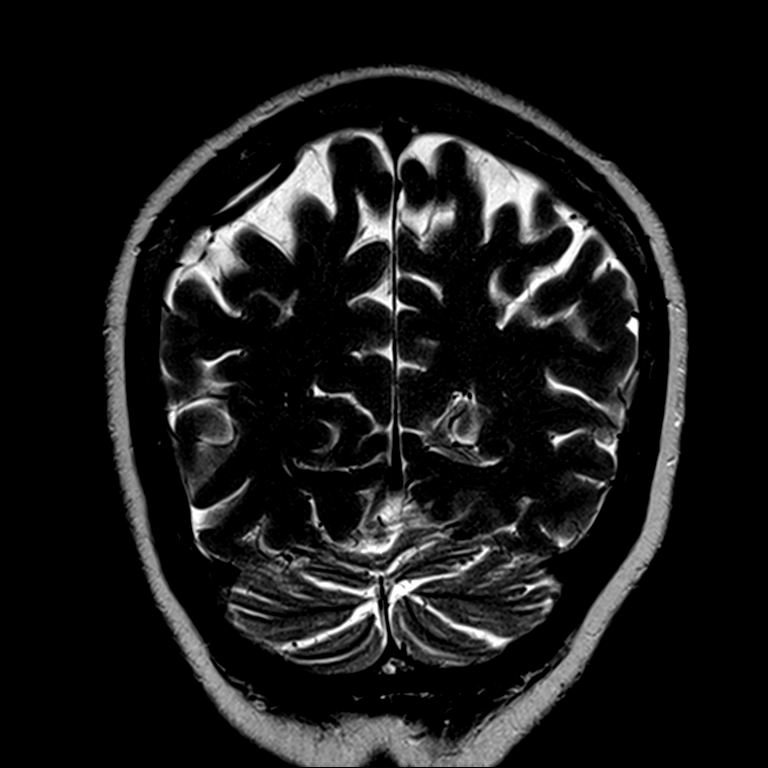

[Series 18: FLAIR · coronal · 3.0mm · 0.35mm/px · 2 of 35 slices shown (2 of 2)]
[im 1/35]
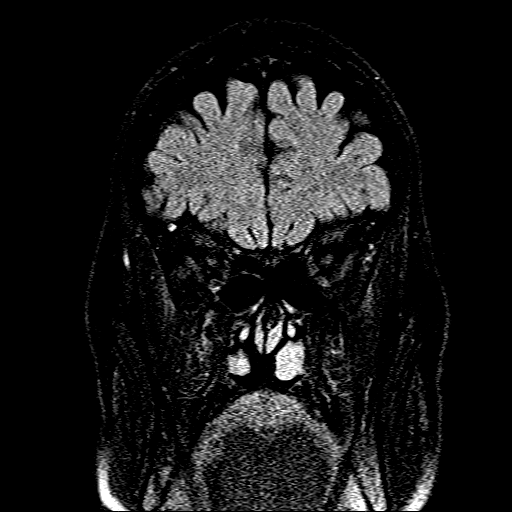
[im 35/35]
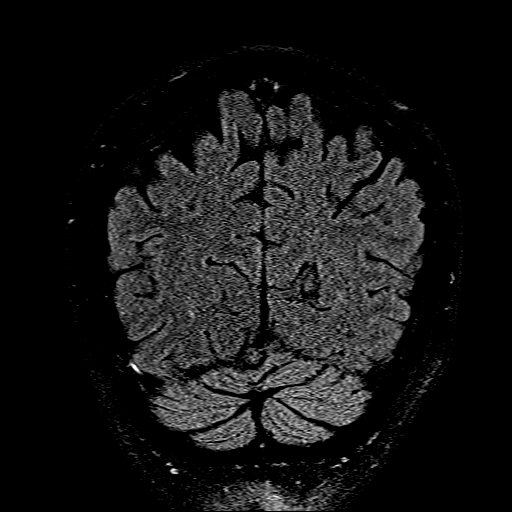

[Series 19: T1 post-contrast · axial · 1.0mm · 0.98mm/px · z∈[-82,+76]mm · 8 of 160 slices shown (1 of 2)]
[im 1/160]
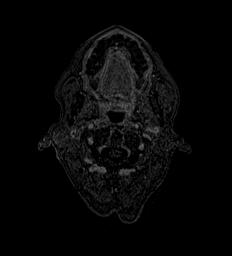
[im 23/160]
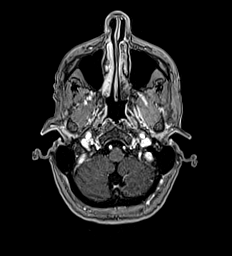
[im 46/160]
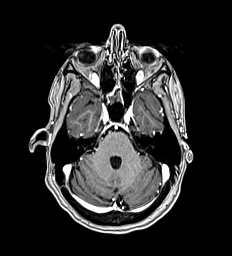
[im 69/160]
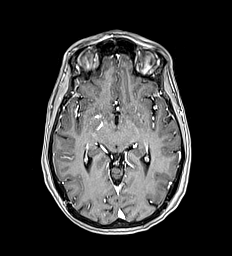
[im 91/160]
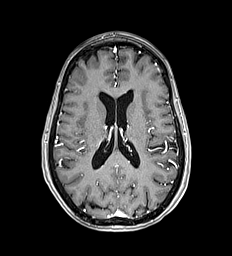
[im 114/160]
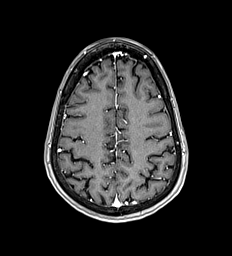
[im 137/160]
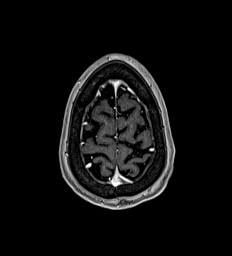
[im 160/160]
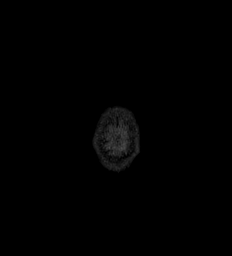

[Series 20: T1 post-contrast · coronal · 5.0mm · 0.57mm/px · 2 of 29 slices shown (2 of 2)]
[im 1/29]
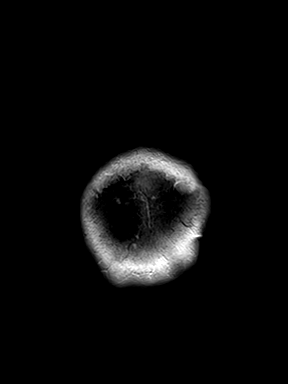
[im 29/29]
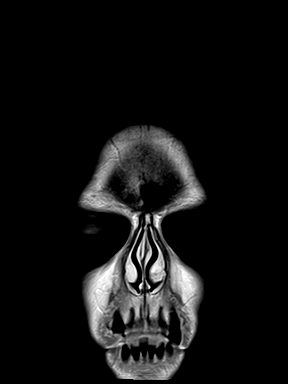

[36 of 48 positions shown; findings below may reference images not displayed]

FINDINGS: Brain: No restricted diffusion to suggest acute infarction. No
midline shift, mass effect, evidence of mass lesion,
ventriculomegaly, extra-axial collection or acute intracranial
hemorrhage. Cervicomedullary junction and pituitary are within
normal limits.

Cerebral volume appears stable and within normal limits for age.
Gray and white matter signal is within normal limits for age
throughout the brain. No encephalomalacia or chronic cerebral blood
products identified.

Dedicated thin slice imaging of the bilateral temporal lobes.
Hippocampal formations and other mesial temporal lobe structures
appear symmetric and within normal limits.

No abnormal enhancement identified. No dural thickening.

Vascular: Major intracranial vascular flow voids are stable and
within normal limits. The major dural venous sinuses are enhancing
and appear to be patent.

Skull and upper cervical spine: Negative.

Sinuses/Orbits: Orbits appear stable and normal. Chronic mucosal
thickening in the right sphenoid sinus is stable.

Other: Mastoids remain clear. Visible internal auditory structures
appear normal. Negative visible scalp and face.
IMPRESSION: MRI appearance of the brain is stable since last year and normal for
age.

## 2020-12-18 MED ORDER — GADOBUTROL 1 MMOL/ML IV SOLN
7.5000 mL | Freq: Once | INTRAVENOUS | Status: AC | PRN
  Administered 2020-12-18: 7.5 mL via INTRAVENOUS

## 2021-05-07 ENCOUNTER — Other Ambulatory Visit: Payer: Self-pay | Admitting: Family Medicine

## 2021-05-07 DIAGNOSIS — Z9889 Other specified postprocedural states: Secondary | ICD-10-CM

## 2021-07-23 ENCOUNTER — Other Ambulatory Visit: Payer: Self-pay

## 2021-07-23 ENCOUNTER — Ambulatory Visit
Admission: RE | Admit: 2021-07-23 | Discharge: 2021-07-23 | Disposition: A | Discharge status: Home / Self Care | Payer: Medicare Other | Source: Ambulatory Visit | Attending: Family Medicine | Admitting: Family Medicine

## 2021-07-23 DIAGNOSIS — Z9889 Other specified postprocedural states: Secondary | ICD-10-CM | POA: Insufficient documentation

## 2021-07-23 IMAGING — CT CT MAXILLOFACIAL W/O CM
3 series · 15 of 47 positions shown, 18 images · non-contrast
Comparison: None.

CLINICAL DATA: Preoperative planning for implants, left upper jaw
pain



[Series 2: max soft face 2.00 ax · axial · 0.31mm/px · z∈[-647,-507]mm · 9 of 83 slices shown, 12 images]
[im 6/83  brain]
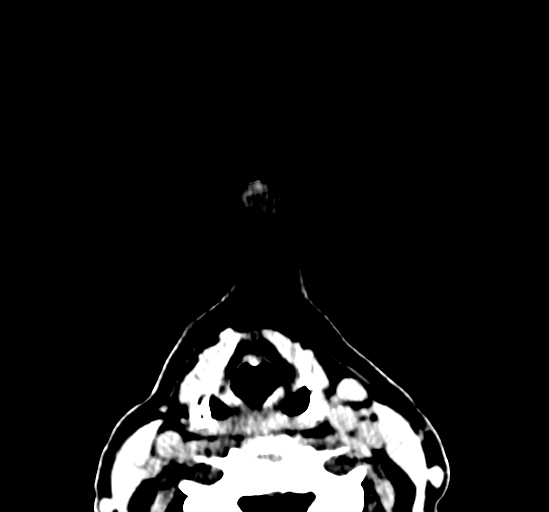
[im 6/83  bone]
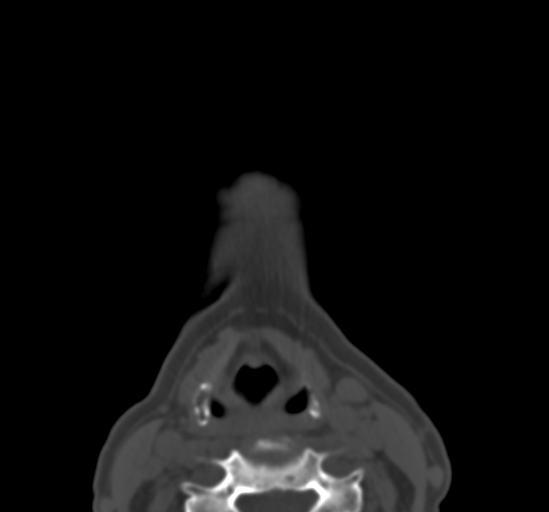
[im 15/83  bone]
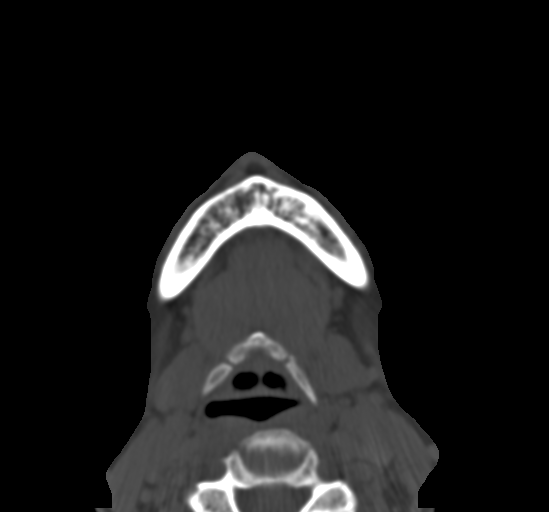
[im 23/83  bone]
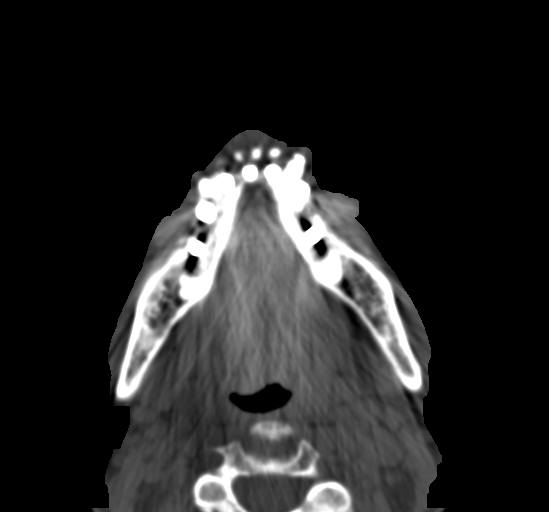
[im 32/83  bone]
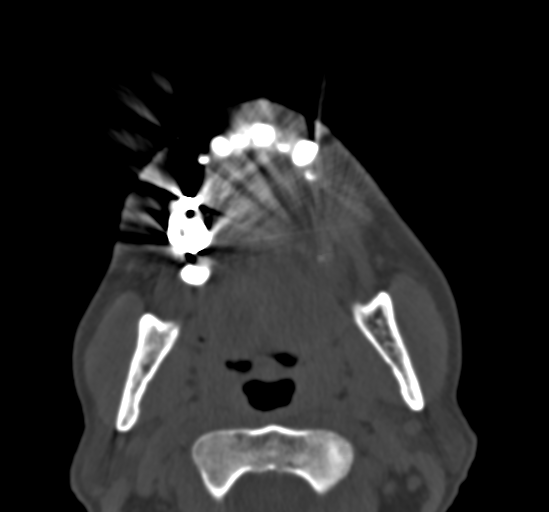
[im 43/83  brain]
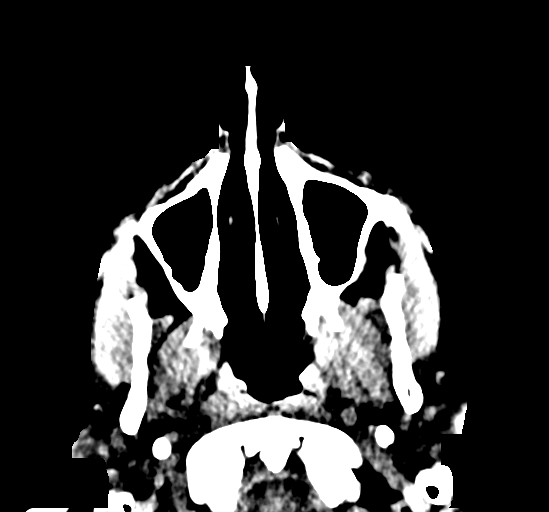
[im 43/83  bone]
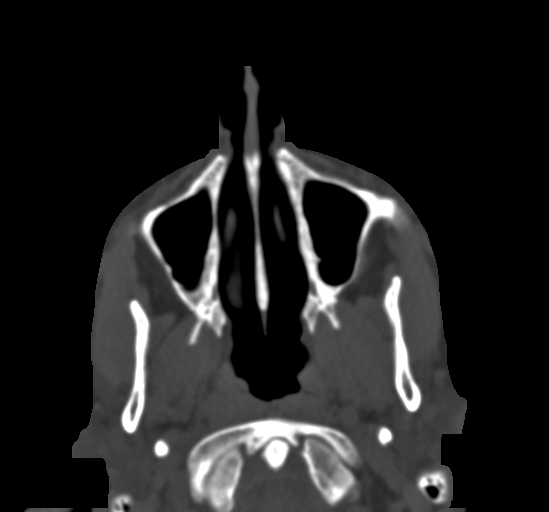
[im 51/83  bone]
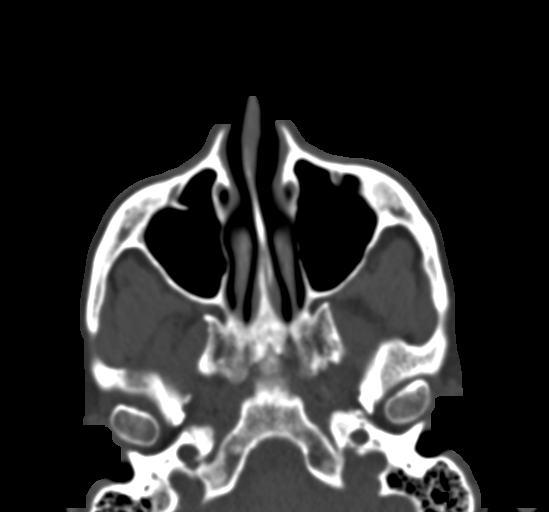
[im 60/83  bone]
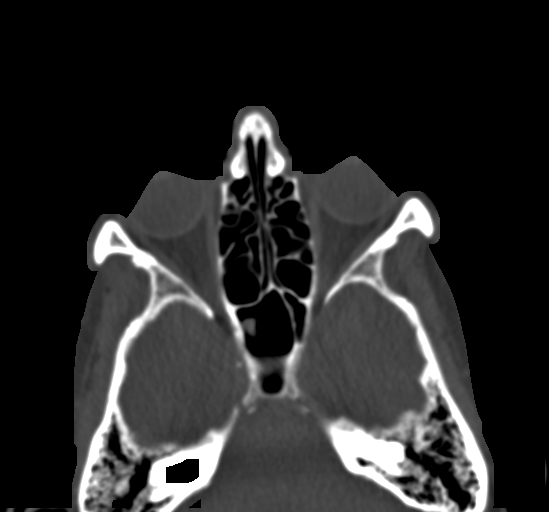
[im 68/83  bone]
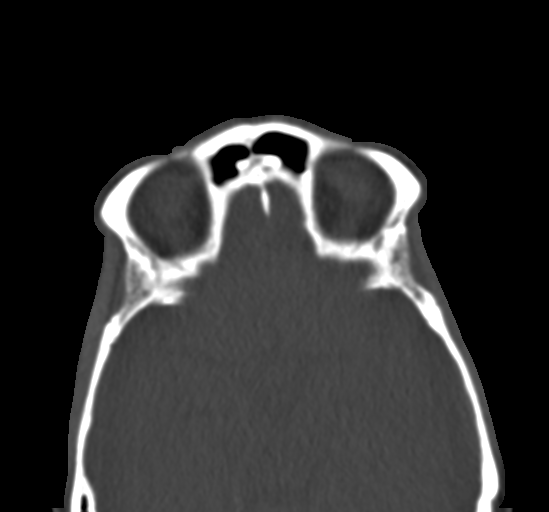
[im 77/83  brain]
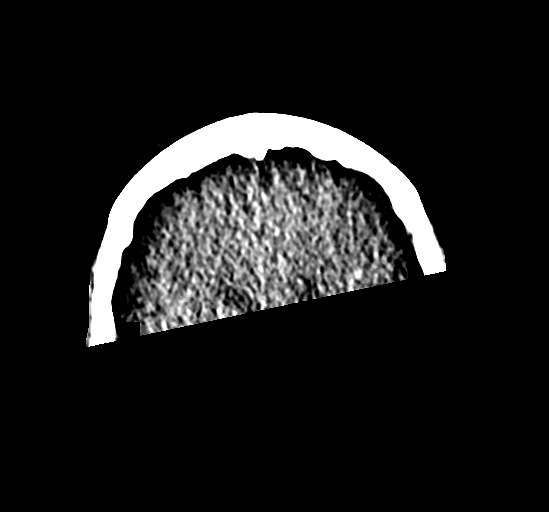
[im 77/83  bone]
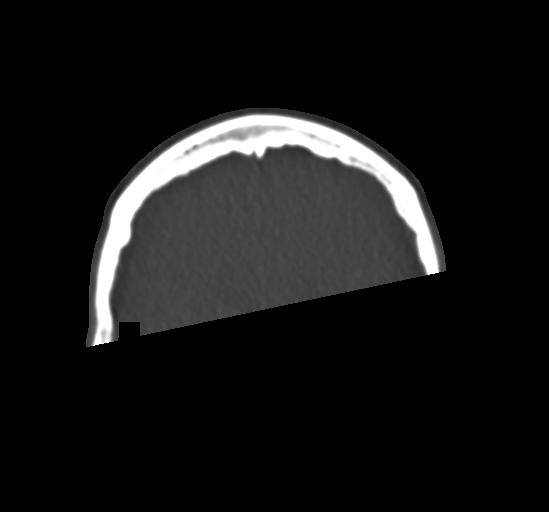

[Series 6: coronal soft face 2.00 cor · coronal · 0.33mm/px · 3 of 79 slices shown]
[im 27/79  bone]
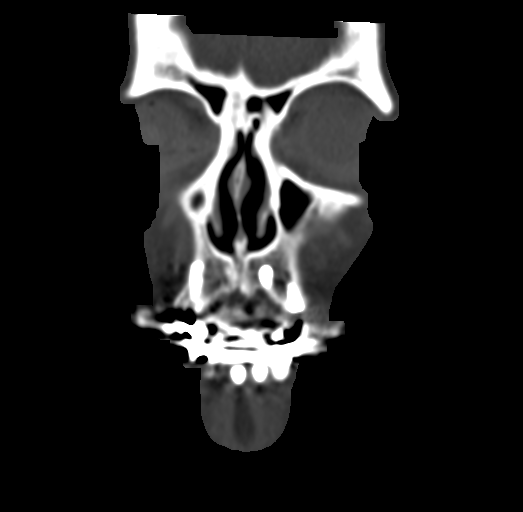
[im 35/79  bone]
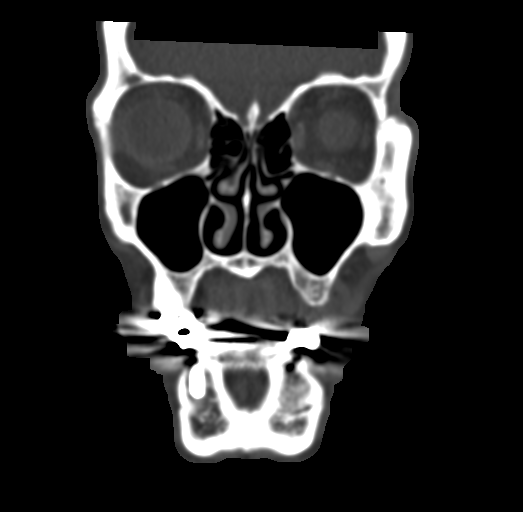
[im 44/79  bone]
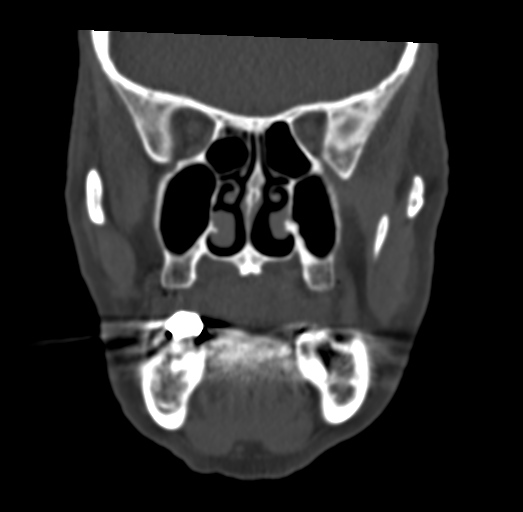

[Series 10: sagittal soft face 2.00 sag · sagittal · 0.31mm/px · 3 of 85 slices shown]
[im 29/85  bone]
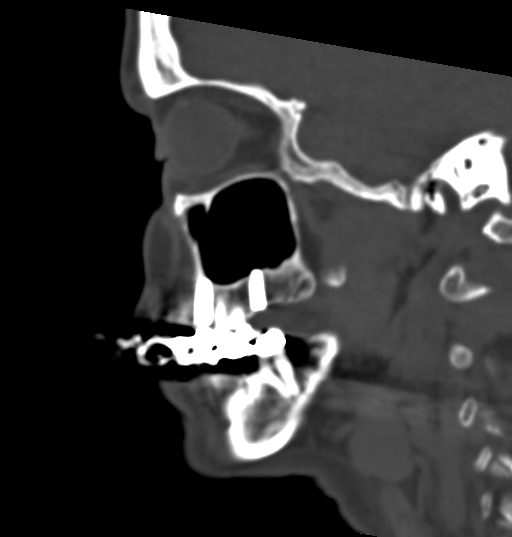
[im 43/85  bone]
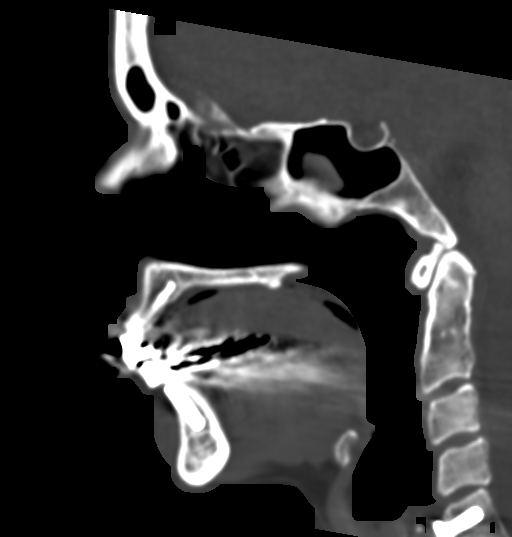
[im 57/85  bone]
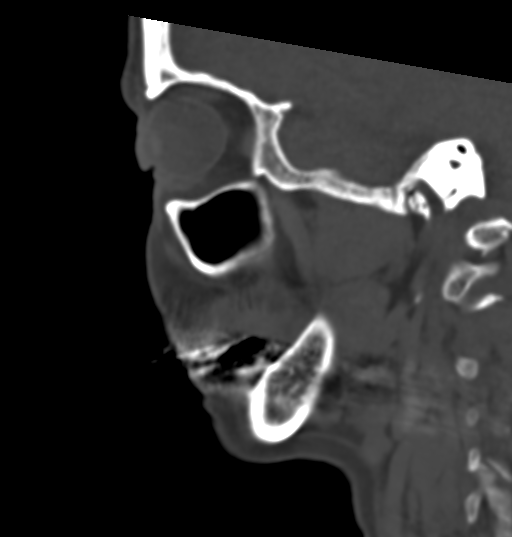

[15 of 47 positions shown; findings below may reference images not displayed]

FINDINGS: Osseous: No acute fracture or mandibular dislocation. Multiple
maxillary and mandibular implants are noted, with several of the
right maxillary implants appearing to protrude through the anterior
cortex of the maxilla (series 4, image 46). Possible mild lucency
about several of these, although evaluation is somewhat limited by
beam hardening artifact but particular lucency is suspected around
the most posterior left maxillary implant (series 8, image 39).

Orbits: Negative. No traumatic or inflammatory finding.

Sinuses: Right sphenoid mucous retention cyst.  Otherwise clear.

Soft tissues: Negative.

Limited intracranial: No significant or unexpected finding.
IMPRESSION: Multiple maxillary mandibular implants, with extension of several of
the right anterior maxillary implants through the maxillary cortex,
of indeterminate clinical significance. Evaluation is somewhat
limited by beam hardening artifact, but there is suggestion of
lucency about the most posterior left maxillary implant, which could
indicate loosening.

## 2021-07-29 ENCOUNTER — Other Ambulatory Visit (HOSPITAL_COMMUNITY): Payer: Self-pay | Admitting: Physician Assistant

## 2021-07-29 ENCOUNTER — Other Ambulatory Visit: Payer: Self-pay | Admitting: Physician Assistant

## 2021-07-29 DIAGNOSIS — R140 Abdominal distension (gaseous): Secondary | ICD-10-CM

## 2021-07-29 DIAGNOSIS — R1032 Left lower quadrant pain: Secondary | ICD-10-CM

## 2021-08-02 ENCOUNTER — Other Ambulatory Visit: Payer: Self-pay | Admitting: Family Medicine

## 2021-08-02 DIAGNOSIS — R2231 Localized swelling, mass and lump, right upper limb: Secondary | ICD-10-CM

## 2021-08-02 DIAGNOSIS — Z1231 Encounter for screening mammogram for malignant neoplasm of breast: Secondary | ICD-10-CM

## 2021-08-09 ENCOUNTER — Ambulatory Visit
Admission: RE | Admit: 2021-08-09 | Discharge: 2021-08-09 | Disposition: A | Discharge status: Home / Self Care | Payer: Medicare Other | Source: Ambulatory Visit | Attending: Physician Assistant | Admitting: Physician Assistant

## 2021-08-09 DIAGNOSIS — R140 Abdominal distension (gaseous): Secondary | ICD-10-CM | POA: Insufficient documentation

## 2021-08-09 DIAGNOSIS — R1032 Left lower quadrant pain: Secondary | ICD-10-CM | POA: Insufficient documentation

## 2021-08-09 LAB — POCT I-STAT CREATININE: Creatinine, Ser: 1.5 mg/dL — ABNORMAL HIGH (ref 0.44–1.00)

## 2021-08-09 IMAGING — CT CT ABD-PELV W/ CM
2 of 5 series · 15 of 46 positions shown, 17 images · IV contrast (agent unspecified)
Comparison: CT [DATE]

CLINICAL DATA: Generalized abdominal pain

EXAM:
CT ABDOMEN AND PELVIS WITH CONTRAST
TECHNIQUE: Multidetector CT imaging of the abdomen and pelvis was performed
using the standard protocol following bolus administration of
intravenous contrast.

[Series 2: abd pelvis 5.00 · axial · 0.65mm/px · z∈[-1539,-1114]mm · 12 of 95 slices shown, 14 images]
[im 5/95  soft-tissue]
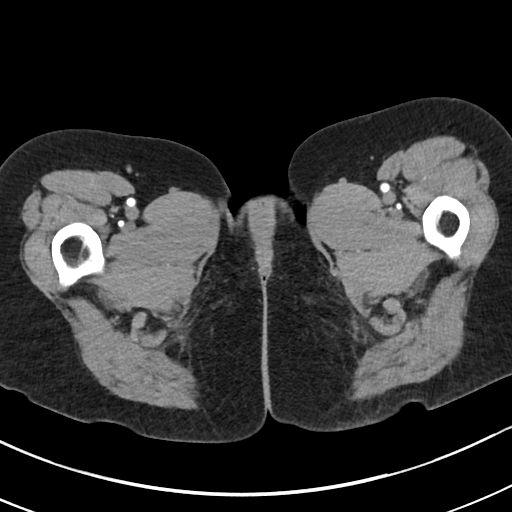
[im 5/95  bone]
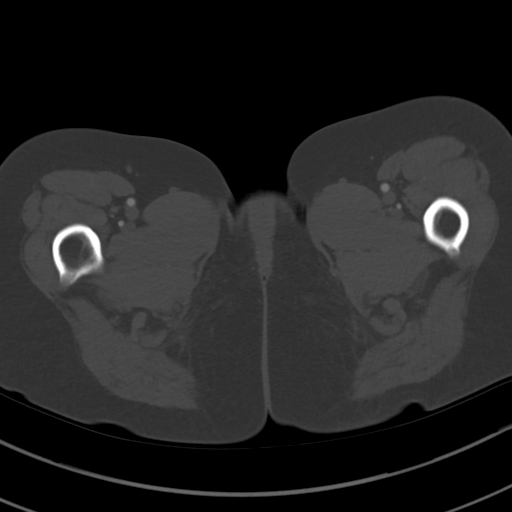
[im 15/95  soft-tissue]
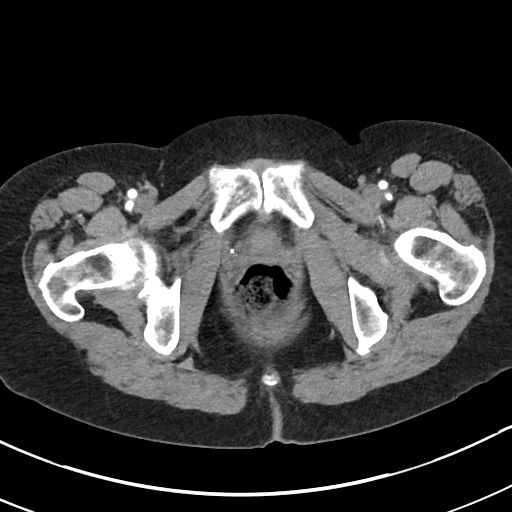
[im 20/95  soft-tissue]
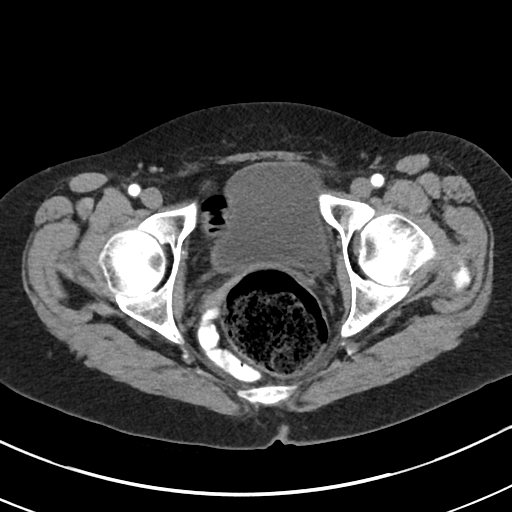
[im 30/95  soft-tissue]
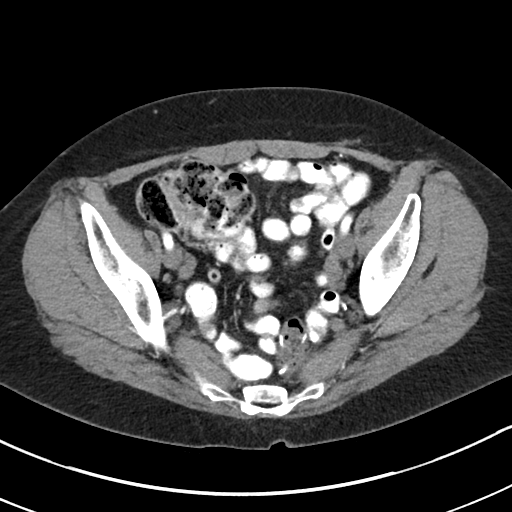
[im 35/95  soft-tissue]
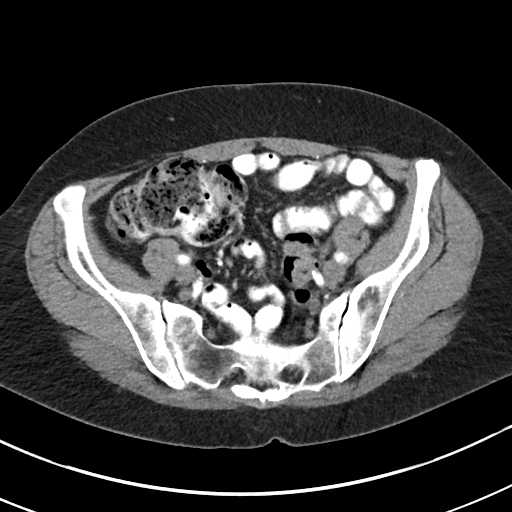
[im 45/95  soft-tissue]
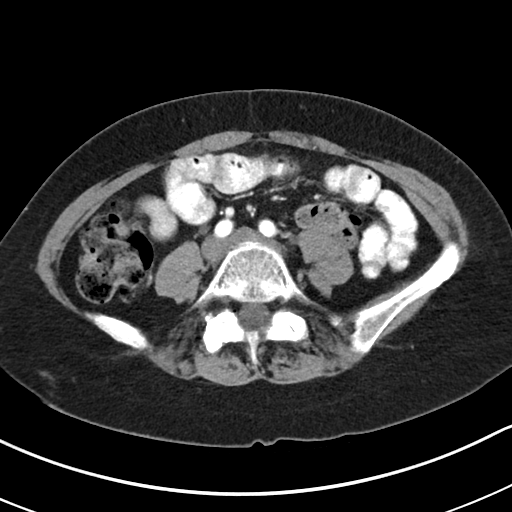
[im 50/95  soft-tissue]
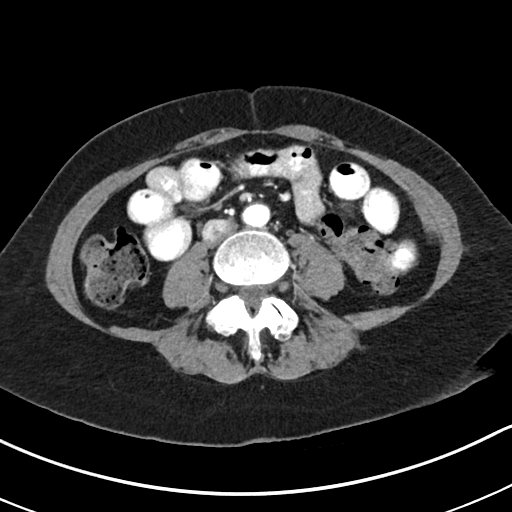
[im 60/95  soft-tissue]
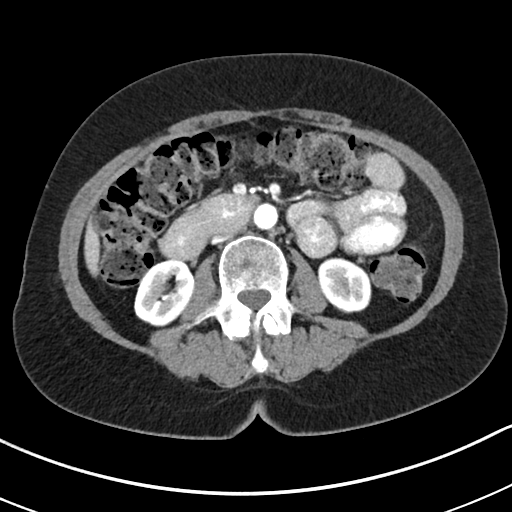
[im 65/95  soft-tissue]
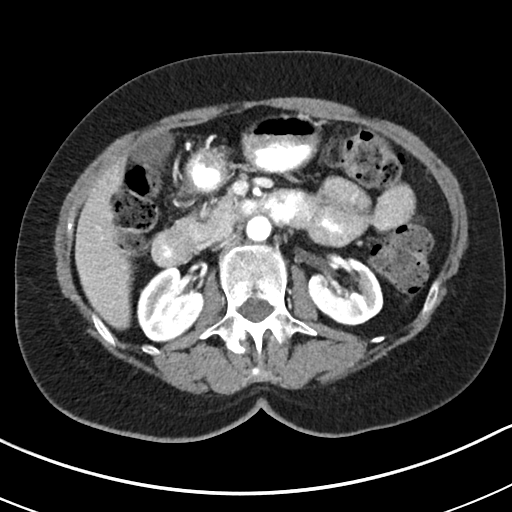
[im 65/95  bone]
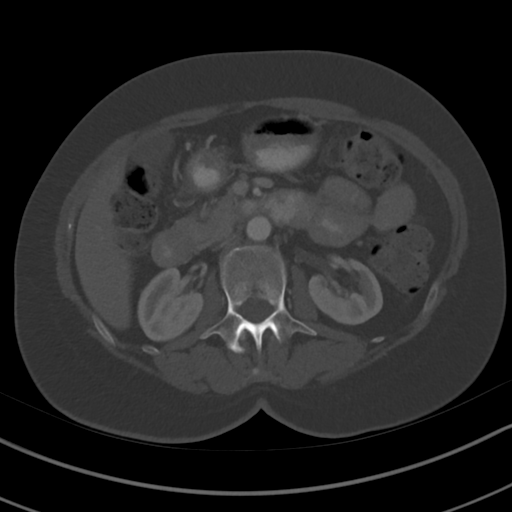
[im 75/95  soft-tissue]
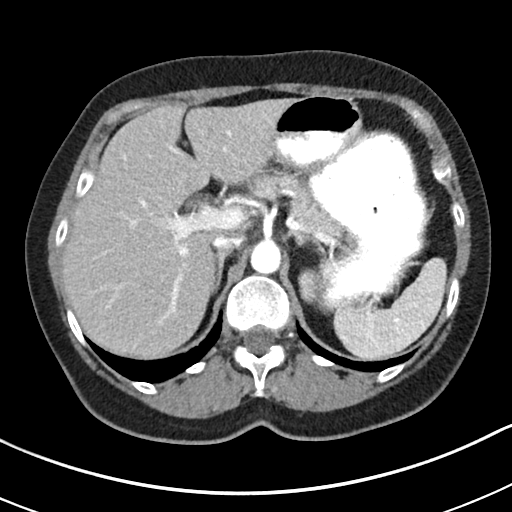
[im 80/95  soft-tissue]
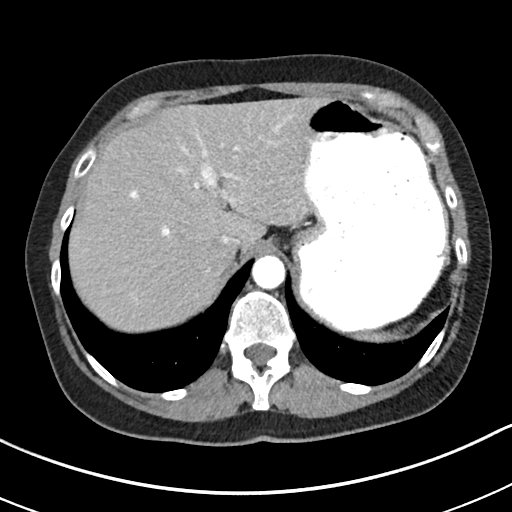
[im 90/95  soft-tissue]
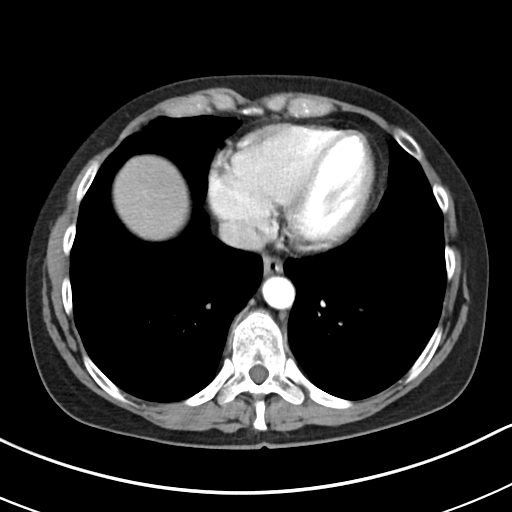

[Series 4: coronals abd pelvis 2.00 cor · coronal · 0.65mm/px · 3 of 131 slices shown]
[im 44/131  soft-tissue]
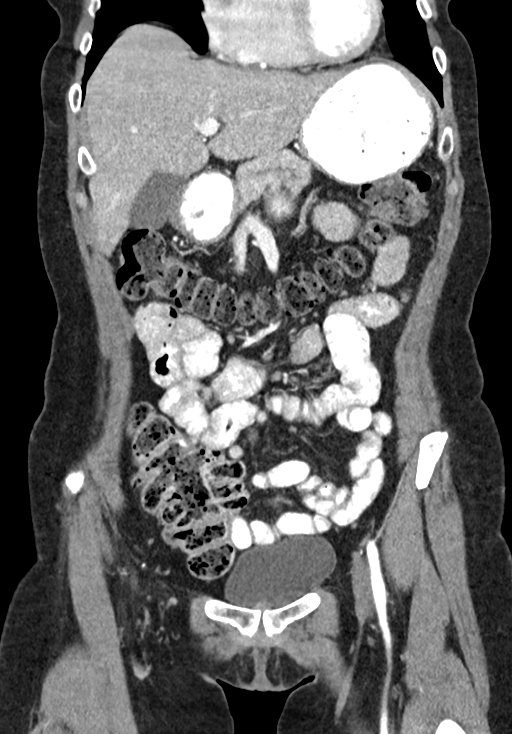
[im 58/131  soft-tissue]
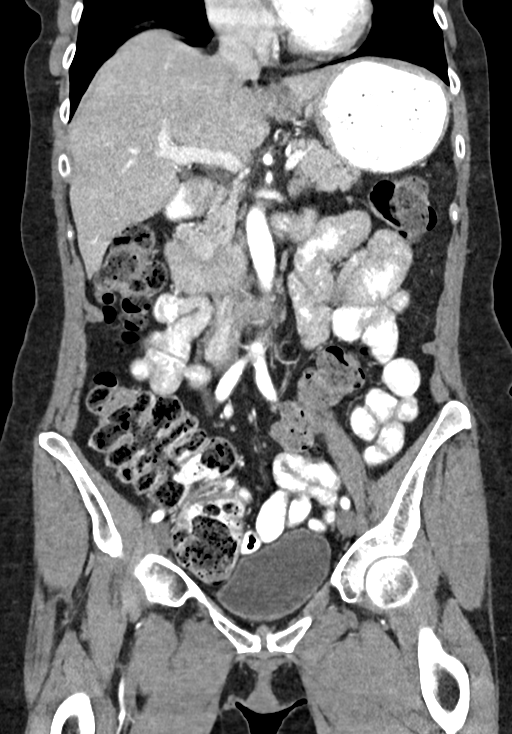
[im 73/131  soft-tissue]
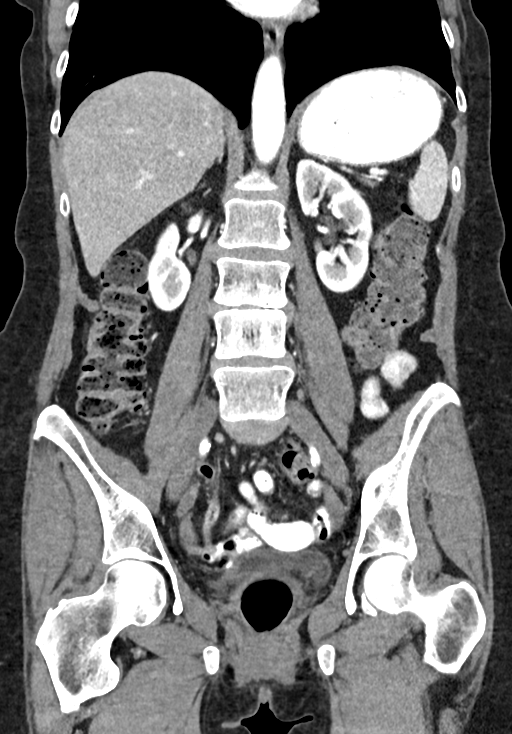

[15 of 46 positions shown; findings below may reference images not displayed]

RADIATION DOSE REDUCTION: This exam was performed according to the
departmental dose-optimization program which includes automated
exposure control, adjustment of the mA and/or kV according to
patient size and/or use of iterative reconstruction technique.

CONTRAST:  75mL OMNIPAQUE IOHEXOL 300 MG/ML  SOLN
FINDINGS: Lower chest: Lung bases demonstrate no acute consolidation or
effusion.

Hepatobiliary: No focal liver abnormality is seen. No gallstones,
gallbladder wall thickening, or biliary dilatation.

Pancreas: Unremarkable. No pancreatic ductal dilatation or
surrounding inflammatory changes.

Spleen: Normal in size without focal abnormality.

Adrenals/Urinary Tract: Adrenal glands are unremarkable. Kidneys are
normal, without renal calculi, focal lesion, or hydronephrosis.
Bladder is unremarkable.

Stomach/Bowel: The stomach is nonenlarged. Thickened appearance of
the third portion of duodenum, series 2, image 32 through 37.
Suspicion of jejunal bowel wall thickening as well in the left upper
quadrant. No evidence for an obstruction. Negative appendix. Large
stool burden. Postsurgical changes at the sigmoid colon

Vascular/Lymphatic: Nonaneurysmal aorta.  No suspicious lymph nodes.

Reproductive: Status post hysterectomy. No adnexal masses.

Other: Negative for pelvic effusion or free air

Musculoskeletal: No acute osseous abnormality
IMPRESSION: 1. No CT evidence for acute intra-abdominal or pelvic abnormality.
2. Suggestion of wall thickening involving the third portion of
duodenum and jejunal bowel loops in the left upper quadrant without
significant inflammation. Consider enteritis due to infection or
inflammatory bowel disease, versus bowel infiltrative process. There
is no evidence for obstruction

## 2021-08-09 MED ORDER — IOHEXOL 300 MG/ML  SOLN
85.0000 mL | Freq: Once | INTRAMUSCULAR | Status: AC | PRN
  Administered 2021-08-09: 75 mL via INTRAVENOUS

## 2021-08-17 ENCOUNTER — Other Ambulatory Visit: Payer: Self-pay | Admitting: Family Medicine

## 2021-08-17 DIAGNOSIS — Z1231 Encounter for screening mammogram for malignant neoplasm of breast: Secondary | ICD-10-CM

## 2021-08-17 DIAGNOSIS — R2231 Localized swelling, mass and lump, right upper limb: Secondary | ICD-10-CM

## 2021-08-18 ENCOUNTER — Other Ambulatory Visit: Payer: Self-pay | Admitting: Family Medicine

## 2021-08-18 DIAGNOSIS — R2231 Localized swelling, mass and lump, right upper limb: Secondary | ICD-10-CM

## 2021-08-19 ENCOUNTER — Ambulatory Visit
Admission: RE | Admit: 2021-08-19 | Discharge: 2021-08-19 | Disposition: A | Discharge status: Home / Self Care | Payer: Medicare Other | Source: Ambulatory Visit | Attending: Family Medicine | Admitting: Family Medicine

## 2021-08-19 DIAGNOSIS — Z87898 Personal history of other specified conditions: Secondary | ICD-10-CM | POA: Insufficient documentation

## 2021-08-19 DIAGNOSIS — R2231 Localized swelling, mass and lump, right upper limb: Secondary | ICD-10-CM | POA: Insufficient documentation

## 2021-08-19 DIAGNOSIS — D241 Benign neoplasm of right breast: Secondary | ICD-10-CM | POA: Insufficient documentation

## 2021-08-19 DIAGNOSIS — Z1231 Encounter for screening mammogram for malignant neoplasm of breast: Secondary | ICD-10-CM | POA: Diagnosis present

## 2021-08-19 IMAGING — US US BREAST*R* LIMITED INC AXILLA
1 series · 3 of 3 positions shown · non-contrast
Comparison: Previous exam(s).

CLINICAL DATA: RIGHT axillary palpable mass. History of E coli and
giardia.

EXAM:
DIGITAL DIAGNOSTIC BILATERAL MAMMOGRAM WITH TOMOSYNTHESIS AND CAD;
ULTRASOUND RIGHT BREAST LIMITED
TECHNIQUE: Bilateral digital diagnostic mammography and breast tomosynthesis
was performed. The images were evaluated with computer-aided
detection.; Targeted ultrasound examination of the right breast was
performed

[Series 1: us breast*right* limited inc axilla · 0.07mm/px · 3 of 3 slices shown]
[im 1/3]
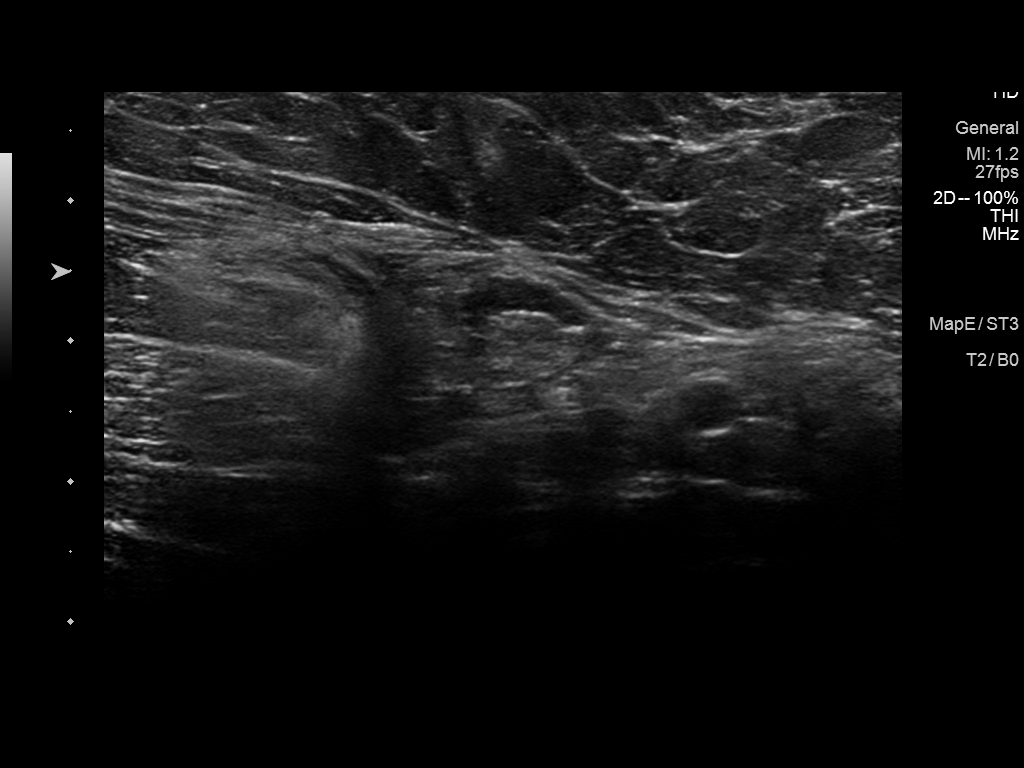
[im 2/3]
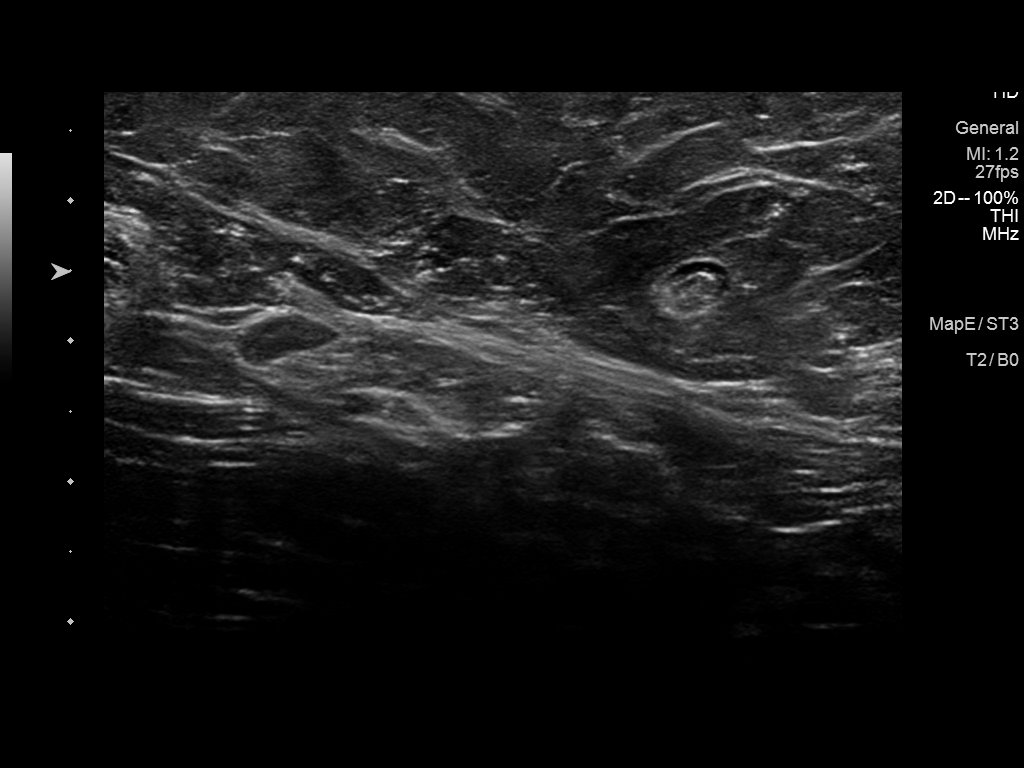
[im 3/3]
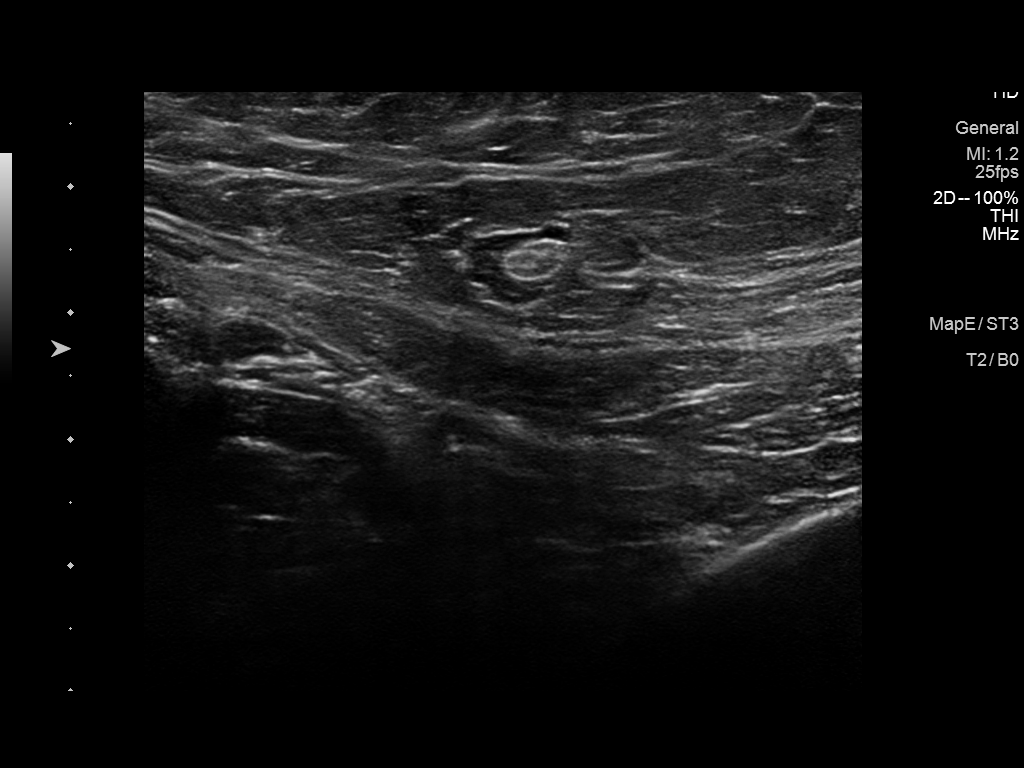

[3 of 3 positions shown; findings below may reference images not displayed]

ACR Breast Density Category c: The breast tissue is heterogeneously
dense, which may obscure small masses.
FINDINGS: Spot compression tomosynthesis views are obtained of the site of
palpable concern in the low RIGHT axilla. Subjacent to the site of
palpable concern is a mammographically stable benign lymph node. A
questioned asymmetry in the RIGHT breast resolved with additional
imaging, consistent with overlapping tissue. No suspicious mass,
distortion, or microcalcifications are identified to suggest
presence of malignancy bilaterally.

On physical exam, no suspicious mass is appreciated.

Targeted ultrasound was performed of the site of palpable concern in
the RIGHT axilla. There is a reniform mass with a thin smooth cortex
and normal echogenic hilum in the region of palpable concern. This
is consistent with a benign lymph node. No suspicious cystic or
solid mass is seen.
IMPRESSION: 1. No mammographic or sonographic evidence of malignancy at the site
of palpable concern. There is a benign lymph node in the region of
palpable concern. Any further workup of the patient's symptoms
should be based on the clinical assessment. Recommend routine annual
screening mammogram in 1 year.
2. No mammographic evidence of malignancy bilaterally.

RECOMMENDATION:
Screening mammogram in one year.(Code:[1S])

I have discussed the findings and recommendations with the patient.
If applicable, a reminder letter will be sent to the patient
regarding the next appointment.

BI-RADS CATEGORY  2: Benign.

## 2021-08-19 IMAGING — MG DIGITAL DIAGNOSTIC BILAT W/ TOMO W/ CAD
8 of 14 series · 8 of 40 positions shown · non-contrast
Comparison: Previous exam(s).

CLINICAL DATA: RIGHT axillary palpable mass. History of E coli and
giardia.

EXAM:
DIGITAL DIAGNOSTIC BILATERAL MAMMOGRAM WITH TOMOSYNTHESIS AND CAD;
ULTRASOUND RIGHT BREAST LIMITED
TECHNIQUE: Bilateral digital diagnostic mammography and breast tomosynthesis
was performed. The images were evaluated with computer-aided
detection.; Targeted ultrasound examination of the right breast was
performed

[R CC synth-2D (1 of 2)]
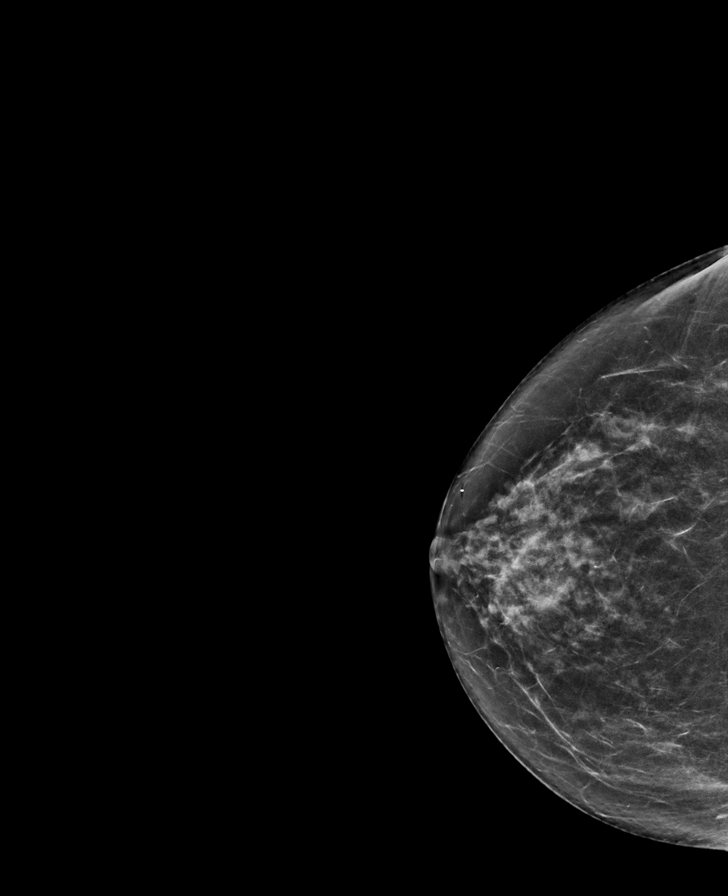

[R MLO synth-2D (1 of 2)]
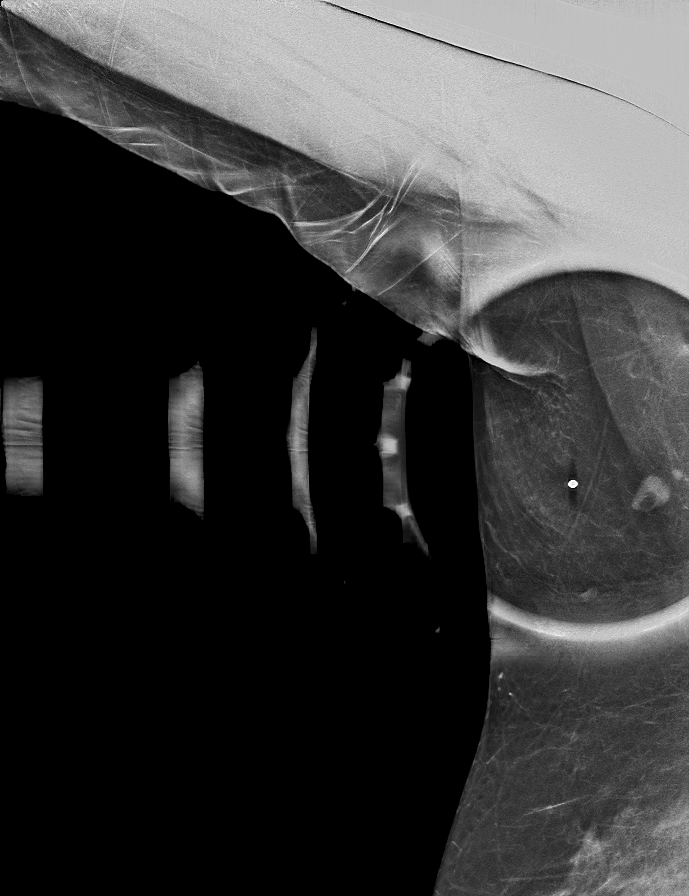

[R MLO synth-2D (2 of 2)]
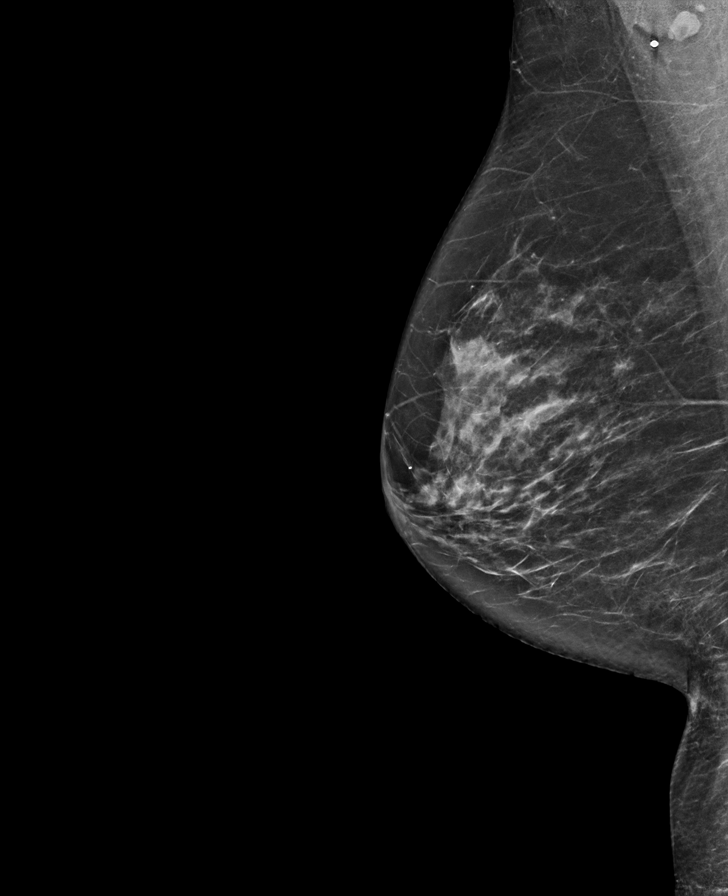

[L MLO synth-2D]
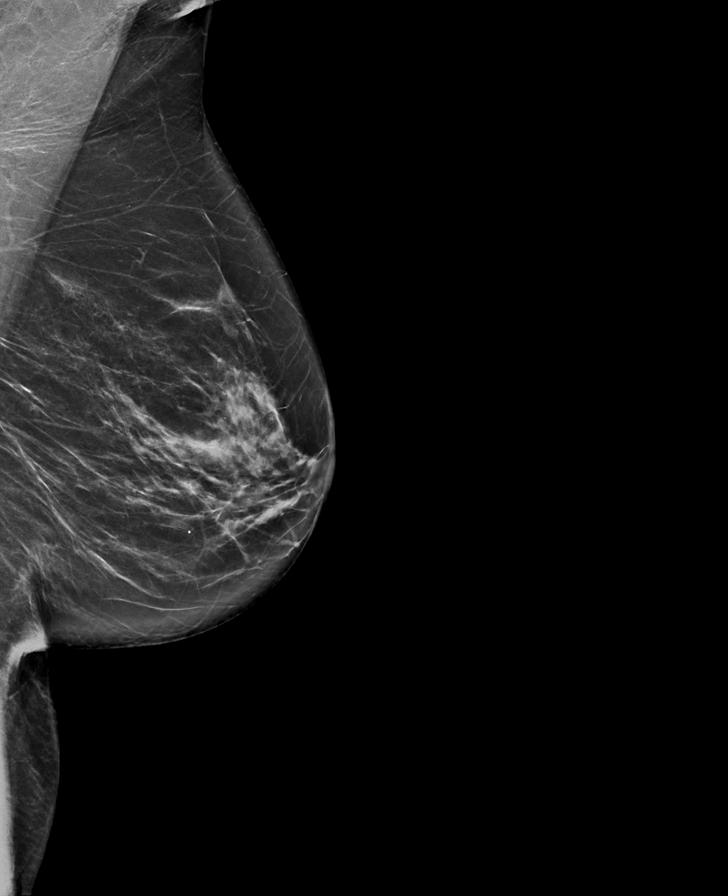

[R CC synth-2D (2 of 2)]
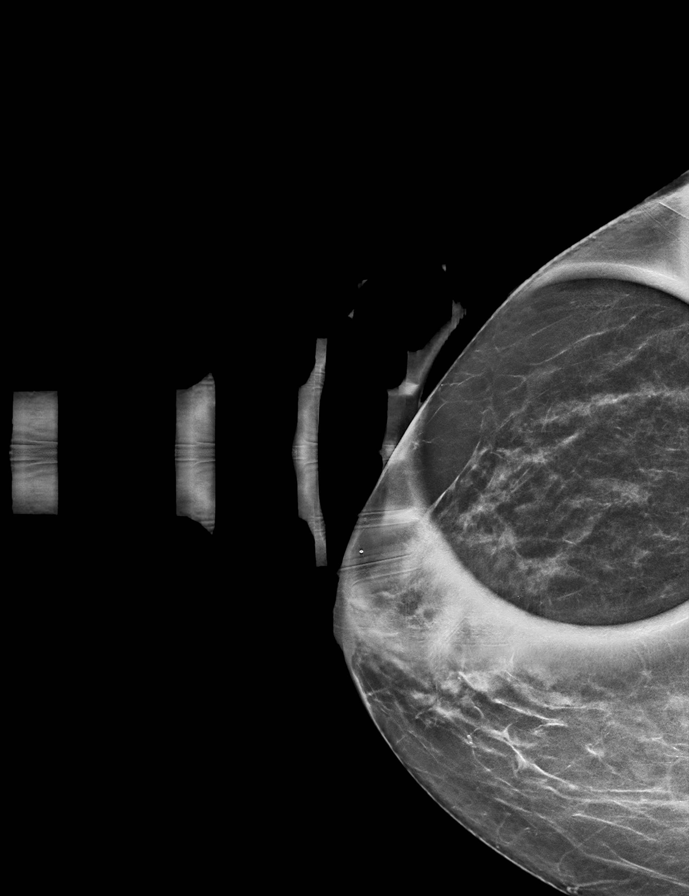

[R ML synth-2D]
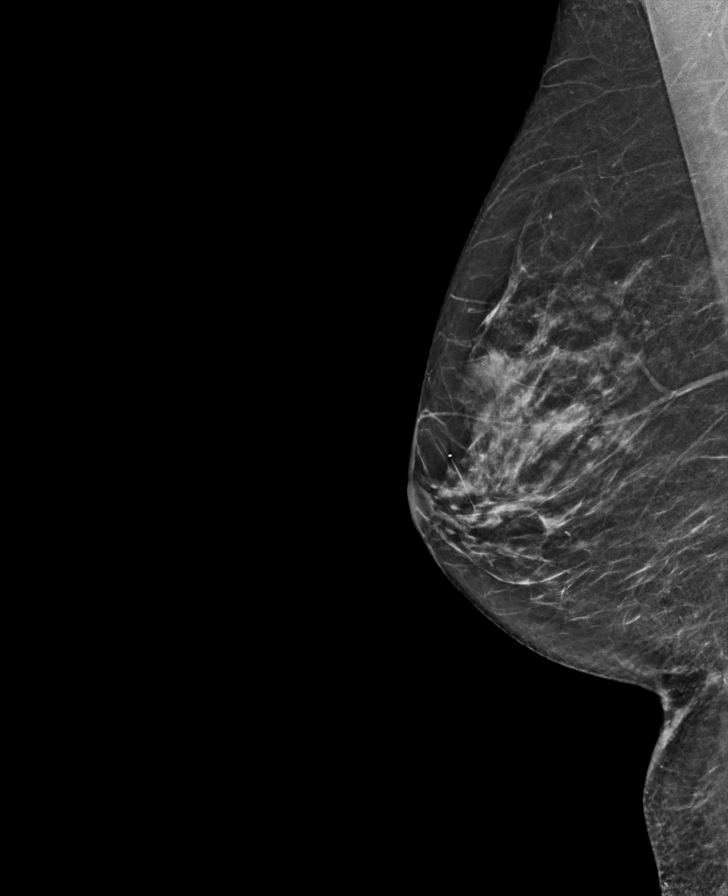

[L CC synth-2D]
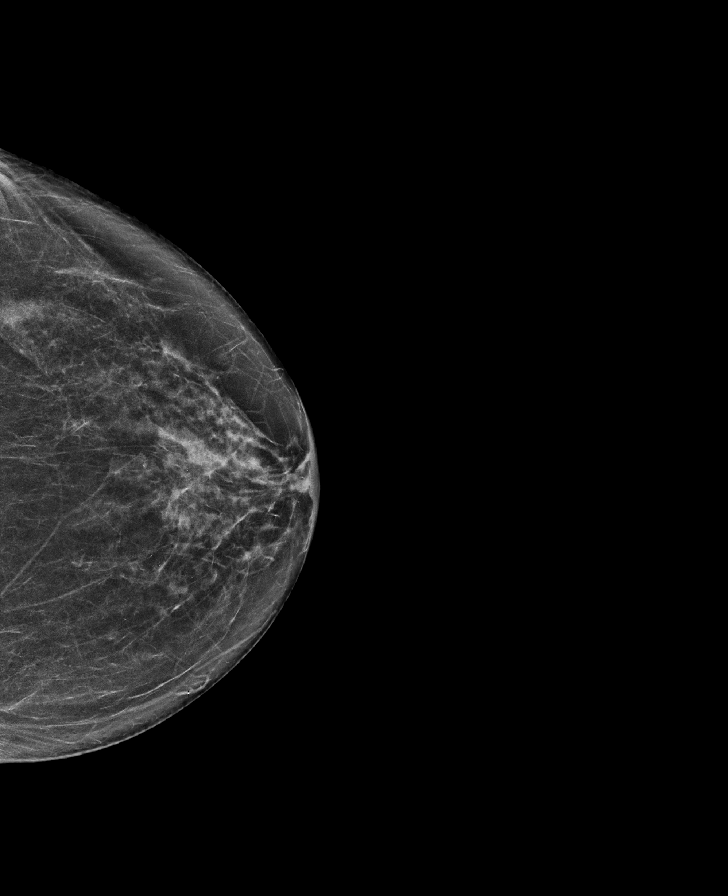

[R CC tomo · tomo slice 27/53.0]
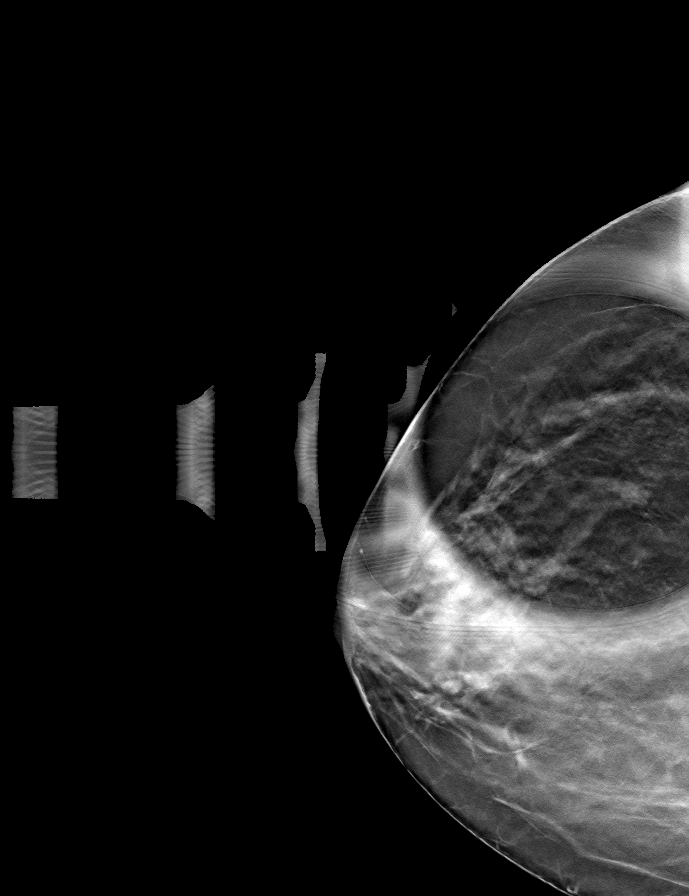

[8 of 40 positions shown; findings below may reference images not displayed]

ACR Breast Density Category c: The breast tissue is heterogeneously
dense, which may obscure small masses.
FINDINGS: Spot compression tomosynthesis views are obtained of the site of
palpable concern in the low RIGHT axilla. Subjacent to the site of
palpable concern is a mammographically stable benign lymph node. A
questioned asymmetry in the RIGHT breast resolved with additional
imaging, consistent with overlapping tissue. No suspicious mass,
distortion, or microcalcifications are identified to suggest
presence of malignancy bilaterally.

On physical exam, no suspicious mass is appreciated.

Targeted ultrasound was performed of the site of palpable concern in
the RIGHT axilla. There is a reniform mass with a thin smooth cortex
and normal echogenic hilum in the region of palpable concern. This
is consistent with a benign lymph node. No suspicious cystic or
solid mass is seen.
IMPRESSION: 1. No mammographic or sonographic evidence of malignancy at the site
of palpable concern. There is a benign lymph node in the region of
palpable concern. Any further workup of the patient's symptoms
should be based on the clinical assessment. Recommend routine annual
screening mammogram in 1 year.
2. No mammographic evidence of malignancy bilaterally.

RECOMMENDATION:
Screening mammogram in one year.(Code:[1S])

I have discussed the findings and recommendations with the patient.
If applicable, a reminder letter will be sent to the patient
regarding the next appointment.

BI-RADS CATEGORY  2: Benign.

## 2022-02-14 ENCOUNTER — Other Ambulatory Visit: Payer: Self-pay | Admitting: Neurology

## 2022-02-14 ENCOUNTER — Other Ambulatory Visit (HOSPITAL_COMMUNITY): Payer: Self-pay | Admitting: Neurology

## 2022-02-14 DIAGNOSIS — G3184 Mild cognitive impairment, so stated: Secondary | ICD-10-CM

## 2022-02-25 ENCOUNTER — Ambulatory Visit (HOSPITAL_COMMUNITY)
Admission: RE | Admit: 2022-02-25 | Discharge: 2022-02-25 | Disposition: A | Discharge status: Home / Self Care | Payer: Medicare Other | Source: Ambulatory Visit | Attending: Neurology | Admitting: Neurology

## 2022-02-25 DIAGNOSIS — G3184 Mild cognitive impairment, so stated: Secondary | ICD-10-CM

## 2022-06-08 ENCOUNTER — Ambulatory Visit: Payer: Medicare Other | Attending: Neurology | Admitting: Speech Pathology

## 2022-06-08 DIAGNOSIS — R41841 Cognitive communication deficit: Secondary | ICD-10-CM | POA: Insufficient documentation

## 2022-06-12 NOTE — Therapy (Signed)
OUTPATIENT SPEECH LANGUAGE PATHOLOGY  COGNITION EVALUATION   Patient Name: Frances Gallagher MRN: [id number] DOB:[birth year], 67 y.o., female Today's Date: 06/08/2022  PCP: Maryland Pink, MD REFERRING PROVIDER: Jennings Books, MD   End of Session - 06/08/22 1737     Visit Number 1    Number of Visits 17    Date for SLP Re-Evaluation 07/24/22    Authorization Type [employer] Medicare    Progress Note Due on Visit 10    SLP Start Time 1100    SLP Stop Time  1200    SLP Time Calculation (min) 60 min    Activity Tolerance Patient tolerated treatment well             No past medical history on file. Past Surgical History:  Procedure Laterality Date   ABDOMINAL HYSTERECTOMY     KNEE SURGERY     Patient Active Problem List   Diagnosis Date Noted   Diverticulitis of large intestine with abscess without bleeding    Anxiety, generalized 04/04/2016   Bilateral hip pain 04/04/2016   Sleep pattern disturbance 04/04/2016    ONSET DATE: date of referral 02/14/2022; seizure like activity noted 2021   REFERRING DIAG: R41.3 (ICD-10-CM) - Other amnesia   THERAPY DIAG:  Cognitive communication deficit  Rationale for Evaluation and Treatment Rehabilitation  SUBJECTIVE:   SUBJECTIVE STATEMENT: Pt pleasant, talkative, eager, appears to be good historian  Pt accompanied by: self  PERTINENT HISTORY:   Pt is a 67 year old female with medical history of major recurrent depression, sleep pattern disturbance, seizure like activity, anxiety, bilateral hip pain, diverticulitis.  Pt currently undergoing assessment for possible Alzheimer's Disease with neurology (Dr Maudry Diego Rayetta Humphrey) as well as recent neuropsychological testing (06/07/2022) for possible psuedodementia.    DIAGNOSTIC FINDINGS:   MRI - 02/25/2022  There is no evidence of an acute infarct, intracranial hemorrhage, mass, midline shift, or extra-axial fluid collection. No significant white matter disease  is seen for age. Cerebral volume is unchanged from the prior MRI. Volumetric analysis of the brain was performed, with a fully detailed report in [facility]. Briefly, the comparison with age and gender matched reference reveals whole brain volume to be at the 38th percentile.  ATN profile showed a low a-beta-amyloid 42/40 ratio and a high pTau181, both of which are concerning for the presence of an Alzheimer's pathology, though her APOE was E3/E3 which is not associated with an increased risk Alzheimer's pathology.  MRI 12/18/2020 MRI appearance of the brain is stable since last year and normal for age.  MRI 12/16/2019 Dedicated thin section imaging through the temporal lobes demonstrates normal volume and signal of the hippocampi. There is no evidence of heterotopia or cortical dysplasia. There is no evidence of an acute infarct, intracranial hemorrhage, mass, midline shift, or extra-axial fluid collection. The ventricles and sulci are normal. A few punctate foci of T2 hyperintensity in the cerebral white matter are within normal limits for age. No abnormal enhancement is identified.  PAIN:  Are you having pain? No   FALLS: Has patient fallen in last 6 months?  No  LIVING ENVIRONMENT: Lives with: lives with their spouse Lives in: House/apartment  PLOF:  Level of assistance: Independent with ADLs, Independent with IADLs Employment: Retired   PATIENT GOALS to increase her cognitive function  OBJECTIVE:   COGNITIVE COMMUNICATION Overall cognitive status: Within functional limits for tasks assessed  AUDITORY COMPREHENSION  Overall auditory comprehension: Appears intact YES/NO questions: Appears intact Following directions: Appears intact  Conversation: Complex Interfering components:  N/A Effective technique:  N/A  READING COMPREHENSION: Intact  EXPRESSION: verbal  VERBAL EXPRESSION:   Overall verbal expression: Appears intact Level of generative/spontaneous verbalization:  conversation Automatic speech: name: intact and social response: intact  Repetition: Appears intact Naming: Responsive: 76-100%, Confrontation: 76-100%, Convergent: 76-100%, and Divergent: 76-100% Pragmatics: Appears intact Comments: pt intermittently emotional Interfering components:  N/A Effective technique:  N/A Non-verbal means of communication: N/A  ORAL MOTOR EXAMINATION Facial : WFL Lingual: WFL Velum: WFL Mandible: WFL Cough: WFL Voice: WFL  MOTOR SPEECH: Overall motor speech: Appears intact Level of impairment:  N/A Respiration: diaphragmatic/abdominal breathing Phonation: normal Resonance: WFL Articulation: Appears intact Intelligibility: Intelligible Motor planning: Appears intact Motor speech errors:  N/A Interfering components:  N/A Effective technique:  N/A   STANDARDIZED ASSESSMENTS:   Cognitive Linguistic Quick Test: AGE - 18 - 69   The Cognitive Linguistic Quick Test (CLQT) was administered to assess the relative status of five cognitive domains: attention, memory, language, executive functioning, and visuospatial skills. Scores from 10 tasks were used to estimate severity ratings (standardized for age groups 18-69 years and 70-89 years) for each domain, a clock drawing task, as well as an overall composite severity rating of cognition.       Task Score Criterion Cut Scores  Personal Facts 8/8 8  Symbol Cancellation 11/12 11  Confrontation Naming 10/10 10  Clock Drawing  13/13 12  Story Retelling 9/10 6  Symbol Trails 10/10 9  Generative Naming 9/9 5  Design Memory 6/6 5  Mazes  5/8 7  Design Generation 2/13 6    Cognitive Domain Composite Score Severity Rating  Attention 181/215 WNL  Memory 179/185 WNL  Executive Function 26/40 WNL  Language 36/37 WNL  Visuospatial Skills 79/105 Mild  Clock Drawing  13/13 WNL  Composite Severity Rating  WNL   PATIENT REPORTED OUTCOME MEASURES (PROM): To be administered during next session   TODAY'S  TREATMENT:  N/A   PATIENT EDUCATION: Education details: results of this assessment, ST POC Person educated: Patient Education method: Explanation Education comprehension: needs further education     GOALS: Goals reviewed with patient? Yes  SHORT TERM GOALS: Target date: 10 sessions  Pt will complete higher level cognitive testing.  Baseline: Goal status: INITIAL  2.  Pt will complete PROMs related to pt's areas of concern.  Baseline:  Goal status: INITIAL   LONG TERM GOALS: Target date: 07/24/2022 Pt will use external memory aids with independence.  Baseline:  Goal status: INITIAL   ASSESSMENT:  CLINICAL IMPRESSION: Patient is a 67 y.o. female who was seen today for a formal cognitive evaluation. She currently presents with normal cognitive abilities based on score from the Cognitive Linguistic Quick Test. She would benefit from additional testing and as well PROMs fur full assessment of higher level cognitive function.   OBJECTIVE IMPAIRMENTS include  further testing required . These impairments are limiting patient from ADLs/IADLs. Factors affecting potential to achieve goals and functional outcome are co-morbidities, medical prognosis, severity of impairments, and family/community support.. Patient will benefit from skilled SLP services to address above impairments and improve overall function.  REHAB POTENTIAL: Good  PLAN: SLP FREQUENCY: 1-2x/week  SLP DURATION: 8 weeks  PLANNED INTERVENTIONS: SLP instruction and feedback, Compensatory strategies, and Patient/family education   Susanne Baumgarner B. Rutherford Nail, M.S., CCC-SLP, Mining engineer Certified Brain Injury [place]  [place] Office [phone] Ascom [phone] Fax [phone]

## 2022-06-15 ENCOUNTER — Ambulatory Visit: Payer: Medicare Other | Admitting: Speech Pathology

## 2022-06-15 DIAGNOSIS — R41841 Cognitive communication deficit: Secondary | ICD-10-CM | POA: Diagnosis not present

## 2022-06-17 ENCOUNTER — Ambulatory Visit: Payer: Medicare Other | Admitting: Speech Pathology

## 2022-06-17 NOTE — Therapy (Signed)
OUTPATIENT SPEECH LANGUAGE PATHOLOGY TREATMENT NOTE DISCHARGE SUMMARY   Patient Name: Frances Gallagher MRN: [id number] DOB:Oct 31, 1955, 67 y.o., female Today's Date: 06/17/2022  PCP: Maryland Pink, MD REFERRING PROVIDER: Jennings Books, MD  END OF SESSION:   End of Session - 06/17/22 0825     Visit Number 2    Number of Visits 17    Date for SLP Re-Evaluation 07/24/22    Authorization Type [employer] Medicare    Progress Note Due on Visit 10    SLP Start Time 0900    SLP Stop Time  1000    SLP Time Calculation (min) 60 min    Activity Tolerance Patient tolerated treatment well             No past medical history on file. Past Surgical History:  Procedure Laterality Date   ABDOMINAL HYSTERECTOMY     KNEE SURGERY     Patient Active Problem List   Diagnosis Date Noted   Diverticulitis of large intestine with abscess without bleeding    Anxiety, generalized 04/04/2016   Bilateral hip pain 04/04/2016   Sleep pattern disturbance 04/04/2016    ONSET DATE: date of referral 02/14/2022; seizure like activity noted 2021    REFERRING DIAG: R41.3 (ICD-10-CM) - Other amnesia    PERTINENT HISTORY:    Pt is a 67 year old female with medical history of major recurrent depression, sleep pattern disturbance, seizure like activity, anxiety, bilateral hip pain, diverticulitis.   Pt currently undergoing assessment for possible Alzheimer's Disease with neurology (Dr Maudry Diego Rayetta Humphrey) as well as recent neuropsychological testing (06/07/2022) for possible psuedodementia.   DIAGNOSTIC FINDINGS:    MRI - 02/25/2022  There is no evidence of an acute infarct, intracranial hemorrhage, mass, midline shift, or extra-axial fluid collection. No significant white matter disease is seen for age. Cerebral volume is unchanged from the prior MRI. Volumetric analysis of the brain was performed, with a fully detailed report in [facility]. Briefly, the comparison with age and gender  matched reference reveals whole brain volume to be at the 38th percentile.   ATN profile showed a low a-beta-amyloid 42/40 ratio and a high pTau181, both of which are concerning for the presence of an Alzheimer's pathology, though her APOE was E3/E3 which is not associated with an increased risk Alzheimer's pathology.   MRI 12/18/2020 MRI appearance of the brain is stable since last year and normal for age.   MRI 12/16/2019 Dedicated thin section imaging through the temporal lobes demonstrates normal volume and signal of the hippocampi. There is no evidence of heterotopia or cortical dysplasia. There is no evidence of an acute infarct, intracranial hemorrhage, mass, midline shift, or extra-axial fluid collection. The ventricles and sulci are normal. A few punctate foci of T2 hyperintensity in the cerebral white matter are within normal limits for age. No abnormal enhancement is identified.    THERAPY DIAG:  Cognitive communication deficit  Rationale for Evaluation and Treatment Rehabilitation  SUBJECTIVE: pt pleasant, very engaged  Pt accompanied by: self  PAIN:  Are you having pain? No  PATIENT GOALS: to increase her cognitive function   OBJECTIVE:   TODAY'S TREATMENT: Skilled treatment session focused on review of current assessment, performance within normal limits as well as having pt practice some of the deficits that she reports such as counting change and calendar management. Pt was accurate in this activities within the session. Furthermore, she and her husband have started creating strategies to help with calendar management as well money.  PATIENT EDUCATION: Education details: see above Person educated: Patient Education method: Consulting civil engineer, Demonstration, Verbal cues, and Handouts Education comprehension: verbalized understanding and returned demonstration  HOME EXERCISE PROGRAM:  Continue using compensatory strategies  GOALS: Goals reviewed with patient?  Yes   SHORT TERM GOALS: Target date: 10 sessions   Pt will complete higher level cognitive testing.  Baseline: Goal status: INITIAL   2.  Pt will complete PROMs related to pt's areas of concern.  Baseline:  Goal status: INITIAL     LONG TERM GOALS: Target date: 07/24/2022 Pt will use external memory aids with independence.  Baseline:  Goal status: INITIAL    ASSESSMENT:  CLINICAL IMPRESSION: While pt continues to report intermittent issues with counting money and some reversal of numbers, her overall ability to complete ADLs and IADLs remains functional with use of compensatory strategies. Hopeful that recent neuropsychological testing will help pt develop any further strategies that she might need.    Armistead Sult B. Rutherford Nail, M.S., CCC-SLP, Mining engineer Certified Brain Injury [place]  [place] Office [phone] Ascom [phone] Fax [phone]

## 2022-06-20 ENCOUNTER — Ambulatory Visit: Payer: Medicare Other | Admitting: Speech Pathology

## 2022-06-23 ENCOUNTER — Ambulatory Visit: Payer: Medicare Other | Admitting: Speech Pathology

## 2022-06-28 ENCOUNTER — Ambulatory Visit: Payer: Medicare Other | Admitting: Speech Pathology

## 2022-06-30 ENCOUNTER — Ambulatory Visit: Payer: Medicare Other | Admitting: Speech Pathology

## 2022-07-05 ENCOUNTER — Ambulatory Visit: Payer: Medicare Other | Admitting: Speech Pathology

## 2022-07-07 ENCOUNTER — Ambulatory Visit: Payer: Medicare Other | Admitting: Speech Pathology

## 2022-07-13 ENCOUNTER — Ambulatory Visit: Payer: Medicare Other | Admitting: Speech Pathology

## 2022-07-15 ENCOUNTER — Ambulatory Visit: Payer: Medicare Other | Admitting: Speech Pathology

## 2022-07-18 NOTE — Progress Notes (Unsigned)
Psychiatric Initial Adult Assessment   Patient Identification: Frances Gallagher MRN:  [id number] Date of Evaluation:  07/21/2022 Referral Source: Maryland Pink, MD  Chief Complaint:   Chief Complaint  Patient presents with   Establish Care   Visit Diagnosis:    ICD-10-CM   1. MDD (major depressive disorder), recurrent episode, mild ([place])  F33.0     2. Anxiety disorder, unspecified type  F41.9       History of Present Illness:   Frances Gallagher is a 67 y.o. year old female with a history of anxiety, seizure like activity, sleep disturbance, who is referred for cognitive issues, memory loss concerning for mild cognitive impairment vs early dementia   She was seen by neurology.  1. Cognitive issues, memory loss concerning for Mild Cognitive Impairment vs early dementia, progressive since 2014. MRI brain without contrast with NeuroQuant sequence 02/25/2022 - Briefly, the comparison with age and  gender matched reference reveals whole brain volume to be at the 38th percentile. Unchanged and unremarkable appearance of the brain. NeuroQuant volumetric analysis of the brain, see details on [employer].   Reviewed 04/01/2022 CBC, CMP, TSH, Vit B12, Vit B1, Vit D, folate, Treponema Pallidum (syphilis) screening cascade.  ATN profile ([facility] number [id number]) - this includes Beta-Amyloid 42/40 ratio, Phosphorylated Tau 181 - pTau181 - both assess level of pathological change associated with Alzheimer's Disease, serum Neurofilament Light chain protein assess disease severity by measuring neurodegeneration).  APO Alzheimer's Risk Maryan Puls [phone]) - E3/E3   She states that she was diagnosed with MCI last September.  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  She has been seen by neurologist, therapist and a cardiologist.  She also has external circumstances of losing 3 family members and 1 employers of her husband.  Her brother was found to have  severe heart failure.  Her sister has been hit by a car lately.  She thinks her anxiety has heightened.  She will so feels depressed.  She was very happy prior to being diagnosed with Alzheimer's disease.   Cognition-she got lost in [place] when she was driving, although she is in town since she was born. She needs to work hard to be on time. She has difficulty in cooking.  She has difficulty in remembering numbers. She was shocked by the result of her cognition assessment, given her high baseline intellectual level. She feels anxious how fast the disease is progressing.   Depression- The patient has mood symptoms as in PHQ-9/GAD-7.  A few months ago, she completely fell apart and desired to stay in bed. However, she thinks her mood has been more stable since uptitration of lamotrigine by neurologist, stating that she does not experience extremely low as before. She has insomnia, and sleeps five hours with Ambien. She feels sad. She is working on things to improve her cognition, such as walking, riding a horse. She enjoys photography , hiking , playing the piano , reading.  She has good appetite.   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, fast food in music Last PCP / ongoing medical evaluation:     Associated Signs/Symptoms: Depression Symptoms:  depressed mood, anhedonia, insomnia, fatigue, anxiety, (Hypo) Manic Symptoms:   denies decreased need for sleep, euphoria Anxiety Symptoms:   mild anxiety  Psychotic Symptoms:   Ah, Vh, paranoia (except when she had seizure) PTSD  Symptoms: Negative  Past Psychiatric History:  Outpatient: denies Psychiatry admission: denies Previous suicide attempt: denies Past trials of medication: denies History of violence:  History of head injury:   Previous Psychotropic Medications: No   Substance Abuse History in the last 12 months:  No.  Consequences of Substance Abuse: Negative  Past Medical History: History reviewed. No pertinent past medical history.  Past Surgical History:  Procedure Laterality Date   ABDOMINAL HYSTERECTOMY     KNEE SURGERY      Family Psychiatric History: as below  Family History:  Family History  Problem Relation Age of Onset   Lung cancer Mother    Breast cancer Maternal Aunt     Social History:   Social History   Socioeconomic History   Marital status: Married    Spouse name: Not on file   Number of children: Not on file   Years of education: Not on file   Highest education level: Master's degree (e.g., MA, MS, MEng, MEd, MSW, MBA)  Occupational History   Not on file  Tobacco Use   Smoking status: Never   Smokeless tobacco: Never  Substance and Sexual Activity   Alcohol use: No   Drug use: No   Sexual activity: Yes  Other Topics Concern   Not on file  Social History Narrative   Not on file   Social Determinants of Health   Financial Resource Strain: Not on file  Food Insecurity: Not on file  Transportation Needs: Not on file  Physical Activity: Not on file  Stress: Not on file  Social Connections: Not on file    Additional Social History: as below  Allergies:  No Known Allergies  Metabolic Disorder Labs: No results found for: "HGBA1C", "MPG" No results found for: "PROLACTIN" No results found for: "CHOL", "TRIG", "HDL", "CHOLHDL", "VLDL", "LDLCALC" No results found for: "TSH" TSH 01/2022 3.272   Therapeutic Level Labs: No results found for: "LITHIUM" No results found for: "CBMZ" No results found for: "VALPROATE"  Current Medications: Current  Outpatient Medications  Medication Sig Dispense Refill   ALPRAZolam (XANAX) 0.25 MG tablet Take 1 tablet by mouth as needed.     cyclobenzaprine (FLEXERIL) 10 MG tablet Take 1 tablet by mouth 3 (three) times daily.     etodolac (LODINE) 500 MG tablet Take 1 tablet by mouth daily.     lamoTRIgine (LAMICTAL) 200 MG tablet Take 200 mg by mouth 2 (two) times daily.     sertraline (ZOLOFT) 25 MG tablet Take 1 tablet (25 mg total) by mouth at bedtime. 30 tablet 1   vitamin B-12 (CYANOCOBALAMIN) 1000 MCG tablet Take 1 tablet by mouth 1 day or 1 dose.     zolpidem (AMBIEN) 10 MG tablet Take 5 mg by mouth at bedtime.     oxyCODONE (OXY IR/ROXICODONE) 5 MG immediate release tablet Take 1-2 tablets (5-10 mg total) by mouth every 4 (four) hours as needed for severe pain. (Patient not taking: Reported on 07/21/2022) 20 tablet 0   No current facility-administered medications for this visit.    Musculoskeletal: Strength & Muscle Tone: within normal limits Gait & Station: normal Patient leans: N/A  Psychiatric Specialty Exam: Review of Systems  Psychiatric/Behavioral:  Positive for decreased concentration, dysphoric mood and sleep disturbance. Negative for agitation, behavioral problems, confusion, hallucinations, self-injury and suicidal ideas. The patient is nervous/anxious. The patient is not hyperactive.   All other systems reviewed and are negative.   Blood pressure 127/83, pulse 60, temperature (!) 97 F (36.1 C), temperature source Skin, height 5' 4.5" (1.638 m), weight 141 lb 12.8 oz (64.3 kg).Body mass index is 23.96 kg/m.  General Appearance: Fairly Groomed  Eye Contact:  Good  Speech:  Clear and Coherent  Volume:  Normal  Mood:  Anxious and Depressed  Affect:  Appropriate, Congruent, and slightly tense  Thought Process:  Coherent  Orientation:  Full (Time, Place, and Person)  Thought Content:  Logical  Suicidal Thoughts:  No  Homicidal Thoughts:  No  Memory:  Immediate;   Good   Judgement:  Good  Insight:  Good  Psychomotor Activity:  Normal  Concentration:  Concentration: Good and Attention Span: Good  Recall:  Good  Fund of Knowledge:Good  Language: Good  Akathisia:  No  Handed:  Right  AIMS (if indicated):  not done  Assets:  Communication Skills Desire for Improvement  ADL's:  Intact  Cognition: WNL  Sleep:  Fair   Screenings: GAD-7    Flowsheet [place] Office Visit from 07/21/2022 in [place]  Total GAD-7 Score 14      PHQ2-9    [place] Office Visit from 07/21/2022 in [place]  PHQ-2 Total Score 3  PHQ-9 Total Score 11       Assessment and Plan:  Zolie Louque is a 67 y.o. year old female with a history of depression, anxiety, seizure like activity, sleep disturbance, who is referred for cognitive issues, memory loss concerning for mild cognitive impairment vs early dementia   1. MDD (major depressive disorder), recurrent episode, mild ([place]) 2. Anxiety disorder, unspecified type Acute stressors include: recent diagnosis of MCI secondary to Alzheimer, her brother with CHF, her sister being hit by a car Other stressors include:  lost her three family members,    History: history of depression including SI 1998, though no treatment in the past She reports depressive symptoms and anxiety in the context of stressors as above.  Will start sertraline to target depression and anxiety.  Discussed potential risk of GI side effect and drowsiness.  She will continue to be see a therapist.   # mild cognitive disorder She is under the care of neurologist. The message is sent to inquire whether she would be suitable for lecanemab, acknowledging its risks, particularly considering her seizure disorder.  Plan Start sertraline 25 mg at night  Next appointment: 5/13 at 8 am for 30 mins. IP Obtain record for neuropsychological testing - on Ambien 5 mg qhsprn,  prescribed by neurology - on lamotrigine 200 mg bid for seizure  The patient demonstrates the following risk factors for suicide: Chronic risk factors for suicide include: psychiatric disorder of anxiety . Acute risk factors for suicide include: unemployment and loss (financial, interpersonal, professional). Protective factors for this patient include: positive social support, coping skills, and hope for the future. Considering these factors, the overall suicide risk at this point appears to be low. Patient is appropriate for outpatient follow up.   Collaboration of Care: Other reviewed notes in Epic  Patient/Guardian was advised Release of Information must be obtained prior to any record release in order to collaborate their  care with an outside provider. Patient/Guardian was advised if they have not already done so to contact the registration department to sign all necessary forms in order for [place] to release information regarding their care.   Consent: Patient/Guardian gives verbal consent for treatment and assignment of benefits for services provided during this visit. Patient/Guardian expressed understanding and agreed to proceed.   Norman Clay, MD 3/21/20243:12 PM

## 2022-07-19 ENCOUNTER — Ambulatory Visit: Payer: Medicare Other | Admitting: Speech Pathology

## 2022-07-21 ENCOUNTER — Ambulatory Visit: Payer: Medicare Other | Admitting: Speech Pathology

## 2022-07-21 ENCOUNTER — Encounter: Payer: Self-pay | Admitting: Psychiatry

## 2022-07-21 ENCOUNTER — Ambulatory Visit (INDEPENDENT_AMBULATORY_CARE_PROVIDER_SITE_OTHER): Payer: Medicare Other | Admitting: Psychiatry

## 2022-07-21 VITALS — BP 127/83 | HR 60 | Temp 97.0°F | Ht 64.5 in | Wt 141.8 lb

## 2022-07-21 DIAGNOSIS — F330 Major depressive disorder, recurrent, mild: Secondary | ICD-10-CM | POA: Diagnosis not present

## 2022-07-21 DIAGNOSIS — F419 Anxiety disorder, unspecified: Secondary | ICD-10-CM

## 2022-07-21 MED ORDER — SERTRALINE HCL 25 MG PO TABS: 25.0000 mg | ORAL_TABLET | Freq: Every day | ORAL | 1 refills | Status: DC

## 2022-07-21 NOTE — Patient Instructions (Signed)
Start sertraline 25 mg at night  Next appointment: 5/13 at 8 am

## 2022-07-25 ENCOUNTER — Ambulatory Visit: Payer: Medicare Other | Admitting: Speech Pathology

## 2022-08-02 ENCOUNTER — Ambulatory Visit: Payer: Medicare Other | Admitting: Speech Pathology

## 2022-08-04 ENCOUNTER — Ambulatory Visit: Payer: Medicare Other | Admitting: Speech Pathology

## 2022-08-09 ENCOUNTER — Ambulatory Visit: Payer: Medicare Other | Admitting: Speech Pathology

## 2022-08-11 ENCOUNTER — Ambulatory Visit: Payer: Medicare Other | Admitting: Speech Pathology

## 2022-08-15 ENCOUNTER — Telehealth: Payer: Self-pay | Admitting: Psychiatry

## 2022-08-15 NOTE — Telephone Encounter (Signed)
Reviewed the report from [facility] Neuropsychology Service dated 06/22/2022. This document will be scanned into the patient's chart.  In summary, she was seen for a comprehensive neuropsychological evaluation due to concern of cognitive changes.  Her performance on testing was within expectations across most [place] of testing.  She performed below expectations on the visual line orientation task and showed areas of weakness on test of naming and processing speed.  -"Given Frances Gallagher's performance on testing and the modifiable factors that could be contributing to her cognitive concern, she does not currently meet criteria for neurocognitive disorder."

## 2022-08-16 ENCOUNTER — Ambulatory Visit: Payer: Medicare Other | Admitting: Speech Pathology

## 2022-08-18 ENCOUNTER — Ambulatory Visit: Payer: Medicare Other | Admitting: Speech Pathology

## 2022-08-23 ENCOUNTER — Ambulatory Visit: Payer: Medicare Other | Admitting: Speech Pathology

## 2022-08-25 ENCOUNTER — Ambulatory Visit: Payer: Medicare Other | Admitting: Speech Pathology

## 2022-08-30 ENCOUNTER — Ambulatory Visit: Payer: Medicare Other | Admitting: Speech Pathology

## 2022-09-01 ENCOUNTER — Ambulatory Visit: Payer: Medicare Other | Admitting: Speech Pathology

## 2022-09-05 ENCOUNTER — Other Ambulatory Visit: Payer: Self-pay | Admitting: Psychiatry

## 2022-09-10 NOTE — Progress Notes (Unsigned)
BH MD/PA/NP OP Progress Note  09/12/2022 8:37 AM Amilia Ringgold [facility]  MRN:  [id number]  Chief Complaint:  Chief Complaint  Patient presents with   Follow-up   HPI:  This is a follow-up appointment for depression, neurocognitive disorder.  She states that she has difficult time due to her missing appointment time.  Although she tried to help her brother, who flied from [place] to help with his medical appointment, she brought him to the other place.  She also had an episode of her trying to get refill despite she has plenty of medication at home.  She tearfully describes that she feels bothered by it.  Although her husband is aware of this declining, some others have not recognized as she has been functioning well.  She has been working with a therapist to focus on things which she is interested.  She has not been able to do horseback riding due to physical issues.  She states that she grew up with horses, and that this is part of her.  She agrees to try exploring any opportunity to be with horse without riding. The patient has mood symptoms as in PHQ-9/GAD-7. Although she reports decrease in PO intake with good appetite, she is not concerned about her recent weight loss.  Although she thinks about "death", it is a fleeting thought, and she thinks about her family, and denies any SI.  Although she has guns, stating that she grew up with guns for self-defense, she does not see herself using it for self-harm.  She agrees to contact emergency resources if any worsening in SI.  She denies alcohol use or drug use.  She thinks sertraline has been helping for sleep; she sleeps up to 7 hours, although she has never slept that long before. She is willing to try uptitration of medication at this time.    (Baseline 135 lbs per patient) Wt Readings from Last 3 Encounters:  09/12/22 139 lb 6.4 oz (63.2 kg)  07/21/22 141 lb 12.8 oz (64.3 kg)  05/25/17 175 lb 3.2 oz (79.5 kg)     Visit Diagnosis:    ICD-10-CM    1. MDD (major depressive disorder), recurrent episode, mild (HCC)  F33.0     2. Anxiety disorder, unspecified type  F41.9       Past Psychiatric History: Please see initial evaluation for full details. I have reviewed the history. No updates at this time.     Past Medical History: History reviewed. No pertinent past medical history.  Past Surgical History:  Procedure Laterality Date   ABDOMINAL HYSTERECTOMY     KNEE SURGERY      Family Psychiatric History: Please see initial evaluation for full details. I have reviewed the history. No updates at this time.     Family History:  Family History  Problem Relation Age of Onset   Lung cancer Mother    Breast cancer Maternal Aunt     Social History:  Social History   Socioeconomic History   Marital status: Married    Spouse name: Not on file   Number of children: Not on file   Years of education: Not on file   Highest education level: Master's degree (e.g., MA, MS, MEng, MEd, MSW, MBA)  Occupational History   Not on file  Tobacco Use   Smoking status: Never   Smokeless tobacco: Never  Substance and Sexual Activity   Alcohol use: No   Drug use: No   Sexual activity: Yes  Other Topics Concern  Not on file  Social History Narrative   Not on file   Social Determinants of Health   Financial Resource Strain: Not on file  Food Insecurity: Not on file  Transportation Needs: Not on file  Physical Activity: Not on file  Stress: Not on file  Social Connections: Not on file    Allergies: No Known Allergies  Metabolic Disorder Labs: No results found for: "HGBA1C", "MPG" No results found for: "PROLACTIN" No results found for: "CHOL", "TRIG", "HDL", "CHOLHDL", "VLDL", "LDLCALC" No results found for: "TSH"  Therapeutic Level Labs: No results found for: "LITHIUM" No results found for: "VALPROATE" No results found for: "CBMZ"  Current Medications: Current Outpatient Medications  Medication Sig Dispense Refill    ALPRAZolam (XANAX) 0.25 MG tablet Take 1 tablet by mouth as needed.     cyclobenzaprine (FLEXERIL) 10 MG tablet Take 1 tablet by mouth 3 (three) times daily.     etodolac (LODINE) 500 MG tablet Take 1 tablet by mouth daily.     lamoTRIgine (LAMICTAL) 200 MG tablet Take 200 mg by mouth 2 (two) times daily.     Na Sulfate-K Sulfate-Mg Sulf 17.5-3.13-1.6 GM/177ML SOLN SMARTSIG:Bottle(s) By Mouth As Directed     oxyCODONE (OXY IR/ROXICODONE) 5 MG immediate release tablet Take 1-2 tablets (5-10 mg total) by mouth every 4 (four) hours as needed for severe pain. 20 tablet 0   sertraline (ZOLOFT) 25 MG tablet Take 1 tablet (25 mg total) by mouth at bedtime. 30 tablet 1   vitamin B-12 (CYANOCOBALAMIN) 1000 MCG tablet Take 1 tablet by mouth 1 day or 1 dose.     zolpidem (AMBIEN) 10 MG tablet Take 5 mg by mouth at bedtime.     No current facility-administered medications for this visit.     Musculoskeletal: Strength & Muscle Tone: within normal limits Gait & Station: normal Patient leans: N/A  Psychiatric Specialty Exam: Review of Systems  Psychiatric/Behavioral:  Positive for decreased concentration, dysphoric mood and sleep disturbance. Negative for agitation, behavioral problems, confusion, hallucinations, self-injury and suicidal ideas. The patient is not nervous/anxious and is not hyperactive.   All other systems reviewed and are negative.   Blood pressure 117/72, pulse (!) 55, temperature (!) 97.4 F (36.3 C), temperature source Skin, height 5' 4.5" (1.638 m), weight 139 lb 6.4 oz (63.2 kg).Body mass index is 23.56 kg/m.  General Appearance: Fairly Groomed  Eye Contact:  Good  Speech:  Clear and Coherent  Volume:  Normal  Mood:  Depressed  Affect:  Appropriate, Congruent, and Tearful  Thought Process:  Coherent  Orientation:  Full (Time, Place, and Person)  Thought Content: Logical   Suicidal Thoughts:  No  Homicidal Thoughts:  No  Memory:  Immediate;   Good  Judgement:  Good   Insight:  Good  Psychomotor Activity:  Normal  Concentration:  Concentration: Good and Attention Span: Good  Recall:  Good  Fund of Knowledge: Good  Language: Good  Akathisia:  No  Handed:  Right  AIMS (if indicated): not done  Assets:  Communication Skills Desire for Improvement  ADL's:  Intact  Cognition: WNL  Sleep:  Fair   Screenings: GAD-7    Garment/textile technologist Visit from [facility]/21/2022 in [facility]  Total GAD-7 Score 14      PHQ2-9    Flowsheet Row Office Visit from 09/12/2022 in [facility] Office Visit from 07/21/2022 in [facility]  PHQ-2 Total Score 5 3  PHQ-9 Total Score 17 11        Assessment and Plan:  Kammie Gearheart is a 67 y.o. year old female with a history of depression, anxiety, seizure like activity, sleep disturbance, who presents for follow up appointment for below.   1. MDD (major depressive disorder), recurrent episode, mild (HCC) 2. 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  We uptitrate the dose optimize treatment for depression and anxiety.  She will continue to see a therapist.    # mild cognitive disorder "04/01/2022 CBC, CMP, TSH, Vit B12, Vit B1, Vit D, folate, Treponema Pallidum (syphilis) screening cascade.  ATN profile ([facility] number [id number]) - this includes Beta-Amyloid 42/40 ratio, Phosphorylated Tau 181 - pTau181 - both assess level of pathological change associated with Alzheimer's Disease, serum Neurofilament Light chain protein assess disease  severity by measuring neurodegeneration).  APO Alzheimer's Risk - [facility] [phone]) - E3/E3"  Unchanged. She is under the care of neurologist. The message was sent to inquire whether she would be suitable for lecanemab, acknowledging its risks, particularly considering her seizure disorder.   Plan Increase sertraline 50 mg at night  Next appointment:  6/25 at 8:30 for 30 mins, video Obtain record for neuropsychological testing - on Ambien 5 mg qhsprn, prescribed by neurology - on lamotrigine 200 mg bid for seizure   The patient demonstrates the following risk factors for suicide: Chronic risk factors for suicide include: psychiatric disorder of anxiety . Acute risk factors for suicide include: unemployment and loss (financial, interpersonal, professional). Protective factors for this patient include: positive social support, coping skills, and hope for the future. Considering these factors, the overall suicide risk at this point appears to be low. Patient is appropriate for outpatient follow up.   Collaboration of Care: Collaboration of Care: Other reviewed notes in Epic  Patient/Guardian was advised Release of Information must be obtained prior to any record release in order to collaborate their care with an outside provider. Patient/Guardian was advised if they have not already done so to contact the registration department to sign all necessary forms in order for [place] to release information regarding their care.   Consent: Patient/Guardian gives verbal consent for treatment and assignment of benefits for services provided during this visit. Patient/Guardian expressed understanding and agreed to proceed.    [name], MD 09/12/2022, 8:37 AM

## 2022-09-12 ENCOUNTER — Encounter: Payer: Self-pay | Admitting: Psychiatry

## 2022-09-12 ENCOUNTER — Ambulatory Visit: Payer: Medicare Other | Admitting: Psychiatry

## 2022-09-12 VITALS — BP 117/72 | HR 55 | Temp 97.4°F | Ht 64.5 in | Wt 139.4 lb

## 2022-09-12 DIAGNOSIS — F330 Major depressive disorder, recurrent, mild: Secondary | ICD-10-CM | POA: Diagnosis not present

## 2022-09-12 DIAGNOSIS — F419 Anxiety disorder, unspecified: Secondary | ICD-10-CM

## 2022-09-12 MED ORDER — SERTRALINE HCL 50 MG PO TABS: 50.0000 mg | ORAL_TABLET | Freq: Every day | ORAL | 1 refills | Status: DC

## 2022-09-12 NOTE — Patient Instructions (Signed)
Increase sertraline 50 mg at night  Next appointment:  6/25 at 8:30, video
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
## 2022-10-25 ENCOUNTER — Encounter: Payer: Self-pay | Admitting: Psychiatry

## 2022-10-25 ENCOUNTER — Telehealth (INDEPENDENT_AMBULATORY_CARE_PROVIDER_SITE_OTHER): Payer: Medicare Other | Admitting: Psychiatry

## 2022-10-25 DIAGNOSIS — F419 Anxiety disorder, unspecified: Secondary | ICD-10-CM | POA: Diagnosis not present

## 2022-10-25 DIAGNOSIS — F330 Major depressive disorder, recurrent, mild: Secondary | ICD-10-CM

## 2022-10-25 MED ORDER — SERTRALINE HCL 50 MG PO TABS: 75.0000 mg | ORAL_TABLET | Freq: Every day | ORAL | 1 refills | Status: DC

## 2022-10-25 NOTE — Patient Instructions (Signed)
Increase sertraline 75 mg at night  Next appointment: 8./16 at 10 am, video

## 2022-11-07 ENCOUNTER — Encounter: Payer: Self-pay | Admitting: Ophthalmology

## 2022-11-09 ENCOUNTER — Encounter: Payer: Self-pay | Admitting: Ophthalmology

## 2022-11-09 NOTE — Anesthesia Preprocedure Evaluation (Addendum)
Anesthesia Evaluation  Patient identified by MRN, date of birth, ID band Patient awake    Reviewed: Allergy & Precautions, H&P , NPO status , Patient's Chart, lab work & pertinent test results  History of Anesthesia Complications (+) PONV and history of anesthetic complications  Airway Mallampati: IV  TM Distance: <3 FB Neck ROM: Full  Mouth opening: Limited Mouth Opening  Dental  (+) Implants Upper teeth are mostly implants, definitely the central and lateral incisors:   Pulmonary neg pulmonary ROS, sleep apnea  06-06-22  Home Sleep Testing showing moderate, non-positional Obstructive Sleep Apnea (AHI - 12, RDI - 21) with raised concern for cardiac dysrhythmia.  Recommend AutoPAP at 5 - 20 cm H20. Consult to cardiology for further consideration of abnormal cardiac rhythm  We will send a referral for PAP therapy initiation    Pulmonary exam normal breath sounds clear to auscultation       Cardiovascular negative cardio ROS Normal cardiovascular exam Rhythm:Regular Rate:Normal  06-06-22 Home Sleep Testing showing moderate, non-positional Obstructive Sleep Apnea (AHI - 12, RDI - 21) with raised concern for cardiac dysrhythmia.  Recommend AutoPAP at 5 - 20 cm H20. Consult to cardiology for further consideration of abnormal cardiac rhythm  Cannot find echo. Unspecified arrhythmias     Neuro/Psych  PSYCHIATRIC DISORDERS Anxiety Depression    negative neurological ROS  negative psych ROS   GI/Hepatic negative GI ROS, Neg liver ROS,,,  Endo/Other  negative endocrine ROS    Renal/GU negative Renal ROS  negative genitourinary   Musculoskeletal negative musculoskeletal ROS (+)    Abdominal   Peds negative pediatric ROS (+)  Hematology negative hematology ROS (+)   Anesthesia Other Findings PONV (postoperative nausea and vomiting) Depression Anxiety  Mild cognitive impairment Cardiac arrhythmias, unspecified type--cannot  find echo Sleep apnea  Reproductive/Obstetrics negative OB ROS                             Anesthesia Physical Anesthesia Plan  ASA: 3  Anesthesia Plan: MAC   Post-op Pain Management:    Induction: Intravenous  PONV Risk Score and Plan:   Airway Management Planned: Natural Airway and Nasal Cannula  Additional Equipment:   Intra-op Plan:   Post-operative Plan:   Informed Consent: I have reviewed the patients History and Physical, chart, labs and discussed the procedure including the risks, benefits and alternatives for the proposed anesthesia with the patient or authorized representative who has indicated his/her understanding and acceptance.     Dental Advisory Given  Plan Discussed with: Anesthesiologist, CRNA and Surgeon  Anesthesia Plan Comments: (Patient consented for risks of anesthesia including but not limited to:  - adverse reactions to medications - damage to eyes, teeth, lips or other oral mucosa - nerve damage due to positioning  - sore throat or hoarseness - Damage to heart, brain, nerves, lungs, other parts of body or loss of life  Patient voiced understanding.)        Anesthesia Quick Evaluation

## 2022-11-10 NOTE — Discharge Instructions (Signed)

## 2022-11-14 ENCOUNTER — Ambulatory Visit
Admission: RE | Admit: 2022-11-14 | Discharge: 2022-11-14 | Disposition: A | Discharge status: Home / Self Care | Payer: Medicare Other | Attending: Ophthalmology | Admitting: Ophthalmology

## 2022-11-14 ENCOUNTER — Ambulatory Visit: Payer: Medicare Other | Admitting: Anesthesiology

## 2022-11-14 ENCOUNTER — Encounter
Admission: RE | Disposition: A | Discharge status: Home / Self Care | Payer: Self-pay | Source: Home / Self Care | Attending: Ophthalmology

## 2022-11-14 ENCOUNTER — Other Ambulatory Visit: Payer: Self-pay

## 2022-11-14 ENCOUNTER — Encounter: Payer: Self-pay | Admitting: Ophthalmology

## 2022-11-14 DIAGNOSIS — G4733 Obstructive sleep apnea (adult) (pediatric): Secondary | ICD-10-CM | POA: Insufficient documentation

## 2022-11-14 DIAGNOSIS — H2512 Age-related nuclear cataract, left eye: Secondary | ICD-10-CM | POA: Diagnosis present

## 2022-11-14 DIAGNOSIS — F32A Depression, unspecified: Secondary | ICD-10-CM | POA: Diagnosis not present

## 2022-11-14 DIAGNOSIS — G3184 Mild cognitive impairment, so stated: Secondary | ICD-10-CM | POA: Diagnosis not present

## 2022-11-14 DIAGNOSIS — F419 Anxiety disorder, unspecified: Secondary | ICD-10-CM | POA: Diagnosis not present

## 2022-11-14 HISTORY — PX: CATARACT EXTRACTION W/PHACO: SHX586

## 2022-11-14 HISTORY — DX: Mild cognitive impairment of uncertain or unknown etiology: G31.84

## 2022-11-14 HISTORY — DX: Anxiety disorder, unspecified: F41.9

## 2022-11-14 HISTORY — DX: Personal history of other diseases of the circulatory system: Z86.79

## 2022-11-14 HISTORY — DX: Other specified postprocedural states: Z98.890

## 2022-11-14 HISTORY — DX: Depression, unspecified: F32.A

## 2022-11-14 SURGERY — PHACOEMULSIFICATION, CATARACT, WITH IOL INSERTION
Anesthesia: Monitor Anesthesia Care | Laterality: Left

## 2022-11-14 MED ORDER — SIGHTPATH DOSE#1 BSS IO SOLN
INTRAOCULAR | Status: DC | PRN
  Administered 2022-11-14: 15 mL via INTRAOCULAR

## 2022-11-14 MED ORDER — BRIMONIDINE TARTRATE-TIMOLOL 0.2-0.5 % OP SOLN
OPHTHALMIC | Status: DC | PRN
  Administered 2022-11-14: 1 [drp] via OPHTHALMIC

## 2022-11-14 MED ORDER — GLYCOPYRROLATE 0.2 MG/ML IJ SOLN
INTRAMUSCULAR | Status: DC | PRN
  Administered 2022-11-14: .2 mg via INTRAVENOUS

## 2022-11-14 MED ORDER — SIGHTPATH DOSE#1 BSS IO SOLN
INTRAOCULAR | Status: DC | PRN
  Administered 2022-11-14: 63 mL via OPHTHALMIC

## 2022-11-14 MED ORDER — MIDAZOLAM HCL 2 MG/2ML IJ SOLN
INTRAMUSCULAR | Status: DC | PRN
  Administered 2022-11-14: 2 mg via INTRAVENOUS

## 2022-11-14 MED ORDER — ONDANSETRON HCL 4 MG/2ML IJ SOLN
INTRAMUSCULAR | Status: DC | PRN
  Administered 2022-11-14: 4 mg via INTRAVENOUS

## 2022-11-14 MED ORDER — TETRACAINE HCL 0.5 % OP SOLN
1.0000 [drp] | OPHTHALMIC | Status: DC | PRN
  Administered 2022-11-14 (×3): 1 [drp] via OPHTHALMIC

## 2022-11-14 MED ORDER — SIGHTPATH DOSE#1 BSS IO SOLN
INTRAOCULAR | Status: DC | PRN
  Administered 2022-11-14: 1 mL

## 2022-11-14 MED ORDER — SIGHTPATH DOSE#1 NA HYALUR & NA CHOND-NA HYALUR IO KIT
PACK | INTRAOCULAR | Status: DC | PRN
  Administered 2022-11-14: 1 via OPHTHALMIC

## 2022-11-14 MED ORDER — CEFUROXIME OPHTHALMIC INJECTION 1 MG/0.1 ML
INJECTION | OPHTHALMIC | Status: DC | PRN
  Administered 2022-11-14: .1 mL via SUBCONJUNCTIVAL

## 2022-11-14 MED ORDER — FENTANYL CITRATE (PF) 100 MCG/2ML IJ SOLN
INTRAMUSCULAR | Status: DC | PRN
  Administered 2022-11-14: 25 ug via INTRAVENOUS

## 2022-11-14 MED ORDER — ARMC OPHTHALMIC DILATING DROPS
1.0000 | OPHTHALMIC | Status: DC | PRN
Start: 2022-11-14 — End: 2022-11-14
  Administered 2022-11-14 (×3): 1 via OPHTHALMIC

## 2022-11-14 MED ORDER — MOXIFLOXACIN HCL 0.5 % OP SOLN
OPHTHALMIC | Status: DC | PRN
  Administered 2022-11-14: .2 mL via OPHTHALMIC

## 2022-11-14 MED ORDER — LACTATED RINGERS IV SOLN: INTRAVENOUS | Status: DC

## 2022-11-14 SURGICAL SUPPLY — 10 items
CATARACT SUITE SIGHTPATH (MISCELLANEOUS) ×1 IMPLANT
FEE CATARACT SUITE SIGHTPATH (MISCELLANEOUS) ×1 IMPLANT
GLOVE SRG 8 PF TXTR STRL LF DI (GLOVE) ×1 IMPLANT
GLOVE SURG ENC TEXT LTX SZ7.5 (GLOVE) ×1 IMPLANT
GLOVE SURG GAMMEX PI TX LF 7.5 (GLOVE) IMPLANT
GLOVE SURG UNDER POLY LF SZ8 (GLOVE) ×1
LENS IOL TECNIS EYHANCE 10.5 (Intraocular Lens) IMPLANT
NDL FILTER BLUNT 18X1 1/2 (NEEDLE) ×1 IMPLANT
NEEDLE FILTER BLUNT 18X1 1/2 (NEEDLE) ×1 IMPLANT
SYR 3ML LL SCALE MARK (SYRINGE) ×1 IMPLANT

## 2022-11-14 NOTE — Transfer of Care (Signed)
Immediate Anesthesia Transfer of Care Note  Patient: Frances Gallagher  Procedure(s) Performed: CATARACT EXTRACTION PHACO AND INTRAOCULAR LENS PLACEMENT (IOC) LEFT 4.75 00:36.7 (Left)  Patient Location: PACU  Anesthesia Type: MAC  Level of Consciousness: awake, alert  and patient cooperative  Airway and Oxygen Therapy: Patient Spontanous Breathing and Patient connected to supplemental oxygen  Post-op Assessment: Post-op Vital signs reviewed, Patient's Cardiovascular Status Stable, Respiratory Function Stable, Patent Airway and No signs of Nausea or vomiting  Post-op Vital Signs: Reviewed and stable  Complications: No notable events documented.

## 2022-11-14 NOTE — Op Note (Signed)
OPERATIVE NOTE  Frances Gallagher [facility] [id number] 11/14/2022   PREOPERATIVE DIAGNOSIS:  Nuclear sclerotic cataract left eye. H25.12   POSTOPERATIVE DIAGNOSIS:    Nuclear sclerotic cataract left eye.     PROCEDURE:  Phacoemusification with posterior chamber intraocular lens placement of the left eye  Ultrasound time: Procedure(s): CATARACT EXTRACTION PHACO AND INTRAOCULAR LENS PLACEMENT (IOC) LEFT 4.75 00:36.7 (Left)  LENS:   Implant Name Type Inv. Item Serial No. Manufacturer Lot No. LRB No. Used Action  LENS IOL TECNIS EYHANCE 10.5 - [id number] Intraocular Lens LENS IOL TECNIS EYHANCE 10.5 [id number] SIGHT[employer]  Left 1 Implanted      SURGEON:  Deirdre Evener, MD   ANESTHESIA:  Topical with tetracaine drops and 2% Xylocaine jelly, augmented with 1% preservative-free intracameral lidocaine.    COMPLICATIONS:  None.   DESCRIPTION OF PROCEDURE:  The patient was identified in the holding room and transported to the operating room and placed in the supine position under the operating microscope.  The left eye was identified as the operative eye and it was prepped and draped in the usual sterile ophthalmic fashion.   A 1 millimeter clear-corneal paracentesis was made at the 1:30 position.  0.5 ml of preservative-free 1% lidocaine was injected into the anterior chamber.  The anterior chamber was filled with Viscoat viscoelastic.  A 2.4 millimeter keratome was used to make a near-clear corneal incision at the 10:30 position.  .  A curvilinear capsulorrhexis was made with a cystotome and capsulorrhexis forceps.  Balanced salt solution was used to hydrodissect and hydrodelineate the nucleus.   Phacoemulsification was then used in stop and chop fashion to remove the lens nucleus and epinucleus.  The remaining cortex was then removed using the irrigation and aspiration handpiece. Provisc was then placed into the capsular bag to distend it for lens placement.  A lens was then injected into the  capsular bag.  The remaining viscoelastic was aspirated.   Wounds were hydrated with balanced salt solution.  The anterior chamber was inflated to a physiologic pressure with balanced salt solution.  No wound leaks were noted. Cefuroxime 0.1 ml of a 10mg /ml solution was injected into the anterior chamber for a dose of 1 mg of intracameral antibiotic at the completion of the case.   Timolol and Brimonidine drops were applied to the eye.  The patient was taken to the recovery room in stable condition without complications of anesthesia or surgery.  Frances Gallagher 11/14/2022, 12:03 PM

## 2022-11-14 NOTE — H&P (Signed)
[facility]   Primary Care Physician:  Jerl Mina, MD Ophthalmologist: Dr. Lockie Mola  Pre-Procedure History & Physical: HPI:  Frances Gallagher is a 67 y.o. female here for ophthalmic surgery.   Past Medical History:  Diagnosis Date   Anxiety    Anxiety disorder    Depression    H/O cardiac arrhythmia    Mild cognitive impairment    PONV (postoperative nausea and vomiting)     Past Surgical History:  Procedure Laterality Date   ABDOMINAL HYSTERECTOMY     KNEE SURGERY      Prior to Admission medications   Medication Sig Start Date End Date Taking? Authorizing Provider  ALPRAZolam Prudy Feeler) 0.25 MG tablet Take 1 tablet by mouth as needed. 11/11/16  Yes [provider]  calcium carbonate (OSCAL) 1500 (600 Ca) MG TABS tablet Take by mouth 2 (two) times daily with a meal.   Yes [provider]  Cholecalciferol (VITAMIN D3) 50 MCG (2000 UT) capsule Take 2,000 Units by mouth daily.   Yes [provider]  lamoTRIgine (LAMICTAL) 200 MG tablet Take 200 mg by mouth 2 (two) times daily.   Yes [provider]  magnesium gluconate (MAGONATE) 500 MG tablet Take 500 mg by mouth 2 (two) times daily.   Yes [provider]  Na Sulfate-K Sulfate-Mg Sulf 17.5-3.13-1.6 GM/177ML SOLN SMARTSIG:Bottle(s) By Mouth As Directed   Yes [provider]  sertraline (ZOLOFT) 50 MG tablet Take 1.5 tablets (75 mg total) by mouth at bedtime. 11/01/22 12/31/22 Yes Hisada, Barbee Cough, MD  vitamin B-12 (CYANOCOBALAMIN) 1000 MCG tablet Take 1 tablet by mouth 1 day or 1 dose.   Yes [provider]  zolpidem (AMBIEN) 10 MG tablet Take 5 mg by mouth at bedtime. 03/08/17  Yes [provider]  cyclobenzaprine (FLEXERIL) 10 MG tablet Take 1 tablet by mouth 3 (three) times daily. 11/08/16   [provider]  etodolac (LODINE) 500 MG tablet Take 1 tablet by mouth daily. 04/11/17   [provider]  oxyCODONE (OXY  IR/ROXICODONE) 5 MG immediate release tablet Take 1-2 tablets (5-10 mg total) by mouth every 4 (four) hours as needed for severe pain. Patient not taking: Reported on 11/07/2022 05/04/17   Ancil Linsey, MD    Allergies as of 10/13/2022   (No Known Allergies)    Family History  Problem Relation Age of Onset   Lung cancer Mother    Breast cancer Maternal Aunt     Social History   Socioeconomic History   Marital status: Married    Spouse name: Not on file   Number of children: Not on file   Years of education: Not on file   Highest education level: Master's degree (e.g., MA, MS, MEng, MEd, MSW, MBA)  Occupational History   Not on file  Tobacco Use   Smoking status: Never   Smokeless tobacco: Never  Substance and Sexual Activity   Alcohol use: No   Drug use: No   Sexual activity: Yes  Other Topics Concern   Not on file  Social History Narrative   Not on file   Social Determinants of Health   Financial Resource Strain: Low Risk  (10/24/2022)   Received from [facility], [facility]   Overall Financial Resource Strain (CARDIA)    Difficulty of Paying Living Expenses: Not hard at all  Food Insecurity: No Food Insecurity (10/24/2022)   Received from [facility], [facility]  Hunger Vital Sign    Worried About Running Out of Food in the Last Year: Never true    Ran Out of Food in the Last Year: Never true  Transportation Needs: No Transportation Needs (10/24/2022)   Received from [facility], [facility]   [facility] - Transportation    In the past 12 months, has lack of transportation kept you from medical appointments or from getting medications?: No    Lack of Transportation (Non-Medical): No  Physical Activity: Not on file  Stress: Not on file  Social Connections: Not on file  Intimate Partner Violence: Not on file    Review of Systems: See HPI, otherwise  negative ROS  Physical Exam: BP (!) 145/72   Temp (!) 97.5 F (36.4 C) (Temporal)   Resp 13   Ht 5' 4.49" (1.638 m)   Wt 63.7 kg   SpO2 97%   BMI 23.75 kg/m  General:   Alert,  pleasant and cooperative in NAD Head:  Normocephalic and atraumatic. Lungs:  Clear to auscultation.    Heart:  Regular rate and rhythm.   Impression/Plan: [name] is here for ophthalmic surgery.  Risks, benefits, limitations, and alternatives regarding ophthalmic surgery have been reviewed with the patient.  Questions have been answered.  All parties agreeable.   Lockie Mola, MD  11/14/2022, 10:49 AM

## 2022-11-15 ENCOUNTER — Encounter: Payer: Self-pay | Admitting: Ophthalmology

## 2022-11-17 NOTE — Anesthesia Postprocedure Evaluation (Signed)
Anesthesia Post Note  Patient: Aniyiah Zell Cole-Loy  Procedure(s) Performed: CATARACT EXTRACTION PHACO AND INTRAOCULAR LENS PLACEMENT (IOC) LEFT 4.75 00:36.7 (Left)  Patient location during evaluation: PACU Anesthesia Type: MAC Level of consciousness: awake and alert Pain management: pain level controlled Vital Signs Assessment: post-procedure vital signs reviewed and stable Respiratory status: spontaneous breathing, nonlabored ventilation, respiratory function stable and patient connected to nasal cannula oxygen Cardiovascular status: stable and blood pressure returned to baseline Postop Assessment: no apparent nausea or vomiting Anesthetic complications: no   No notable events documented.   Last Vitals:  Vitals:   11/14/22 1204 11/14/22 1209  BP: 138/80 (!) 144/78  Pulse: 64 67  Resp: 16 11  Temp: 36.6 C 36.6 C  SpO2: 95% 95%    Last Pain:  Vitals:   11/15/22 1024  TempSrc:   PainSc: 0-No pain                 Dean Goldner C Birdell Frasier

## 2022-12-07 ENCOUNTER — Ambulatory Visit: Admission: RE | Admit: 2022-12-07 | Payer: Medicare Other | Source: Home / Self Care | Admitting: Ophthalmology

## 2022-12-07 SURGERY — PHACOEMULSIFICATION, CATARACT, WITH IOL INSERTION
Anesthesia: Topical | Laterality: Right

## 2022-12-10 NOTE — Progress Notes (Signed)
Virtual Visit via Video Note  I connected with Frances Gallagher on 12/16/22 at 10:00 AM EDT by a video enabled telemedicine application and verified that I am speaking with the correct person using two identifiers.  Location: Patient: home Provider: office Persons participated in the visit- patient, provider    I discussed the limitations of evaluation and management by telemedicine and the availability of in person appointments. The patient expressed understanding and agreed to proceed.   I discussed the assessment and treatment plan with the patient. The patient was provided an opportunity to ask questions and all were answered. The patient agreed with the plan and demonstrated an understanding of the instructions.   The patient was advised to call back or seek an in-person evaluation if the symptoms worsen or if the condition fails to improve as anticipated.  I provided 25 minutes of non-face-to-face time during this encounter.   [name], MD    [facility] MD/PA/NP OP Progress Note  12/16/2022 10:37 AM Frances Gallagher  MRN:  [id number]  Chief Complaint:  Chief Complaint  Patient presents with   Follow-up   HPI:  According to the chart review, the following events have occurred since the last visit: The patient underwent surgery for cataract  This is a follow-up appointment for depression and anxiety.  She states that she underwent cataract surgery.  She has been worried about retinal hemorrhage.  She is unable to do things due to her eye condition.  However, she continues to go out to see horses.  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  She reports good relationship with her husband.  She thinks sertraline has been helpful.  Although she used to be miserable every waking moment, she does not feel that way anymore.  Her mood is smoothed out.  Although she is to be hysterical about her memory loss, she has been dealing it as it  is.  There were a few times she was very anxious when she was unable to figure out how to close the door, or how to close the faucet.  She feels a little drowsy and does not feel the feeling she should.  However, she would take the possible side effect as it is better than how she used to be experiencing.  After having discussion, she is willing to try fluoxetine this time.  She has a good appetite.  She enjoys reading and taking care of flowers.  She denies SI.   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 MDD (major depressive disorder), recurrent episode, mild (HCC)  F33.0     2. Anxiety disorder, unspecified type  F41.9       Past Psychiatric History: Please see initial evaluation for full details. I have reviewed the history. No updates at this time.     Past Medical History:  Past Medical History:  Diagnosis Date   Anxiety    Anxiety disorder    Depression    H/O cardiac arrhythmia    Mild cognitive impairment    PONV (postoperative nausea and vomiting)     Past Surgical History:  Procedure Laterality Date   ABDOMINAL HYSTERECTOMY     CATARACT EXTRACTION W/PHACO Left 11/14/2022   Procedure: CATARACT EXTRACTION PHACO AND INTRAOCULAR LENS  PLACEMENT (IOC) LEFT 4.75 00:36.7;  Surgeon: Lockie Mola, MD;  Location: [facility];  Service: Ophthalmology;  Laterality: Left;   KNEE SURGERY      Family Psychiatric History: Please see initial evaluation for full details. I have reviewed the history. No updates at this time.     Family History:  Family History  Problem Relation Age of Onset   Lung cancer Mother    Breast cancer Maternal Aunt     Social History:  Social History   Socioeconomic History   Marital status: Married    Spouse name: Not on file   Number of children:  Not on file   Years of education: Not on file   Highest education level: Master's degree (e.g., MA, MS, MEng, MEd, MSW, MBA)  Occupational History   Not on file  Tobacco Use   Smoking status: Never   Smokeless tobacco: Never  Substance and Sexual Activity   Alcohol use: No   Drug use: No   Sexual activity: Yes  Other Topics Concern   Not on file  Social History Narrative   Not on file   Social Determinants of Health   Financial Resource Strain: Low Risk  (10/24/2022)   Received from [facility], [facility]   Overall Financial Resource Strain (CARDIA)    Difficulty of Paying Living Expenses: Not hard at all  Food Insecurity: No Food Insecurity (10/24/2022)   Received from [facility], [facility]   Hunger Vital Sign    Worried About Running Out of Food in the Last Year: Never true    Ran Out of Food in the Last Year: Never true  Transportation Needs: No Transportation Needs (10/24/2022)   Received from [facility], [facility]   PRAPARE - Transportation    In the past 12 months, has lack of transportation kept you from medical appointments or from getting medications?: No    Lack of Transportation (Non-Medical): No  Physical Activity: Not on file  Stress: Not on file  Social Connections: Not on file    Allergies: No Known Allergies  Metabolic Disorder Labs: No results found for: "HGBA1C", "MPG" No results found for: "PROLACTIN" No results found for: "CHOL", "TRIG", "HDL", "CHOLHDL", "VLDL", "LDLCALC" No results found for: "TSH"  Therapeutic Level Labs: No results found for: "LITHIUM" No results found for: "VALPROATE" No results found for: "CBMZ"  Current Medications: Current Outpatient Medications  Medication Sig Dispense Refill   FLUoxetine (PROZAC) 20 MG capsule Take 1 capsule (20 mg total) by mouth daily. 30 capsule 1   ALPRAZolam (XANAX) 0.25 MG tablet  Take 1 tablet by mouth as needed.     calcium carbonate (OSCAL) 1500 (600 Ca) MG TABS tablet Take by mouth 2 (two) times daily with a meal.     Cholecalciferol (VITAMIN D3) 50 MCG (2000 UT) capsule Take 2,000 Units by mouth daily.     cyclobenzaprine (FLEXERIL) 10 MG tablet Take 1 tablet by mouth 3 (three) times daily.     etodolac (LODINE) 500 MG tablet Take 1 tablet by mouth daily.     lamoTRIgine (LAMICTAL) 200 MG tablet Take 200 mg by mouth 2 (two) times daily.     magnesium gluconate (MAGONATE) 500 MG tablet Take 500 mg by mouth 2 (two) times daily.     Na Sulfate-K Sulfate-Mg Sulf 17.5-3.13-1.6 GM/177ML SOLN SMARTSIG:Bottle(s) By Mouth As Directed     oxyCODONE (OXY IR/ROXICODONE) 5 MG immediate  release tablet Take 1-2 tablets (5-10 mg total) by mouth every 4 (four) hours as needed for severe pain. (Patient not taking: Reported on 11/07/2022) 20 tablet 0   vitamin B-12 (CYANOCOBALAMIN) 1000 MCG tablet Take 1 tablet by mouth 1 day or 1 dose.     zolpidem (AMBIEN) 10 MG tablet Take 5 mg by mouth at bedtime.     No current facility-administered medications for this visit.     Musculoskeletal: Strength & Muscle Tone:  N/A Gait & Station:  N/A Patient leans: N/A  Psychiatric Specialty Exam: Review of Systems  Psychiatric/Behavioral:  Negative for agitation, behavioral problems, confusion, decreased concentration, dysphoric mood, hallucinations, self-injury, sleep disturbance and suicidal ideas. The patient is nervous/anxious. The patient is not hyperactive.   All other systems reviewed and are negative.   There were no vitals taken for this visit.There is no height or weight on file to calculate BMI.  General Appearance: Fairly Groomed  Eye Contact:  Good  Speech:  Clear and Coherent  Volume:  Normal  Mood:   good  Affect:  Appropriate, Congruent, and Full Range  Thought Process:  Coherent  Orientation:  Full (Time, Place, and Person)  Thought Content: Logical   Suicidal Thoughts:   No  Homicidal Thoughts:  No  Memory:  Immediate;   Good  Judgement:  Good  Insight:  Good  Psychomotor Activity:  Normal  Concentration:  Concentration: Good and Attention Span: Good  Recall:  Good  Fund of Knowledge: Good  Language: Good  Akathisia:  No  Handed:  Right  AIMS (if indicated): not done  Assets:  Communication Skills Desire for Improvement  ADL's:  Intact  Cognition: WNL  Sleep:  Good   Screenings: GAD-7    Flowsheet Row Office Visit from 07/21/2022 in [facility]  Total GAD-7 Score 14      PHQ2-9    Flowsheet Row Office Visit from 09/12/2022 in [facility] Office Visit from 07/21/2022 in [facility]  PHQ-2 Total Score 5 3  PHQ-9 Total Score 17 11      Flowsheet Row Admission (Discharged) from 11/14/2022 in [facility]  C-SSRS RISK CATEGORY No Risk        Assessment and Plan:  Evoleth Dubeau is a 67 y.o. year old female with a history of depression, anxiety, seizure like activity, sleep disturbance, who presents for follow up appointment for below.   1. MDD (major depressive disorder), recurrent episode, mild (HCC) 2. 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  Will cross taper from sertraline to fluoxetine to mitigate the risk.    # mild cognitive disorder "04/01/2022 CBC, CMP, TSH, Vit B12, Vit B1, Vit D, folate, Treponema Pallidum (syphilis) screening cascade.  ATN profile ([facility] number [id number]) -  this includes Beta-Amyloid 42/40 ratio, Phosphorylated Tau 181 - pTau181 - both assess level of pathological change associated with Alzheimer's Disease, serum Neurofilament Light chain protein assess disease severity by measuring neurodegeneration).  APO Alzheimer's Risk Verdell Carmine [phone]) - E3/E3"  Neuropsych 06/2022- did  not meet criteria for neurocognitive disorder.  Unchanged. She is under the care of neurologist. The message was sent to inquire whether she would be suitable for lecanemab, acknowledging its risks, particularly considering her seizure disorder. We have not heard back. She will have an upcoming appointment in several weeks.   Plan Decrease sertraline 50 mg for 1 week, then discontinue. Alexithymia/drowsiness from 75 mg Start fluoxetine 20 mg daily Next appointment: 10/3 at 8 am for 30 mins, video - on Ambien 5 mg qhsprn, prescribed by neurology - on lamotrigine 200 mg bid for seizure   The patient demonstrates the following risk factors for suicide: Chronic risk factors for suicide include: psychiatric disorder of anxiety . Acute risk factors for suicide include: unemployment and loss (financial, interpersonal, professional). Protective factors for this patient include: positive social support, coping skills, and hope for the future. Considering these factors, the overall suicide risk at this point appears to be low. Patient is appropriate for outpatient follow up.     Collaboration of Care: Collaboration of Care: Other reviewed notes in Epic  Patient/Guardian was advised Release of Information must be obtained prior to any record release in order to collaborate their care with an outside provider. Patient/Guardian was advised if they have not already done so to contact the registration department to sign all necessary forms in order for [place] to release information regarding their care.   Consent: Patient/Guardian gives verbal consent for treatment and assignment of  benefits for services provided during this visit. Patient/Guardian expressed understanding and agreed to proceed.    [name], MD 12/16/2022, 10:37 AM

## 2022-12-16 ENCOUNTER — Telehealth: Payer: Medicare Other | Admitting: Psychiatry

## 2022-12-16 ENCOUNTER — Encounter: Payer: Self-pay | Admitting: Psychiatry

## 2022-12-16 DIAGNOSIS — F419 Anxiety disorder, unspecified: Secondary | ICD-10-CM | POA: Diagnosis not present

## 2022-12-16 DIAGNOSIS — F330 Major depressive disorder, recurrent, mild: Secondary | ICD-10-CM

## 2022-12-16 MED ORDER — FLUOXETINE HCL 20 MG PO CAPS: 20.0000 mg | ORAL_CAPSULE | Freq: Every day | ORAL | 1 refills | Status: DC

## 2022-12-16 NOTE — Patient Instructions (Signed)
Decrease sertraline 50 mg for 1 week, then discontinue.  Start fluoxetine 20 mg daily Next appointment: 10/3 at 8 am

## 2022-12-19 ENCOUNTER — Other Ambulatory Visit: Payer: Self-pay | Admitting: Psychiatry

## 2022-12-31 DIAGNOSIS — U071 COVID-19: Secondary | ICD-10-CM

## 2022-12-31 HISTORY — DX: COVID-19: U07.1

## 2023-01-09 ENCOUNTER — Encounter: Payer: Self-pay | Admitting: Ophthalmology

## 2023-01-10 NOTE — Discharge Instructions (Signed)

## 2023-01-11 ENCOUNTER — Other Ambulatory Visit: Payer: Self-pay

## 2023-01-11 ENCOUNTER — Encounter
Admission: RE | Disposition: A | Discharge status: Home / Self Care | Payer: Self-pay | Source: Home / Self Care | Attending: Ophthalmology

## 2023-01-11 ENCOUNTER — Ambulatory Visit: Payer: Medicare Other | Admitting: Anesthesiology

## 2023-01-11 ENCOUNTER — Encounter: Payer: Self-pay | Admitting: Ophthalmology

## 2023-01-11 ENCOUNTER — Ambulatory Visit
Admission: RE | Admit: 2023-01-11 | Discharge: 2023-01-11 | Disposition: A | Discharge status: Home / Self Care | Payer: Medicare Other | Attending: Ophthalmology | Admitting: Ophthalmology

## 2023-01-11 DIAGNOSIS — F32A Depression, unspecified: Secondary | ICD-10-CM | POA: Diagnosis not present

## 2023-01-11 DIAGNOSIS — Z8616 Personal history of COVID-19: Secondary | ICD-10-CM | POA: Insufficient documentation

## 2023-01-11 DIAGNOSIS — G4730 Sleep apnea, unspecified: Secondary | ICD-10-CM | POA: Insufficient documentation

## 2023-01-11 DIAGNOSIS — F419 Anxiety disorder, unspecified: Secondary | ICD-10-CM | POA: Diagnosis not present

## 2023-01-11 DIAGNOSIS — H2511 Age-related nuclear cataract, right eye: Secondary | ICD-10-CM | POA: Diagnosis present

## 2023-01-11 HISTORY — PX: CATARACT EXTRACTION W/PHACO: SHX586

## 2023-01-11 SURGERY — PHACOEMULSIFICATION, CATARACT, WITH IOL INSERTION
Anesthesia: Monitor Anesthesia Care | Laterality: Right

## 2023-01-11 MED ORDER — MIDAZOLAM HCL 2 MG/2ML IJ SOLN
INTRAMUSCULAR | Status: DC | PRN
  Administered 2023-01-11: 2 mg via INTRAVENOUS

## 2023-01-11 MED ORDER — SIGHTPATH DOSE#1 BSS IO SOLN
INTRAOCULAR | Status: DC | PRN
  Administered 2023-01-11: 69 mL via OPHTHALMIC

## 2023-01-11 MED ORDER — BRIMONIDINE TARTRATE-TIMOLOL 0.2-0.5 % OP SOLN
OPHTHALMIC | Status: DC | PRN
  Administered 2023-01-11: 1 [drp] via OPHTHALMIC

## 2023-01-11 MED ORDER — CEFUROXIME OPHTHALMIC INJECTION 1 MG/0.1 ML
INJECTION | OPHTHALMIC | Status: DC | PRN
  Administered 2023-01-11: 1 mg via INTRACAMERAL

## 2023-01-11 MED ORDER — SIGHTPATH DOSE#1 NA HYALUR & NA CHOND-NA HYALUR IO KIT
PACK | INTRAOCULAR | Status: DC | PRN
  Administered 2023-01-11: 1 via OPHTHALMIC

## 2023-01-11 MED ORDER — SIGHTPATH DOSE#1 BSS IO SOLN
INTRAOCULAR | Status: DC | PRN
  Administered 2023-01-11: 2 mL

## 2023-01-11 MED ORDER — LACTATED RINGERS IV SOLN: INTRAVENOUS | Status: DC

## 2023-01-11 MED ORDER — SIGHTPATH DOSE#1 BSS IO SOLN
INTRAOCULAR | Status: DC | PRN
  Administered 2023-01-11: 15 mL via INTRAOCULAR

## 2023-01-11 MED ORDER — TETRACAINE HCL 0.5 % OP SOLN
1.0000 [drp] | OPHTHALMIC | Status: DC | PRN
  Administered 2023-01-11 (×3): 1 [drp] via OPHTHALMIC

## 2023-01-11 MED ORDER — ARMC OPHTHALMIC DILATING DROPS
1.0000 | OPHTHALMIC | Status: DC | PRN
  Administered 2023-01-11 (×3): 1 via OPHTHALMIC

## 2023-01-11 SURGICAL SUPPLY — 9 items
CATARACT SUITE SIGHTPATH (MISCELLANEOUS) ×1 IMPLANT
FEE CATARACT SUITE SIGHTPATH (MISCELLANEOUS) ×1 IMPLANT
GLOVE SRG 8 PF TXTR STRL LF DI (GLOVE) ×1 IMPLANT
GLOVE SURG ENC TEXT LTX SZ7.5 (GLOVE) ×1 IMPLANT
GLOVE SURG UNDER POLY LF SZ8 (GLOVE) ×1
LENS IOL TECNIS EYHANCE 11.5 (Intraocular Lens) IMPLANT
NDL FILTER BLUNT 18X1 1/2 (NEEDLE) ×1 IMPLANT
NEEDLE FILTER BLUNT 18X1 1/2 (NEEDLE) ×1 IMPLANT
SYR 3ML LL SCALE MARK (SYRINGE) ×1 IMPLANT

## 2023-01-11 NOTE — Anesthesia Postprocedure Evaluation (Signed)
Anesthesia Post Note  Patient: Shamyah Sobers Cole-Loy  Procedure(s) Performed: CATARACT EXTRACTION PHACO AND INTRAOCULAR LENS PLACEMENT (IOC) RIGHT 4.66 00:33.8 (Right)  Patient location during evaluation: PACU Anesthesia Type: MAC Level of consciousness: awake and alert Pain management: pain level controlled Vital Signs Assessment: post-procedure vital signs reviewed and stable Respiratory status: spontaneous breathing, nonlabored ventilation, respiratory function stable and patient connected to nasal cannula oxygen Cardiovascular status: stable and blood pressure returned to baseline Postop Assessment: no apparent nausea or vomiting Anesthetic complications: no   No notable events documented.   Last Vitals:  Vitals:   01/11/23 1109 01/11/23 1209  BP: (!) 142/75 124/73  Pulse:  (!) 56  Resp: 12 12  Temp: 36.6 C 36.4 C  SpO2: 100% 100%    Last Pain:  Vitals:   01/11/23 1209  TempSrc: Temporal  PainSc: 0-No pain                 Lenard Simmer

## 2023-01-11 NOTE — Anesthesia Preprocedure Evaluation (Addendum)
Anesthesia Evaluation  Patient identified by MRN, date of birth, ID band Patient awake    Reviewed: Allergy & Precautions, H&P , NPO status , Patient's Chart, lab work & pertinent test results  History of Anesthesia Complications (+) PONV and history of anesthetic complications  Airway Mallampati: IV  TM Distance: <3 FB Neck ROM: Full  Mouth opening: Limited Mouth Opening  Dental  (+) Implants Upper teeth are mostly implants, definitely the central and lateral incisors:   Pulmonary neg shortness of breath, sleep apnea , Recent URI  (recent Covid, but much improved now), Resolved 06-06-22  Home Sleep Testing showing moderate, non-positional Obstructive Sleep Apnea (AHI - 12, RDI - 21) with raised concern for cardiac dysrhythmia.  Recommend AutoPAP at 5 - 20 cm H20. Consult to cardiology for further consideration of abnormal cardiac rhythm  We will send a referral for PAP therapy initiation    Pulmonary exam normal breath sounds clear to auscultation       Cardiovascular negative cardio ROS Normal cardiovascular exam Rhythm:Regular Rate:Normal  06-06-22 Home Sleep Testing showing moderate, non-positional Obstructive Sleep Apnea (AHI - 12, RDI - 21) with raised concern for cardiac dysrhythmia.  Recommend AutoPAP at 5 - 20 cm H20. Consult to cardiology for further consideration of abnormal cardiac rhythm  Cannot find echo. Unspecified arrhythmias     Neuro/Psych  PSYCHIATRIC DISORDERS Anxiety Depression    negative neurological ROS  negative psych ROS   GI/Hepatic negative GI ROS, Neg liver ROS,,,  Endo/Other  negative endocrine ROS    Renal/GU negative Renal ROS  negative genitourinary   Musculoskeletal negative musculoskeletal ROS (+)    Abdominal   Peds negative pediatric ROS (+)  Hematology negative hematology ROS (+)   Anesthesia Other Findings PONV (postoperative nausea and vomiting) Depression Anxiety  Mild  cognitive impairment Cardiac arrhythmias, unspecified type--cannot find echo Sleep apnea  Reproductive/Obstetrics negative OB ROS                             Anesthesia Physical Anesthesia Plan  ASA: 3  Anesthesia Plan: MAC   Post-op Pain Management:    Induction: Intravenous  PONV Risk Score and Plan: Midazolam and Treatment may vary due to age or medical condition  Airway Management Planned: Natural Airway and Nasal Cannula  Additional Equipment:   Intra-op Plan:   Post-operative Plan:   Informed Consent: I have reviewed the patients History and Physical, chart, labs and discussed the procedure including the risks, benefits and alternatives for the proposed anesthesia with the patient or authorized representative who has indicated his/her understanding and acceptance.     Dental Advisory Given  Plan Discussed with: Anesthesiologist, CRNA and Surgeon  Anesthesia Plan Comments: (Patient consented for risks of anesthesia including but not limited to:  - adverse reactions to medications - damage to eyes, teeth, lips or other oral mucosa - nerve damage due to positioning  - sore throat or hoarseness - Damage to heart, brain, nerves, lungs, other parts of body or loss of life  Patient voiced understanding.)        Anesthesia Quick Evaluation

## 2023-01-11 NOTE — Transfer of Care (Signed)
Immediate Anesthesia Transfer of Care Note  Patient: Frances Gallagher  Procedure(s) Performed: CATARACT EXTRACTION PHACO AND INTRAOCULAR LENS PLACEMENT (IOC) RIGHT 4.66 00:33.8 (Right)  Patient Location: PACU  Anesthesia Type: MAC  Level of Consciousness: awake, alert  and patient cooperative  Airway and Oxygen Therapy: Patient Spontanous Breathing and Patient connected to supplemental oxygen  Post-op Assessment: Post-op Vital signs reviewed, Patient's Cardiovascular Status Stable, Respiratory Function Stable, Patent Airway and No signs of Nausea or vomiting  Post-op Vital Signs: Reviewed and stable  Complications: No notable events documented.

## 2023-01-11 NOTE — H&P (Signed)
[facility] [place]   Primary Care Physician:  Jerl Mina, MD Ophthalmologist: Dr. Lockie Mola  Pre-Procedure History & Physical: HPI:  Frances Gallagher is a 67 y.o. female here for ophthalmic surgery.   Past Medical History:  Diagnosis Date   Anxiety    Anxiety disorder    COVID-19 12/31/2022   has had negative test since   Depression    H/O cardiac arrhythmia    Mild cognitive impairment    PONV (postoperative nausea and vomiting)     Past Surgical History:  Procedure Laterality Date   ABDOMINAL HYSTERECTOMY     CATARACT EXTRACTION W/PHACO Left 11/14/2022   Procedure: CATARACT EXTRACTION PHACO AND INTRAOCULAR LENS PLACEMENT (IOC) LEFT 4.75 00:36.7;  Surgeon: Lockie Mola, MD;  Location: [facility];  Service: Ophthalmology;  Laterality: Left;   KNEE SURGERY      Prior to Admission medications   Medication Sig Start Date End Date Taking? Authorizing Provider  ALPRAZolam Prudy Feeler) 0.25 MG tablet Take 1 tablet by mouth as needed. 11/11/16  Yes [provider]  Aspirin 162.5 MG CP24 Take by mouth daily.   Yes [provider]  calcium carbonate (OSCAL) 1500 (600 Ca) MG TABS tablet Take by mouth 2 (two) times daily with a meal.   Yes [provider]  Cholecalciferol (VITAMIN D3) 50 MCG (2000 UT) capsule Take 2,000 Units by mouth daily.   Yes [provider]  docusate sodium (COLACE) 50 MG capsule Take 50 mg by mouth 2 (two) times daily.   Yes [provider]  FLUoxetine (PROZAC) 20 MG capsule Take 1 capsule (20 mg total) by mouth daily. 12/16/22 02/14/23 Yes Hisada, Barbee Cough, MD  Glucosamine-Chondroitin (OSTEO BI-FLEX REGULAR STRENGTH PO) Take 2 tablets by mouth daily.   Yes [provider]  lamoTRIgine (LAMICTAL) 200 MG tablet Take 200 mg by mouth 2 (two) times daily.   Yes [provider]  magnesium gluconate (MAGONATE) 500 MG tablet Take 500 mg by mouth 2 (two) times daily.   Yes [provider]  Na Sulfate-K Sulfate-Mg Sulf 17.5-3.13-1.6 GM/177ML SOLN SMARTSIG:Bottle(s) By Mouth As Directed   Yes [provider]  zolpidem (AMBIEN) 10 MG tablet Take 5 mg by mouth at bedtime. 03/08/17  Yes [provider]  cyclobenzaprine (FLEXERIL) 10 MG tablet Take 1 tablet by mouth 3 (three) times daily. Patient not taking: Reported on 01/11/2023 11/08/16   [provider]  etodolac (LODINE) 500 MG tablet Take 1 tablet by mouth daily. Patient not taking: Reported on 01/11/2023 04/11/17   [provider]  vitamin B-12 (CYANOCOBALAMIN) 1000 MCG tablet Take 1 tablet by mouth 1 day or 1 dose. Patient not taking: Reported on 01/09/2023    [provider]    Allergies as of 01/09/2023   (No Known Allergies)    Family History  Problem Relation Age of Onset   Lung cancer Mother    Breast cancer Maternal Aunt     Social History   Socioeconomic History   Marital status: Married    Spouse name: Not on file   Number of children: Not on file   Years of education: Not on file   Highest education level: Master's degree (e.g., MA, MS, MEng, MEd, MSW, MBA)  Occupational History   Not on file  Tobacco Use   Smoking status: Never   Smokeless tobacco: Never  Vaping Use   Vaping status: Never Used  Substance and Sexual Activity   Alcohol use: No   Drug  use: No   Sexual activity: Yes  Other Topics Concern   Not on file  Social History Narrative   Not on file   Social Determinants of Health   Financial Resource Strain: Low Risk  (10/24/2022)   Received from [facility], [facility]   Overall Financial Resource Strain (CARDIA)    Difficulty of Paying Living Expenses: Not hard at all  Food Insecurity: No Food Insecurity (10/24/2022)   Received from [facility], [facility]   Hunger Vital Sign    Worried About Running Out of Food in the Last Year: Never true    Ran Out of  Food in the Last Year: Never true  Transportation Needs: No Transportation Needs (10/24/2022)   Received from [facility], [facility]   [facility] - Transportation    In the past 12 months, has lack of transportation kept you from medical appointments or from getting medications?: No    Lack of Transportation (Non-Medical): No  Physical Activity: Not on file  Stress: Not on file  Social Connections: Not on file  Intimate Partner Violence: Not on file    Review of Systems: See HPI, otherwise negative ROS  Physical Exam: BP (!) 142/75   Temp 97.8 F (36.6 C) (Temporal)   Resp 12   Ht 5' 4.5" (1.638 m)   Wt 62.6 kg   SpO2 100%   BMI 23.32 kg/m  General:   Alert,  pleasant and cooperative in NAD Head:  Normocephalic and atraumatic. Lungs:  Clear to auscultation.    Heart:  Regular rate and rhythm.   Impression/Plan: Frances Gallagher is here for ophthalmic surgery.  Risks, benefits, limitations, and alternatives regarding ophthalmic surgery have been reviewed with the patient.  Questions have been answered.  All parties agreeable.   Lockie Mola, MD  01/11/2023, 11:15 AM

## 2023-01-11 NOTE — H&P (Signed)
  LOCATION:  [facility]   PREOPERATIVE DIAGNOSIS:    Nuclear sclerotic cataract right eye. H25.11   POSTOPERATIVE DIAGNOSIS:  Nuclear sclerotic cataract right eye.     PROCEDURE:  Phacoemusification with posterior chamber intraocular lens placement of the right eye   ULTRASOUND TIME: Procedure(s): CATARACT EXTRACTION PHACO AND INTRAOCULAR LENS PLACEMENT (IOC) RIGHT 4.66 00:33.8 (Right)  LENS:   Implant Name Type Inv. Item Serial No. Manufacturer Lot No. LRB No. Used Action  LENS IOL TECNIS EYHANCE 11.5 - [id number] Intraocular Lens LENS IOL TECNIS EYHANCE 11.5 [id number] [employer]  Right 1 Implanted         SURGEON:  Deirdre Evener, MD   ANESTHESIA:  Topical with tetracaine drops and 2% Xylocaine jelly, augmented with 1% preservative-free intracameral lidocaine.    COMPLICATIONS:  None.   DESCRIPTION OF PROCEDURE:  The patient was identified in the holding room and transported to the operating room and placed in the supine position under the operating microscope.  The right eye was identified as the operative eye and it was prepped and draped in the usual sterile ophthalmic fashion.   A 1 millimeter clear-corneal paracentesis was made at the 12:00 position.  0.5 ml of preservative-free 1% lidocaine was injected into the anterior chamber. The anterior chamber was filled with Viscoat viscoelastic.  A 2.4 millimeter keratome was used to make a near-clear corneal incision at the 9:00 position.  A curvilinear capsulorrhexis was made with a cystotome and capsulorrhexis forceps.  Balanced salt solution was used to hydrodissect and hydrodelineate the nucleus.   Phacoemulsification was then used in stop and chop fashion to remove the lens nucleus and epinucleus.  The remaining cortex was then removed using the irrigation and aspiration handpiece. Provisc was then placed into the capsular bag to distend it for lens placement.  A lens was then injected into the capsular bag.   The remaining viscoelastic was aspirated.   Wounds were hydrated with balanced salt solution.  The anterior chamber was inflated to a physiologic pressure with balanced salt solution.  No wound leaks were noted. Cefuroxime 0.1 ml of a 10mg /ml solution was injected into the anterior chamber for a dose of 1 mg of intracameral antibiotic at the completion of the case.   Timolol and Brimonidine drops were applied to the eye.  The patient was taken to the recovery room in stable condition without complications of anesthesia or surgery.   Demarion Pondexter 01/11/2023, 12:00 PM

## 2023-01-12 NOTE — Op Note (Signed)
LOCATION:  [facility]   PREOPERATIVE DIAGNOSIS:    Nuclear sclerotic cataract right eye. H25.11   POSTOPERATIVE DIAGNOSIS:  Nuclear sclerotic cataract right eye.     PROCEDURE:  Phacoemusification with posterior chamber intraocular lens placement of the right eye   ULTRASOUND TIME: Procedure(s): CATARACT EXTRACTION PHACO AND INTRAOCULAR LENS PLACEMENT (IOC) RIGHT 4.66 00:33.8 (Right)  LENS:   Implant Name Type Inv. Item Serial No. Manufacturer Lot No. LRB No. Used Action  LENS IOL TECNIS EYHANCE 11.5 - [id number] Intraocular Lens LENS IOL TECNIS EYHANCE 11.5 [id number] [employer]  Right 1 Implanted         SURGEON:  Deirdre Evener, MD   ANESTHESIA:  Topical with tetracaine drops, augmented with 1% preservative-free intracameral lidocaine.    COMPLICATIONS:  None.   DESCRIPTION OF PROCEDURE:  The patient was identified in the holding room and transported to the operating room and placed in the supine position under the operating microscope.  The right eye was identified as the operative eye and it was prepped and draped in the usual sterile ophthalmic fashion.   A 1 millimeter clear-corneal paracentesis was made at the 12:00 position.  0.5 ml of preservative-free 1% lidocaine was injected into the anterior chamber. The anterior chamber was filled with Viscoat viscoelastic.  A 2.4 millimeter keratome was used to make a near-clear corneal incision at the 9:00 position.  A curvilinear capsulorrhexis was made with a cystotome and capsulorrhexis forceps.  Balanced salt solution was used to hydrodissect and hydrodelineate the nucleus.   Phacoemulsification was then used in stop and chop fashion to remove the lens nucleus and epinucleus.  The remaining cortex was then removed using the irrigation and aspiration handpiece. Provisc was then placed into the capsular bag to distend it for lens placement.  A lens was then injected into the capsular bag.  The remaining viscoelastic  was aspirated.   Wounds were hydrated with balanced salt solution.  The anterior chamber was inflated to a physiologic pressure with balanced salt solution.  No wound leaks were noted. Cefuroxime 0.1 ml of a 10mg /ml solution was injected into the anterior chamber for a dose of 1 mg of intracameral antibiotic at the completion of the case.   Timolol and Brimonidine drops were applied to the eye.  The patient was taken to the recovery room in stable condition without complications of anesthesia or surgery.   Frances Gallagher 01/12/2023, 8:06 AM

## 2023-01-27 NOTE — Progress Notes (Unsigned)
Virtual Visit via Video Note  I connected with Frances Gallagher on 02/02/23 at  8:00 AM EDT by a video enabled telemedicine application and verified that I am speaking with the correct person using two identifiers.  Location: Patient: home Provider: office Persons participated in the visit- patient, provider    I discussed the limitations of evaluation and management by telemedicine and the availability of in person appointments. The patient expressed understanding and agreed to proceed.    I discussed the assessment and treatment plan with the patient. The patient was provided an opportunity to ask questions and all were answered. The patient agreed with the plan and demonstrated an understanding of the instructions.   The patient was advised to call back or seek an in-person evaluation if the symptoms worsen or if the condition fails to improve as anticipated.  I provided 30 minutes of non-face-to-face time during this encounter.   [name], MD    [facility] MD/PA/NP OP Progress Note  02/02/2023 8:36 AM Frances Gallagher [facility]  MRN:  [id number]  Chief Complaint:  Chief Complaint  Patient presents with   Follow-up   HPI:  This is a follow-up appointment for depression and anxiety.  She states that there were 2 losses this week. They are uncles on her husband's side of the family.  One of them died unexpectedly after a fall, and another was sick with COPD and some bone cancer. Although she feels sad, she has been doing well.  She enjoyed a trip to [place], and meet with her brother and his wife.  Although she was very anxious over her eye condition with history of a hemorrhage, it was good.  She is trying to get back on routine, including horses and photography.  She also enjoys taking classes on aging.  She believes she does not feel "too leveled" as she used to be when she was on.  She does not feel depressed either.  She does not think anxiety is any better.  She struggled with  anxiety for many years, and she has a lot of tension in her shoulders.  She states fair as long as she takes Ambien.  She denied change in appetite.  She denies SI. She is willing to try higher dose of fluoxetine this time.  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 MDD (major depressive disorder), recurrent, in partial remission (HCC)  F33.41     2. Anxiety disorder, unspecified type  F41.9       Past Psychiatric History: Please see initial evaluation for full details. I have reviewed the history. No updates at this time.     Past Medical History:  Past Medical History:  Diagnosis Date   Anxiety    Anxiety disorder    COVID-19 12/31/2022   has had negative test since   Depression    H/O cardiac arrhythmia    Mild cognitive impairment    PONV (postoperative nausea and vomiting)     Past Surgical History:  Procedure Laterality Date   ABDOMINAL HYSTERECTOMY     CATARACT EXTRACTION W/PHACO Left 11/14/2022  Procedure: CATARACT EXTRACTION PHACO AND INTRAOCULAR LENS PLACEMENT (IOC) LEFT 4.75 00:36.7;  Surgeon: Lockie Mola, MD;  Location: [facility];  Service: Ophthalmology;  Laterality: Left;   CATARACT EXTRACTION W/PHACO Right 01/11/2023   Procedure: CATARACT EXTRACTION PHACO AND INTRAOCULAR LENS PLACEMENT (IOC) RIGHT 4.66 00:33.8;  Surgeon: Lockie Mola, MD;  Location: [facility];  Service: Ophthalmology;  Laterality: Right;   KNEE SURGERY      Family Psychiatric History: Please see initial evaluation for full details. I have reviewed the history. No updates at this time.     Family History:  Family History   Problem Relation Age of Onset   Lung cancer Mother    Breast cancer Maternal Aunt     Social History:  Social History   Socioeconomic History   Marital status: Married    Spouse name: Not on file   Number of children: Not on file   Years of education: Not on file   Highest education level: Master's degree (e.g., MA, MS, MEng, MEd, MSW, MBA)  Occupational History   Not on file  Tobacco Use   Smoking status: Never   Smokeless tobacco: Never  Vaping Use   Vaping status: Never Used  Substance and Sexual Activity   Alcohol use: No   Drug use: No   Sexual activity: Yes  Other Topics Concern   Not on file  Social History Narrative   Not on file   Social Determinants of Health   Financial Resource Strain: Low Risk  (10/24/2022)   Received from [facility], [facility]   Overall Financial Resource Strain (CARDIA)    Difficulty of Paying Living Expenses: Not hard at all  Food Insecurity: No Food Insecurity (10/24/2022)   Received from [facility], [facility]   Hunger Vital Sign    Worried About Running Out of Food in the Last Year: Never true    Ran Out of Food in the Last Year: Never true  Transportation Needs: No Transportation Needs (10/24/2022)   Received from [facility], [facility]   PRAPARE - Transportation    In the past 12 months, has lack of transportation kept you from medical appointments or from getting medications?: No    Lack of Transportation (Non-Medical): No  Physical Activity: Not on file  Stress: Not on file  Social Connections: Not on file    Allergies: No Known Allergies  Metabolic Disorder Labs: No results found for: "HGBA1C", "MPG" No results found for: "PROLACTIN" No results found for: "CHOL", "TRIG", "HDL", "CHOLHDL", "VLDL", "LDLCALC" No results found for: "TSH"  Therapeutic Level Labs: No results found for: "LITHIUM" No results  found for: "VALPROATE" No results found for: "CBMZ"  Current Medications: Current Outpatient Medications  Medication Sig Dispense Refill   FLUoxetine (PROZAC) 40 MG capsule Take 1 capsule (40 mg total) by mouth daily. 30 capsule 1   ALPRAZolam (XANAX) 0.25 MG tablet Take 1 tablet by mouth as needed.     Aspirin 162.5 MG CP24 Take by mouth daily.     calcium carbonate (OSCAL) 1500 (600 Ca) MG TABS tablet Take by mouth 2 (two) times daily with a meal.     Cholecalciferol (VITAMIN D3) 50 MCG (2000 UT) capsule Take 2,000 Units by mouth daily.     cyclobenzaprine (FLEXERIL) 10 MG tablet Take 1 tablet by mouth 3 (three) times daily. (Patient not taking: Reported on 01/11/2023)     docusate sodium (COLACE)  50 MG capsule Take 50 mg by mouth 2 (two) times daily.     etodolac (LODINE) 500 MG tablet Take 1 tablet by mouth daily. (Patient not taking: Reported on 01/11/2023)     Glucosamine-Chondroitin (OSTEO BI-FLEX REGULAR STRENGTH PO) Take 2 tablets by mouth daily.     lamoTRIgine (LAMICTAL) 200 MG tablet Take 200 mg by mouth 2 (two) times daily.     magnesium gluconate (MAGONATE) 500 MG tablet Take 500 mg by mouth 2 (two) times daily.     Na Sulfate-K Sulfate-Mg Sulf 17.5-3.13-1.6 GM/177ML SOLN SMARTSIG:Bottle(s) By Mouth As Directed     vitamin B-12 (CYANOCOBALAMIN) 1000 MCG tablet Take 1 tablet by mouth 1 day or 1 dose. (Patient not taking: Reported on 01/09/2023)     zolpidem (AMBIEN) 10 MG tablet Take 5 mg by mouth at bedtime.     No current facility-administered medications for this visit.     Musculoskeletal: Strength & Muscle Tone:  N/A Gait & Station:  N/A Patient leans: N/A  Psychiatric Specialty Exam: Review of Systems  Psychiatric/Behavioral:  Positive for sleep disturbance. Negative for agitation, behavioral problems, confusion, decreased concentration, dysphoric mood, hallucinations, self-injury and suicidal ideas. The patient is nervous/anxious. The patient is not hyperactive.   All  other systems reviewed and are negative.   There were no vitals taken for this visit.There is no height or weight on file to calculate BMI.  General Appearance: Well Groomed  Eye Contact:  Good  Speech:  Clear and Coherent  Volume:  Normal  Mood:   good  Affect:  Appropriate, Congruent, and calm  Thought Process:  Coherent  Orientation:  Full (Time, Place, and Person)  Thought Content: Logical   Suicidal Thoughts:  No  Homicidal Thoughts:  No  Memory:  Immediate;   Good  Judgement:  Good  Insight:  Good  Psychomotor Activity:  Normal  Concentration:  Concentration: Good and Attention Span: Good  Recall:  Good  Fund of Knowledge: Good  Language: Good  Akathisia:  No  Handed:  Right  AIMS (if indicated): not done  Assets:  Communication Skills Desire for Improvement  ADL's:  Intact  Cognition: WNL  Sleep:  Fair   Screenings: GAD-7    Flowsheet Row Office Visit from 07/21/2022 in [facility]  Total GAD-7 Score 14      PHQ2-9    Flowsheet Row Office Visit from 09/12/2022 in [facility] Office Visit from 07/21/2022 in [facility]  PHQ-2 Total Score 5 3  PHQ-9 Total Score 17 11      Flowsheet Row Admission (Discharged) from 01/11/2023 in [facility] Admission (Discharged) from 11/14/2022 in [facility]  C-SSRS RISK CATEGORY No Risk No Risk        Assessment and Plan:  Dineen Conradt is a 67 y.o. year old female with a history of depression, anxiety, seizure like activity, sleep disturbance, who presents for follow up appointment for below.   1. MDD (major depressive disorder), recurrent, in partial remission (HCC) 2. 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  Will higher dose to optimize treatment for anxiety given she has chronic anxiety with significant muscle tension.  She is unable to afford therapy despite her interest.   # mild cognitive disorder "04/01/2022 CBC, CMP, TSH, Vit B12, Vit B1, Vit D, folate, Treponema Pallidum (syphilis) screening cascade.  ATN profile ([facility] number [id number]) - this includes Beta-Amyloid 42/40 ratio, Phosphorylated Tau 181 - pTau181 - both assess level of pathological change associated with Alzheimer's Disease, serum Neurofilament Light chain protein assess disease severity by measuring neurodegeneration).  APO Alzheimer's Risk Verdell Carmine [phone]) - E3/E3"  Neuropsych 06/2022- did not meet criteria for neurocognitive disorder.  Unchanged. She is under the care of neurologist. The message was sent to inquire whether she would be suitable for lecanemab, acknowledging its risks, particularly considering her seizure disorder. We have not heard back. She will have an upcoming appointment in several weeks.  # Insomnia She reports fair sleep, although she used to sleep better when she was on sertraline.  She has been on Ambien, long-term use, and she reportedly has history of insomnia for many years.  Will continue to work on sleep hygiene.  The hope is that the above intervention for anxiety was added insomnia to some extent.    Plan Increase fluoxetine 40 mg daily Next appointment:  11/27 at 8 am, video - on Ambien 5 mg qhsprn, prescribed by neurology - on lamotrigine 200 mg bid for seizure  Past trials- sertraline (alexithymia, drowsiness)   The patient demonstrates the following risk factors for suicide: Chronic risk factors for suicide include:  psychiatric disorder of anxiety . Acute risk factors for suicide include: unemployment and loss (financial, interpersonal, professional). Protective factors for this patient include: positive social support, coping skills, and hope for the future. Considering these factors, the overall suicide risk at this point appears to be low. Patient is appropriate for outpatient follow up.       Collaboration of Care: Collaboration of Care: Other reviewed notes in Epic  Patient/Guardian was advised Release of Information must be obtained prior to any record release in order to collaborate their care with an outside provider. Patient/Guardian was advised if they have not already done so to contact the registration department to sign all necessary forms in order for [place] to release information regarding their care.   Consent: Patient/Guardian gives verbal consent for treatment and assignment of benefits for services provided during this visit. Patient/Guardian expressed understanding and agreed to proceed.    [name], MD 02/02/2023, 8:36 AM

## 2023-02-02 ENCOUNTER — Telehealth: Payer: Medicare Other | Admitting: Psychiatry

## 2023-02-02 ENCOUNTER — Encounter: Payer: Self-pay | Admitting: Psychiatry

## 2023-02-02 DIAGNOSIS — F419 Anxiety disorder, unspecified: Secondary | ICD-10-CM

## 2023-02-02 DIAGNOSIS — F3341 Major depressive disorder, recurrent, in partial remission: Secondary | ICD-10-CM

## 2023-02-02 MED ORDER — FLUOXETINE HCL 40 MG PO CAPS: 40.0000 mg | ORAL_CAPSULE | Freq: Every day | ORAL | 1 refills | Status: DC

## 2023-02-02 NOTE — Patient Instructions (Signed)
Increase fluoxetine 40 mg daily Next appointment:  11/27 at 8 am

## 2023-02-13 ENCOUNTER — Other Ambulatory Visit: Payer: Self-pay | Admitting: Psychiatry

## 2023-03-26 NOTE — Progress Notes (Unsigned)
Virtual Visit via Video Note  I connected with Frances Gallagher on 03/29/23 at  8:00 AM EST by a video enabled telemedicine application and verified that I am speaking with the correct person using two identifiers.  Location: Patient: home Provider: office Persons participated in the visit- patient, provider    I discussed the limitations of evaluation and management by telemedicine and the availability of in person appointments. The patient expressed understanding and agreed to proceed.   I discussed the assessment and treatment plan with the patient. The patient was provided an opportunity to ask questions and all were answered. The patient agreed with the plan and demonstrated an understanding of the instructions.   The patient was advised to call back or seek an in-person evaluation if the symptoms worsen or if the condition fails to improve as anticipated.  I provided 30 minutes of non-face-to-face time during this encounter.   [name], MD       [facility] MD/PA/NP OP Progress Note  03/29/2023 12:51 PM Frances Gallagher [facility]  MRN:  [id number]  Chief Complaint:  Chief Complaint  Patient presents with   Follow-up   HPI:  This is a follow-up appointment for depression, anxiety, insomnia and cognitive disorder.  She states that she has not noticed any change in her mood since uptitration of fluoxetine.  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  She also thinks Alzheimer is progressing.  She cannot remember things, and has more difficulty in spelling.  She has been trying to stay busy.  She sleeps up to 5 hours, taking Ambien.  She states that she does not want to play with the treatment for insomnia as she reportedly underwent extensive workup, and tried a several medication, although she cannot recall the names.  She is having a fall many times, including the time she tripped over.  She agrees to discuss this with her neurologist.  She is also interested in  newer treatment.  Although she has been following the advice from Dr. Sherryll Burger such as being very active, never drinking alcohol, she is concerned about her condition.  She has good appetite.  She feels down at times due to her horse having a stroke, and loss of her family.  However, she thinks her mood has been better compared to before since being started on fluoxetine.  She denies SI.  She feels comfortable to lower the dose of fluoxetine at this time.  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 MDD (major depressive disorder), recurrent, in partial remission (HCC)  F33.41     2. Anxiety disorder, unspecified type  F41.9     3. Mild cognitive disorder  F09     4. Insomnia, unspecified type  G47.00       Past Psychiatric History: Please see initial evaluation for full details. I have reviewed the history. No updates at this time.     Past Medical History:  Past Medical History:  Diagnosis Date   Anxiety    Anxiety disorder    COVID-19 12/31/2022   has had negative test since   Depression    H/O cardiac arrhythmia    Mild cognitive impairment    PONV (postoperative nausea and vomiting)     Past Surgical History:  Procedure Laterality Date  ABDOMINAL HYSTERECTOMY     CATARACT EXTRACTION W/PHACO Left 11/14/2022   Procedure: CATARACT EXTRACTION PHACO AND INTRAOCULAR LENS PLACEMENT (IOC) LEFT 4.75 00:36.7;  Surgeon: Lockie Mola, MD;  Location: [facility];  Service: Ophthalmology;  Laterality: Left;   CATARACT EXTRACTION W/PHACO Right 01/11/2023   Procedure: CATARACT EXTRACTION PHACO AND INTRAOCULAR LENS PLACEMENT (IOC) RIGHT 4.66 00:33.8;  Surgeon: Lockie Mola, MD;  Location: [facility];  Service: Ophthalmology;  Laterality: Right;   KNEE SURGERY      Family  Psychiatric History: Please see initial evaluation for full details. I have reviewed the history. No updates at this time.     Family History:  Family History  Problem Relation Age of Onset   Lung cancer Mother    Breast cancer Maternal Aunt     Social History:  Social History   Socioeconomic History   Marital status: Married    Spouse name: Not on file   Number of children: Not on file   Years of education: Not on file   Highest education level: Master's degree (e.g., MA, MS, MEng, MEd, MSW, MBA)  Occupational History   Not on file  Tobacco Use   Smoking status: Never   Smokeless tobacco: Never  Vaping Use   Vaping status: Never Used  Substance and Sexual Activity   Alcohol use: No   Drug use: No   Sexual activity: Yes  Other Topics Concern   Not on file  Social History Narrative   Not on file   Social Determinants of Health   Financial Resource Strain: Low Risk  (10/24/2022)   Received from [facility], [facility]   Overall Financial Resource Strain (CARDIA)    Difficulty of Paying Living Expenses: Not hard at all  Food Insecurity: No Food Insecurity (10/24/2022)   Received from [facility], [facility]   Hunger Vital Sign    Worried About Running Out of Food in the Last Year: Never true    Ran Out of Food in the Last Year: Never true  Transportation Needs: No Transportation Needs (10/24/2022)   Received from [facility], [facility]   PRAPARE - Transportation    In the past 12 months, has lack of transportation kept you from medical appointments or from getting medications?: No    Lack of Transportation (Non-Medical): No  Physical Activity: Not on file  Stress: Not on file  Social Connections: Not on file    Allergies: No Known Allergies  Metabolic Disorder Labs: No results found for: "HGBA1C", "MPG" No results found for: "PROLACTIN" No results  found for: "CHOL", "TRIG", "HDL", "CHOLHDL", "VLDL", "LDLCALC" No results found for: "TSH"  Therapeutic Level Labs: No results found for: "LITHIUM" No results found for: "VALPROATE" No results found for: "CBMZ"  Current Medications: Current Outpatient Medications  Medication Sig Dispense Refill   FLUoxetine (PROZAC) 20 MG capsule Take 1 capsule (20 mg total) by mouth daily. 30 capsule 2   ALPRAZolam (XANAX) 0.25 MG tablet Take 1 tablet by mouth as needed.     Aspirin 162.5 MG CP24 Take by mouth daily.     calcium carbonate (OSCAL) 1500 (600 Ca) MG TABS tablet Take by mouth 2 (two) times daily with a meal.     Cholecalciferol (VITAMIN D3) 50 MCG (2000 UT) capsule Take 2,000 Units by mouth daily.     cyclobenzaprine (FLEXERIL) 10 MG tablet Take 1 tablet by mouth 3 (three) times daily. (  Patient not taking: Reported on 01/11/2023)     docusate sodium (COLACE) 50 MG capsule Take 50 mg by mouth 2 (two) times daily.     etodolac (LODINE) 500 MG tablet Take 1 tablet by mouth daily. (Patient not taking: Reported on 01/11/2023)     Glucosamine-Chondroitin (OSTEO BI-FLEX REGULAR STRENGTH PO) Take 2 tablets by mouth daily.     lamoTRIgine (LAMICTAL) 200 MG tablet Take 200 mg by mouth 2 (two) times daily.     magnesium gluconate (MAGONATE) 500 MG tablet Take 500 mg by mouth 2 (two) times daily.     Na Sulfate-K Sulfate-Mg Sulf 17.5-3.13-1.6 GM/177ML SOLN SMARTSIG:Bottle(s) By Mouth As Directed     vitamin B-12 (CYANOCOBALAMIN) 1000 MCG tablet Take 1 tablet by mouth 1 day or 1 dose. (Patient not taking: Reported on 01/09/2023)     zolpidem (AMBIEN) 10 MG tablet Take 5 mg by mouth at bedtime.     No current facility-administered medications for this visit.     Musculoskeletal: Strength & Muscle Tone:  N/A Gait & Station:  N/A Patient leans: N/A  Psychiatric Specialty Exam: Review of Systems  Psychiatric/Behavioral:  Negative for agitation, behavioral problems, confusion, decreased concentration,  dysphoric mood, hallucinations, self-injury, sleep disturbance and suicidal ideas. The patient is nervous/anxious. The patient is not hyperactive.   All other systems reviewed and are negative.   There were no vitals taken for this visit.There is no height or weight on file to calculate BMI.  General Appearance: Well Groomed  Eye Contact:  Good  Speech:  Clear and Coherent  Volume:  Normal  Mood:   good  Affect:  Appropriate, Congruent, and Full Range  Thought Process:  Coherent  Orientation:  Full (Time, Place, and Person)  Thought Content: Logical   Suicidal Thoughts:  No  Homicidal Thoughts:  No  Memory:  Immediate;   Good  Judgement:  Good  Insight:  Good  Psychomotor Activity:  Normal  Concentration:  Concentration: Good and Attention Span: Good  Recall:  Good  Fund of Knowledge: Good  Language: Good  Akathisia:  No  Handed:  Right  AIMS (if indicated): not done  Assets:  Communication Skills Desire for Improvement  ADL's:  Intact  Cognition: WNL  Sleep:  Good   Screenings: GAD-7    Flowsheet Row Office Visit from 07/21/2022 in [facility]  Total GAD-7 Score 14      PHQ2-9    Flowsheet Row Office Visit from 09/12/2022 in [facility] Office Visit from 07/21/2022 in [facility]  PHQ-2 Total Score 5 3  PHQ-9 Total Score 17 11      Flowsheet Row Admission (Discharged) from 01/11/2023 in [facility] Admission (Discharged) from 11/14/2022 in [facility]  C-SSRS RISK CATEGORY No Risk No Risk        Assessment and Plan:  Frances Gallagher is a 67 y.o. year old female with a history of depression, anxiety, seizure like activity, sleep disturbance, who presents for follow up appointment for below.   1. MDD (major depressive disorder), recurrent, in partial remission (HCC) 2. Anxiety  disorder, unspecified type Acute stressors include: recent diagnosis of MCI secondary to Alzheimer, her brother with CHF, her sister being hit by a car Other stressors include:  lost her three family members,    History: history of depression including SI 1998, though no treatment in the past  She had adverse reaction of worsening in hand tremors since uptitration of fluoxetine, although she denies any further effect on her mood.  Will reduce the dose to mitigate the risk while it has been effective at low dose for depression and mood.  Although she will greatly benefit from CBT, she is unable to afford this despite her interest.   3. Mild cognitive disorder  "04/01/2022 CBC, CMP, TSH, Vit B12, Vit B1, Vit D, folate, Treponema Pallidum (syphilis) screening cascade.  ATN profile ([facility] number [id number]) - this includes Beta-Amyloid 42/40 ratio, Phosphorylated Tau 181 - pTau181 - both assess level of pathological change associated with Alzheimer's Disease, serum Neurofilament Light chain protein assess disease severity by measuring neurodegeneration).  APO Alzheimer's Risk Verdell Carmine [phone]) - E3/E3"   Neuropsych 06/2022- did not meet criteria for neurocognitive disorder.  She reports worsening in memory and speech.  She is under the care by a neurologist.  Although the message was sent to inquire whether she will be suitable for lacenemab, acknowledging its risks, particularly considering her seizure disorder, we have not heard back.  She will have an upcoming appointment in January, and she is willing to inquire this.   4. Insomnia, unspecified type - she reportedly tried several medication and underwent extensive evaluation in the past- details unknown She reports family history of insomnia, and has history of chronic insomnia.  She has been on the Ambien for many years.  Although it has been discussed regarding the risk of fall, possible adverse reaction on memory, she is not interested  in CBT I or any change in her medication.  She expressed understanding of the risk, and has agreed to try to minimize this use.     Plan Decrease fluoxetine 20 mg daily - tremor from 40 mg Next appointment:  2/19 at 8 am for 30 min, video - on Ambien 5 mg qhsprn, prescribed by neurology - on lamotrigine 200 mg bid for seizure   Past trials- sertraline (alexithymia, drowsiness)   The patient demonstrates the following risk factors for suicide: Chronic risk factors for suicide include: psychiatric disorder of anxiety . Acute risk factors for suicide include: unemployment and loss (financial, interpersonal, professional). Protective factors for this patient include: positive social support, coping skills, and hope for the future. Considering these factors, the overall suicide risk at this point appears to be low. Patient is appropriate for outpatient follow up.     Collaboration of Care: Collaboration of Care: Other reviewed notes in Epic  Patient/Guardian was advised Release of Information must be obtained prior to any record release in order to collaborate their care with an outside provider. Patient/Guardian was advised if they have not already done so to contact the registration department to sign all necessary forms in order for [place] to release information regarding their care.   Consent: Patient/Guardian gives verbal consent for treatment and assignment of benefits for services provided during this visit. Patient/Guardian expressed understanding and agreed to proceed.   The duration of the time spent on the following activities on the date of the encounter was 30 minutes.   Preparing to see the patient (e.g., review of test, records)  Obtaining and/or reviewing separately obtained history  Performing a medically necessary exam and/or evaluation  Counseling and educating the patient/family/caregiver  Ordering medications, tests, or procedures  Referring and communicating with other  healthcare professionals (when not reported separately)  Documenting clinical information in the electronic or paper health record  Independently interpreting results of  tests/labs and communication of results to the family or caregiver  Care coordination (when not reported separately)   [name], MD 03/29/2023, 12:51 PM

## 2023-03-29 ENCOUNTER — Encounter: Payer: Self-pay | Admitting: Psychiatry

## 2023-03-29 ENCOUNTER — Telehealth: Payer: Medicare Other | Admitting: Psychiatry

## 2023-03-29 DIAGNOSIS — G4700 Insomnia, unspecified: Secondary | ICD-10-CM | POA: Diagnosis not present

## 2023-03-29 DIAGNOSIS — F3341 Major depressive disorder, recurrent, in partial remission: Secondary | ICD-10-CM

## 2023-03-29 DIAGNOSIS — F419 Anxiety disorder, unspecified: Secondary | ICD-10-CM | POA: Diagnosis not present

## 2023-03-29 DIAGNOSIS — F09 Unspecified mental disorder due to known physiological condition: Secondary | ICD-10-CM

## 2023-03-29 MED ORDER — FLUOXETINE HCL 20 MG PO CAPS: 20.0000 mg | ORAL_CAPSULE | Freq: Every day | ORAL | 2 refills | Status: DC

## 2023-03-31 ENCOUNTER — Other Ambulatory Visit: Payer: Self-pay | Admitting: Psychiatry

## 2023-05-18 ENCOUNTER — Other Ambulatory Visit: Payer: Self-pay | Admitting: Student

## 2023-05-18 DIAGNOSIS — G3184 Mild cognitive impairment, so stated: Secondary | ICD-10-CM

## 2023-05-18 DIAGNOSIS — R569 Unspecified convulsions: Secondary | ICD-10-CM

## 2023-05-23 ENCOUNTER — Ambulatory Visit
Admission: RE | Admit: 2023-05-23 | Discharge: 2023-05-23 | Disposition: A | Discharge status: Home / Self Care | Payer: Medicare Other | Source: Ambulatory Visit | Attending: Student | Admitting: Student

## 2023-05-23 DIAGNOSIS — G3184 Mild cognitive impairment, so stated: Secondary | ICD-10-CM | POA: Diagnosis present

## 2023-05-23 DIAGNOSIS — R569 Unspecified convulsions: Secondary | ICD-10-CM | POA: Insufficient documentation

## 2023-05-28 ENCOUNTER — Other Ambulatory Visit: Payer: Self-pay | Admitting: Psychiatry

## 2023-06-15 NOTE — Progress Notes (Signed)
Virtual Visit via Video Note  I connected with Frances Gallagher on 06/21/23 at  8:30 AM EST by a video enabled telemedicine application and verified that I am speaking with the correct person using two identifiers.  Location: Patient: home Provider: office Persons participated in the visit- patient, provider    I discussed the limitations of evaluation and management by telemedicine and the availability of in person appointments. The patient expressed understanding and agreed to proceed.   I discussed the assessment and treatment plan with the patient. The patient was provided an opportunity to ask questions and all were answered. The patient agreed with the plan and demonstrated an understanding of the instructions.   The patient was advised to call back or seek an in-person evaluation if the symptoms worsen or if the condition fails to improve as anticipated.  Frances Hotter, MD    [facility] MD/PA/NP OP Progress Note  06/21/2023 12:11 PM Frances Gallagher [facility]  MRN:  [id number]  Chief Complaint:  Chief Complaint  Patient presents with   Follow-up   HPI:  - she was seen by cardiologist for bradycardia. 30 day even monitor, cardiac [place] was scheduled.  This is a follow-up appointment for depression and anxiety.  She states that she is going through evaluation for neurological and cardiological condition.  She now thinks her cognitive ability will hold longer than her physical health, which is a relief to her.  She states that she is living against medical advice.  Although her doctors do not want her to ride horses due to concern of fall, she is still doing this.  She cannot imagine losing this activity.  She thinks she is aware of danger, and that she will quit if she becomes more unsteady.  She thinks she would know when she needs to quit. 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  She feels flat lately, which she attributes to the acceptance.  She continues  to feel anxious.  She has fair appetite.  She has fair sleep.  She denies SI.  She agrees with the plan as outlined below.   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 MDD (major depressive disorder), recurrent, in partial remission (HCC)  F33.41     2. Anxiety disorder, unspecified type  F41.9       Past Psychiatric History: Please see initial evaluation for full details. I have reviewed the history. No updates at this time.     Past Medical History:  Past Medical History:  Diagnosis Date   Anxiety    Anxiety disorder    COVID-19 12/31/2022   has had negative test since   Depression    H/O cardiac arrhythmia    Mild cognitive impairment    PONV (postoperative nausea and vomiting)     Past Surgical History:  Procedure Laterality Date   ABDOMINAL HYSTERECTOMY     CATARACT EXTRACTION W/PHACO Left 11/14/2022   Procedure: CATARACT EXTRACTION PHACO AND INTRAOCULAR LENS PLACEMENT (IOC) LEFT 4.75 00:36.7;  Surgeon: Lockie Mola, MD;  Location[facility];  Service: Ophthalmology;  Laterality: Left;   CATARACT EXTRACTION W/PHACO Right 01/11/2023   Procedure: CATARACT EXTRACTION PHACO AND INTRAOCULAR LENS PLACEMENT (IOC) RIGHT 4.66 00:33.8;  Surgeon: Lockie Mola, MD;  Location: [facility];  Service: Ophthalmology;  Laterality: Right;   KNEE SURGERY      Family Psychiatric History: Please see initial evaluation for full details. I have reviewed the history. No updates at this time.     Family History:  Family History  Problem Relation Age of Onset   Lung cancer Mother    Breast cancer Maternal Aunt     Social History:  Social History   Socioeconomic History   Marital status: Married    Spouse name: Not on file   Number of children: Not on file   Years of  education: Not on file   Highest education level: Master's degree (e.g., MA, MS, MEng, MEd, MSW, MBA)  Occupational History   Not on file  Tobacco Use   Smoking status: Never   Smokeless tobacco: Never  Vaping Use   Vaping status: Never Used  Substance and Sexual Activity   Alcohol use: No   Drug use: No   Sexual activity: Yes  Other Topics Concern   Not on file  Social History Narrative   Not on file   Social Drivers of Health   Financial Resource Strain: Low Risk  (05/03/2023)   Received [facility]   Overall Financial Resource Strain (CARDIA)    Difficulty of Paying Living Expenses: Not hard at all  Food Insecurity: No Food Insecurity (05/03/2023)   [facility]   Hunger Vital Sign    Worried About Running Out of Food in the Last Year: Never true    Ran Out of Food in the Last Year: Never true  Transportation Needs: No Transportation Needs (05/03/2023)   Received from [facility]    In the past 12 months, has lack of transportation kept you from medical appointments or from getting medications?: No    Lack of Transportation (Non-Medical): No  Physical Activity: Not on file  Stress: Not on file  Social Connections: Not on file    Allergies: No Known Allergies  Metabolic Disorder Labs: No results found for: "HGBA1C", "MPG" No results found for: "PROLACTIN" No results found for: "CHOL", "TRIG", "HDL", "CHOLHDL", "VLDL", "LDLCALC" No results found for: "TSH"  Therapeutic Level Labs: No results found for: "LITHIUM" No results found for: "VALPROATE" No results found for: "CBMZ"  Current Medications: Current Outpatient Medications  Medication Sig Dispense Refill   FLUoxetine (PROZAC) 40 MG capsule Take 1 capsule (40 mg total) by mouth daily. 30 capsule 2   ALPRAZolam (XANAX) 0.25 MG tablet Take 1 tablet by mouth as needed.     Aspirin 162.5 MG CP24 Take by mouth daily.      calcium carbonate (OSCAL) 1500 (600 Ca) MG TABS tablet Take by mouth 2 (two) times daily with a meal.     Cholecalciferol (VITAMIN D3) 50 MCG (2000 UT) capsule Take 2,000 Units by mouth daily.     cyclobenzaprine (FLEXERIL) 10 MG tablet Take 1 tablet by mouth 3 (three) times daily. (Patient not taking: Reported on 01/11/2023)     docusate sodium (COLACE) 50 MG capsule Take 50 mg by mouth 2 (two) times daily.     etodolac (LODINE) 500 MG tablet Take 1 tablet by mouth daily. (Patient not taking: Reported on 01/11/2023)     Glucosamine-Chondroitin (OSTEO BI-FLEX REGULAR STRENGTH PO) Take 2 tablets by mouth daily.     lamoTRIgine (LAMICTAL) 200 MG tablet Take 200 mg by mouth 2 (two) times daily.     magnesium  gluconate (MAGONATE) 500 MG tablet Take 500 mg by mouth 2 (two) times daily.     Na Sulfate-K Sulfate-Mg Sulf 17.5-3.13-1.6 GM/177ML SOLN SMARTSIG:Bottle(s) By Mouth As Directed     vitamin B-12 (CYANOCOBALAMIN) 1000 MCG tablet Take 1 tablet by mouth 1 day or 1 dose. (Patient not taking: Reported on 01/09/2023)     zolpidem (AMBIEN) 10 MG tablet Take 5 mg by mouth at bedtime.     No current facility-administered medications for this visit.     Musculoskeletal: Strength & Muscle Tone: within normal limits Gait & Station: normal Patient leans: N/A  Psychiatric Specialty Exam: Review of Systems  Psychiatric/Behavioral:  Negative for agitation, behavioral problems, confusion, decreased concentration, dysphoric mood, hallucinations, self-injury, sleep disturbance and suicidal ideas. The patient is nervous/anxious. The patient is not hyperactive.   All other systems reviewed and are negative.   There were no vitals taken for this visit.There is no height or weight on file to calculate BMI.  General Appearance: Well Groomed  Eye Contact:  Good  Speech:  Clear and Coherent  Volume:  Normal  Mood:   flat  Affect:  Appropriate  Thought Process:  Coherent  Orientation:  Full (Time, Place, and  Person)  Thought Content: Logical   Suicidal Thoughts:  No  Homicidal Thoughts:  No  Memory:  Immediate;   Good  Judgement:  Good  Insight:  Good  Psychomotor Activity:  Normal  Concentration:  Concentration: Good and Attention Span: Good  Recall:  Good  Fund of Knowledge: Good  Language: Good  Akathisia:  No  Handed:  Right  AIMS (if indicated): not done  Assets:  Communication Skills Desire for Improvement  ADL's:  Intact  Cognition: WNL  Sleep:  Fair   Screenings: GAD-7    Flowsheet Row Office Visit from 07/21/2022 in [facility]  Total GAD-7 Score 14      PHQ2-9    Flowsheet Row Office Visit from 09/12/2022 in [facility] Office Visit from 07/21/2022 in [facility]  PHQ-2 Total Score 5 3  PHQ-9 Total Score 17 11      Flowsheet Row Admission (Discharged) from 01/11/2023 in [facility] Admission (Discharged) from 11/14/2022 in [facility]  C-SSRS RISK CATEGORY No Risk No Risk        Assessment and Plan:  Korinna Tat is a 68 y.o. year old female with a history of depression, anxiety, seizure like activity, sleep disturbance, who presents for follow up appointment for below.   1. MDD (major depressive disorder), recurrent, in partial remission (HCC) 2. 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  Although there was concern of worsening in tremors on higher dose of  fluoxetine, she has not noticed any difference since tried discontinuation of the medication.  She is willing to try higher dose again to optimize treatment for anxiety given she has chronic anxiety with significant muscle tension.  Although she will greatly benefit from CBT, she is not able  to afford this.   # mild cognitive disorder "04/01/2022 CBC, CMP, TSH, Vit B12, Vit B1, Vit D, folate, Treponema Pallidum (syphilis) screening cascade.  ATN profile (labcorp number [id number]) - this includes Beta-Amyloid 42/40 ratio, Phosphorylated Tau 181 - pTau181 - both assess level of pathological change associated with Alzheimer's Disease, serum Neurofilament Light chain protein assess disease severity by measuring neurodegeneration).  APO Alzheimer's Risk Verdell Carmine [phone]) - E3/E3"  Neuropsych 06/2022- did not meet criteria for neurocognitive disorder.  Unchanged. She is under the care of neurologist. The message was sent to inquire whether she would be suitable for lecanemab, acknowledging its risks, particularly considering her seizure disorder. We have not heard back from the provider.    # Insomnia Unchanged. She reports fair sleep, although she used to sleep better when she was on sertraline.  She has been on Ambien, long-term use, and she reportedly has history of insomnia for many years.  Will continue to work on sleep hygiene.  The hope is that the above intervention for anxiety was added insomnia to some extent.    Plan Increase fluoxetine 40 mg daily Next appointment:  5/14 at 9 am, video - on Ambien 5 mg qhsprn, prescribed by neurology - on lamotrigine 200 mg bid for seizure   Past trials- sertraline (alexithymia, drowsiness)   The patient demonstrates the following risk factors for suicide: Chronic risk factors for suicide include: psychiatric disorder of anxiety . Acute risk factors for suicide include: unemployment and loss (financial, interpersonal, professional). Protective  factors for this patient include: positive social support, coping skills, and hope for the future. Considering these factors, the overall suicide risk at this point appears to be low. Patient is appropriate for outpatient follow up.   A total of 30 minutes was spent on the following activities during the encounter date, which includes but is not limited to: preparing to see the patient (e.g., reviewing tests and records), obtaining and/or reviewing separately obtained history, performing a medically necessary examination or evaluation, counseling and educating the patient, family, or caregiver, ordering medications, tests, or procedures, referring and communicating with other healthcare professionals (when not reported separately), documenting clinical information in the electronic or paper health record, independently interpreting test or lab results and communicating these results to the family or caregiver, and coordinating care (when not reported separately).     Collaboration of Care: Collaboration of Care: Other reviewed notes in Epic  Patient/Guardian was advised Release of Information must be obtained prior to any record release in order to collaborate their care with an outside provider. Patient/Guardian was advised if they have not already done so to contact the registration department to sign all necessary forms in order for [place] to release information regarding their care.   A total of 30 minutes was spent on the following activities during the encounter date, which includes but is not limited to: preparing to see the patient (e.g., reviewing tests and records), obtaining and/or reviewing separately obtained history, performing a medically necessary examination or evaluation, counseling and educating the patient, family, or caregiver, ordering medications, tests, or procedures, referring and communicating with other healthcare professionals (when not reported separately), documenting clinical  information in the electronic or paper health record, independently interpreting test or lab results and communicating these results to the family or caregiver, and coordinating care (when not reported separately).   Consent: Patient/Guardian gives verbal consent for treatment and assignment of benefits for services provided during this visit. Patient/Guardian expressed understanding and agreed  to proceed.    Frances Hotter, MD 06/21/2023, 12:11 PM

## 2023-06-21 ENCOUNTER — Encounter: Payer: Self-pay | Admitting: Psychiatry

## 2023-06-21 ENCOUNTER — Telehealth (INDEPENDENT_AMBULATORY_CARE_PROVIDER_SITE_OTHER): Payer: Medicare Other | Admitting: Psychiatry

## 2023-06-21 DIAGNOSIS — F419 Anxiety disorder, unspecified: Secondary | ICD-10-CM

## 2023-06-21 DIAGNOSIS — F3341 Major depressive disorder, recurrent, in partial remission: Secondary | ICD-10-CM | POA: Diagnosis not present

## 2023-06-21 MED ORDER — FLUOXETINE HCL 40 MG PO CAPS: 40.0000 mg | ORAL_CAPSULE | Freq: Every day | ORAL | 2 refills | Status: DC

## 2023-06-21 NOTE — Patient Instructions (Signed)
Increase fluoxetine 40 mg daily Next appointment:  5/14 at 9 am

## 2023-07-03 ENCOUNTER — Encounter (HOSPITAL_COMMUNITY): Payer: Medicare Other

## 2023-07-03 ENCOUNTER — Other Ambulatory Visit: Payer: Self-pay

## 2023-07-03 ENCOUNTER — Inpatient Hospital Stay (HOSPITAL_COMMUNITY)
Admission: RE | Admit: 2023-07-03 | Discharge: 2023-07-06 | DRG: 093 | Disposition: A | Discharge status: Home / Self Care | Payer: Medicare Other | Attending: Neurology | Admitting: Neurology

## 2023-07-03 ENCOUNTER — Inpatient Hospital Stay (HOSPITAL_COMMUNITY)

## 2023-07-03 ENCOUNTER — Encounter (HOSPITAL_COMMUNITY): Payer: Self-pay | Admitting: Neurology

## 2023-07-03 DIAGNOSIS — G4700 Insomnia, unspecified: Secondary | ICD-10-CM | POA: Diagnosis present

## 2023-07-03 DIAGNOSIS — Z803 Family history of malignant neoplasm of breast: Secondary | ICD-10-CM | POA: Diagnosis not present

## 2023-07-03 DIAGNOSIS — R413 Other amnesia: Secondary | ICD-10-CM | POA: Diagnosis present

## 2023-07-03 DIAGNOSIS — Z8616 Personal history of COVID-19: Secondary | ICD-10-CM | POA: Diagnosis not present

## 2023-07-03 DIAGNOSIS — Z801 Family history of malignant neoplasm of trachea, bronchus and lung: Secondary | ICD-10-CM

## 2023-07-03 DIAGNOSIS — G2581 Restless legs syndrome: Secondary | ICD-10-CM | POA: Diagnosis present

## 2023-07-03 DIAGNOSIS — F32A Depression, unspecified: Secondary | ICD-10-CM | POA: Diagnosis present

## 2023-07-03 DIAGNOSIS — Z79899 Other long term (current) drug therapy: Secondary | ICD-10-CM | POA: Diagnosis not present

## 2023-07-03 DIAGNOSIS — R569 Unspecified convulsions: Secondary | ICD-10-CM | POA: Diagnosis present

## 2023-07-03 DIAGNOSIS — F419 Anxiety disorder, unspecified: Secondary | ICD-10-CM | POA: Diagnosis present

## 2023-07-03 DIAGNOSIS — Z7982 Long term (current) use of aspirin: Secondary | ICD-10-CM

## 2023-07-03 DIAGNOSIS — G252 Other specified forms of tremor: Principal | ICD-10-CM | POA: Diagnosis present

## 2023-07-03 LAB — CBC WITH DIFFERENTIAL/PLATELET
Abs Immature Granulocytes: 0.02 10*3/uL (ref 0.00–0.07)
Basophils Absolute: 0.1 10*3/uL (ref 0.0–0.1)
Basophils Relative: 2 %
Eosinophils Absolute: 0.2 10*3/uL (ref 0.0–0.5)
Eosinophils Relative: 4 %
HCT: 37.9 % (ref 36.0–46.0)
Hemoglobin: 12.6 g/dL (ref 12.0–15.0)
Immature Granulocytes: 0 %
Lymphocytes Relative: 25 %
Lymphs Abs: 1.2 10*3/uL (ref 0.7–4.0)
MCH: 28.5 pg (ref 26.0–34.0)
MCHC: 33.2 g/dL (ref 30.0–36.0)
MCV: 85.7 fL (ref 80.0–100.0)
Monocytes Absolute: 0.6 10*3/uL (ref 0.1–1.0)
Monocytes Relative: 11 %
Neutro Abs: 2.8 10*3/uL (ref 1.7–7.7)
Neutrophils Relative %: 58 %
Platelets: 299 10*3/uL (ref 150–400)
RBC: 4.42 MIL/uL (ref 3.87–5.11)
RDW: 14.1 % (ref 11.5–15.5)
WBC: 4.9 10*3/uL (ref 4.0–10.5)
nRBC: 0 % (ref 0.0–0.2)

## 2023-07-03 LAB — COMPREHENSIVE METABOLIC PANEL
ALT: 13 U/L (ref 0–44)
AST: 26 U/L (ref 15–41)
Albumin: 3.4 g/dL — ABNORMAL LOW (ref 3.5–5.0)
Alkaline Phosphatase: 47 U/L (ref 38–126)
Anion gap: 13 (ref 5–15)
BUN: 31 mg/dL — ABNORMAL HIGH (ref 8–23)
CO2: 22 mmol/L (ref 22–32)
Calcium: 9.2 mg/dL (ref 8.9–10.3)
Chloride: 106 mmol/L (ref 98–111)
Creatinine, Ser: 1.27 mg/dL — ABNORMAL HIGH (ref 0.44–1.00)
GFR, Estimated: 46 mL/min — ABNORMAL LOW (ref >60)
Glucose, Bld: 87 mg/dL (ref 70–99)
Potassium: 4.5 mmol/L (ref 3.5–5.1)
Sodium: 141 mmol/L (ref 135–145)
Total Bilirubin: 1.2 mg/dL (ref 0.0–1.2)
Total Protein: 5.8 g/dL — ABNORMAL LOW (ref 6.5–8.1)

## 2023-07-03 LAB — RAPID URINE DRUG SCREEN, HOSP PERFORMED
Amphetamines: NOT DETECTED
Barbiturates: NOT DETECTED
Benzodiazepines: NOT DETECTED
Cocaine: NOT DETECTED
Opiates: NOT DETECTED
Tetrahydrocannabinol: NOT DETECTED

## 2023-07-03 LAB — HIV ANTIBODY (ROUTINE TESTING W REFLEX): HIV Screen 4th Generation wRfx: NONREACTIVE

## 2023-07-03 LAB — PHOSPHORUS: Phosphorus: 3.6 mg/dL (ref 2.5–4.6)

## 2023-07-03 LAB — MAGNESIUM: Magnesium: 2.1 mg/dL (ref 1.7–2.4)

## 2023-07-03 LAB — PROTIME-INR
INR: 1 (ref 0.8–1.2)
Prothrombin Time: 13.8 s (ref 11.4–15.2)

## 2023-07-03 MED ORDER — ACETAMINOPHEN 325 MG PO TABS
650.0000 mg | ORAL_TABLET | ORAL | Status: DC | PRN
  Administered 2023-07-04 – 2023-07-05 (×2): 650 mg via ORAL
  Filled 2023-07-03 (×2): qty 2

## 2023-07-03 MED ORDER — ZOLPIDEM TARTRATE 5 MG PO TABS
5.0000 mg | ORAL_TABLET | Freq: Every day | ORAL | Status: DC
  Administered 2023-07-03 – 2023-07-05 (×2): 5 mg via ORAL
  Filled 2023-07-03 (×2): qty 1

## 2023-07-03 MED ORDER — LAMOTRIGINE 100 MG PO TABS
200.0000 mg | ORAL_TABLET | Freq: Two times a day (BID) | ORAL | Status: DC
  Administered 2023-07-03 – 2023-07-06 (×6): 200 mg via ORAL
  Filled 2023-07-03 (×6): qty 2

## 2023-07-03 MED ORDER — LORAZEPAM 2 MG/ML IJ SOLN: 2.0000 mg | INTRAMUSCULAR | Status: DC | PRN

## 2023-07-03 MED ORDER — LABETALOL HCL 5 MG/ML IV SOLN: 5.0000 mg | INTRAVENOUS | Status: DC | PRN

## 2023-07-03 MED ORDER — ETODOLAC 500 MG PO TABS
500.0000 mg | ORAL_TABLET | Freq: Two times a day (BID) | ORAL | Status: DC | PRN
Start: 2023-07-03 — End: 2023-07-03

## 2023-07-03 MED ORDER — ALPRAZOLAM 0.25 MG PO TABS: 0.2500 mg | ORAL_TABLET | Freq: Every day | ORAL | Status: DC | PRN

## 2023-07-03 MED ORDER — ENOXAPARIN SODIUM 40 MG/0.4ML IJ SOSY
40.0000 mg | PREFILLED_SYRINGE | INTRAMUSCULAR | Status: DC
  Administered 2023-07-03 – 2023-07-06 (×4): 40 mg via SUBCUTANEOUS
  Filled 2023-07-03 (×4): qty 0.4

## 2023-07-03 MED ORDER — ACETAMINOPHEN 650 MG RE SUPP: 650.0000 mg | RECTAL | Status: DC | PRN

## 2023-07-03 MED ORDER — SODIUM CHLORIDE 0.9% FLUSH
3.0000 mL | Freq: Two times a day (BID) | INTRAVENOUS | Status: DC
  Administered 2023-07-03 – 2023-07-06 (×7): 3 mL via INTRAVENOUS

## 2023-07-03 MED ORDER — OSTEO BI-FLEX ADV TRIPLE ST PO TABS: 1.0000 | ORAL_TABLET | Freq: Every day | ORAL | Status: DC

## 2023-07-03 MED ORDER — FLUOXETINE HCL 20 MG PO CAPS
40.0000 mg | ORAL_CAPSULE | Freq: Every day | ORAL | Status: DC
  Administered 2023-07-04 – 2023-07-06 (×3): 40 mg via ORAL
  Filled 2023-07-03 (×3): qty 2

## 2023-07-03 MED ORDER — ASPIRIN 81 MG PO TBEC
81.0000 mg | DELAYED_RELEASE_TABLET | Freq: Every day | ORAL | Status: DC
  Administered 2023-07-04 – 2023-07-06 (×3): 81 mg via ORAL
  Filled 2023-07-03 (×3): qty 1

## 2023-07-03 MED ORDER — IBUPROFEN 200 MG PO TABS: 400.0000 mg | ORAL_TABLET | Freq: Two times a day (BID) | ORAL | Status: DC | PRN

## 2023-07-03 NOTE — Plan of Care (Signed)

## 2023-07-03 NOTE — H&P (Signed)
 CC: Seizure-like activity  History is obtained from: patient, husband at bedside chart review  HPI: Frances Gallagher is a 68 y.o. female with past medical history of anxiety, mild cognitive impairment, cardiac arrhythmias who is admitted to epilepsy monitoring unit for characterization of seizure-like activity.  Patient states pretty much her whole life she has had episodes of memory lapses which got worse around 2020.  She saw Dr. Sherryll Burger at [facility] and was diagnosed with seizures.  She was started on lamotrigine and since then these episodes of memory lapses have significantly improved.  She also states again pretty much her whole life she has had episodes of muscle cramps in inner side of thigh and perineal area which happened very frequently when she was a baby/toddler, gradually improved and were happening maybe once a year.  However in the last few years these episodes got worse and started happening on average once every day, more frequent at night, improving after sitting down in hot tub.  Once she was started on lamotrigine for the previously discussed episodes, these episodes of muscle spasms also significantly improved and have not happened in a very long time  She states she has also had tremors in both her hands pretty much her whole life.  However in the last couple of years these episodes have gotten worse and now sometimes involve whole body shaking without any alteration of awareness.  Also describes restlessness and jerking in bilateral lower extremities.  Also reports worsening falls.  These episodes have not improved on lamotrigine   Current AEDs: Lamotrigine 200 mg twice daily MRI brain without contrast 05/23/2023: No acute abnormality  ROS: All other systems reviewed and negative except as noted in the HPI.   Past Medical History:  Diagnosis Date   Anxiety    Anxiety disorder    COVID-19 12/31/2022   has had negative test since   Depression    H/O cardiac  arrhythmia    Mild cognitive impairment    PONV (postoperative nausea and vomiting)     Family History  Problem Relation Age of Onset   Lung cancer Mother    Breast cancer Maternal Aunt      Social History:  reports that she has never smoked. She has never used smokeless tobacco. She reports that she does not drink alcohol and does not use drugs.   Medications Prior to Admission  Medication Sig Dispense Refill Last Dose/Taking   ALPRAZolam (XANAX) 0.25 MG tablet Take 1 tablet by mouth as needed.      Aspirin 162.5 MG CP24 Take by mouth daily.      calcium carbonate (OSCAL) 1500 (600 Ca) MG TABS tablet Take by mouth 2 (two) times daily with a meal.      Cholecalciferol (VITAMIN D3) 50 MCG (2000 UT) capsule Take 2,000 Units by mouth daily.      cyclobenzaprine (FLEXERIL) 10 MG tablet Take 1 tablet by mouth 3 (three) times daily. (Patient not taking: Reported on 01/11/2023)      docusate sodium (COLACE) 50 MG capsule Take 50 mg by mouth 2 (two) times daily.      etodolac (LODINE) 500 MG tablet Take 1 tablet by mouth daily. (Patient not taking: Reported on 01/11/2023)      FLUoxetine (PROZAC) 40 MG capsule Take 1 capsule (40 mg total) by mouth daily. 30 capsule 2    Glucosamine-Chondroitin (OSTEO BI-FLEX REGULAR STRENGTH PO) Take 2 tablets by mouth daily.      lamoTRIgine (LAMICTAL) 200 MG  tablet Take 200 mg by mouth 2 (two) times daily.      magnesium gluconate (MAGONATE) 500 MG tablet Take 500 mg by mouth 2 (two) times daily.      Na Sulfate-K Sulfate-Mg Sulf 17.5-3.13-1.6 GM/177ML SOLN SMARTSIG:Bottle(s) By Mouth As Directed      vitamin B-12 (CYANOCOBALAMIN) 1000 MCG tablet Take 1 tablet by mouth 1 day or 1 dose. (Patient not taking: Reported on 01/09/2023)      zolpidem (AMBIEN) 10 MG tablet Take 5 mg by mouth at bedtime.         Exam: Current vital signs: BP 130/66 (BP Location: Right Arm)   Pulse (!) 58   Temp 97.6 F (36.4 C) (Oral)   SpO2 99%  Vital signs in last 24  hours: Temp:  [97.6 F (36.4 C)] 97.6 F (36.4 C) (03/03 0851) Pulse Rate:  [58] 58 (03/03 0851) BP: (130)/(66) 130/66 (03/03 0851) SpO2:  [99 %] 99 % (03/03 0851)   Physical Exam  Constitutional: Appears well-developed and well-nourished.  Psych: Affect appropriate to situation Neuro: AOx3, no aphasia, cranial nerves grossly intact, 5/5 in all 4 extremities, sensory intact to light touch   I have reviewed labs in epic and the results pertinent to this consultation are: CBC: No results for input(s): "WBC", "NEUTROABS", "HGB", "HCT", "MCV", "PLT" in the last 168 hours.  Basic Metabolic Panel:  Lab Results  Component Value Date   NA 139 01/21/2020   K 3.9 01/21/2020   CO2 20 (L) 01/21/2020   GLUCOSE 176 (H) 01/21/2020   BUN 25 (H) 01/21/2020   CREATININE 1.50 (H) 08/09/2021   CALCIUM 10.0 01/21/2020   GFRNONAA 43 (L) 01/21/2020   GFRAA 49 (L) 01/21/2020   Lipid Panel: No results found for: "LDLCALC" HgbA1c: No results found for: "HGBA1C" Urine Drug Screen: No results found for: "LABOPIA", "COCAINSCRNUR", "LABBENZ", "AMPHETMU", "THCU", "LABBARB"  Alcohol Level No results found for: "ETH"   I have reviewed the images obtained:  MRI Brain without contrast 05/23/2023: No acute abnormality.  No encephalomalacia.  ASSESSMENT/PLAN: 68 year old female admitted to epilepsy monitoring unit for characterization of seizure-like activity  Transient alteration of awareness Seizure-like activity Tremors -Start video EEG monitoring for characterization of spells -Continue lamotrigine 200 mg twice daily -Will plan for HV and photic stimulation tomorrow -Continue seizure precautions -As needed IV Ativan for seizures  Depression/anxiety - Continue fluoxetine -Continue home Xanax as needed  Insomnia -Continue home Ambien   Arvis Miguez Epilepsy [facility]

## 2023-07-03 NOTE — Progress Notes (Signed)
 vLTM setup  All impedances below 10kohms.  [facility] monitoring.  Pt event button tested.  No initial skin breakdown noted

## 2023-07-04 ENCOUNTER — Inpatient Hospital Stay (HOSPITAL_COMMUNITY)

## 2023-07-04 DIAGNOSIS — F419 Anxiety disorder, unspecified: Secondary | ICD-10-CM | POA: Diagnosis not present

## 2023-07-04 DIAGNOSIS — R569 Unspecified convulsions: Secondary | ICD-10-CM | POA: Diagnosis not present

## 2023-07-04 DIAGNOSIS — G4700 Insomnia, unspecified: Secondary | ICD-10-CM | POA: Diagnosis not present

## 2023-07-04 DIAGNOSIS — F32A Depression, unspecified: Secondary | ICD-10-CM | POA: Diagnosis not present

## 2023-07-04 NOTE — Procedures (Signed)
 Patient Name: Calyssa Zobrist  MRN: [id number]  Epilepsy Attending: Charlsie Quest  Referring Physician/Provider: Charlsie Quest, MD  Duration: 07/03/2023 1042 to 07/04/2023 1042  Patient history: 68 year old female admitted to epilepsy monitoring unit for characterization of seizure-like activity. EEG to evaluate for seizure  Level of alertness: Awake, asleep  AEDs during EEG study: LTG  Technical aspects: This EEG study was done with scalp electrodes positioned according to the 10-20 International system of electrode placement. Electrical activity was reviewed with band pass filter of 1-70Hz , sensitivity of 7 uV/mm, display speed of 39mm/sec with a 60Hz  notched filter applied as appropriate. EEG data were recorded continuously and digitally stored.  Video monitoring was available and reviewed as appropriate.  Description: The posterior dominant rhythm consists of 8-9 Hz activity of moderate voltage (25-35 uV) seen predominantly in posterior head regions, symmetric and reactive to eye opening and eye closing. Sleep was characterized by vertex waves, sleep spindles (12 to 14 Hz), maximal frontocentral region.  Hyperventilation did not show any EEG change.  Physiologic photic driving was seen during photic stimulation.    IMPRESSION: This study is within normal limits. No seizures or epileptiform discharges were seen throughout the recording.  A normal interictal EEG does not exclude the diagnosis of epilepsy.  Lucky Trotta Annabelle Harman

## 2023-07-04 NOTE — Progress Notes (Signed)
vLTM maintenance  All impedances below 10k.  No skin breakdown noted at FP1  FP2  A1  A2

## 2023-07-04 NOTE — TOC CM/SW Note (Signed)
 Transition of Care [facility]) - Inpatient Brief Assessment   Patient Details  Name: Frances Gallagher MRN: [id number] Date of Birth: 1955-09-14  Transition of Care [facility]) CM/SW Contact:    Kermit Balo, RN Phone Number: 07/04/2023, 10:19 AM   Clinical Narrative:  Admitted to EMU   Transition of Care Asessment: Insurance and Status: Insurance coverage has been reviewed Patient has primary care physician: Yes Home environment has been reviewed: home with spouse   Prior/Current Home Services: No current home services Social Drivers of Health Review: SDOH reviewed no interventions necessary Readmission risk has been reviewed: Yes Transition of care needs: no transition of care needs at this time

## 2023-07-04 NOTE — Progress Notes (Signed)
 Subjective: No acute events overnight.  No new concerns.  ROS: negative except above  Examination  Vital signs in last 24 hours: Temp:  [97.5 F (36.4 C)-98 F (36.7 C)] 97.5 F (36.4 C) (03/04 0926) Pulse Rate:  [49-52] 49 (03/04 0352) Resp:  [16-18] 18 (03/04 0352) BP: (116-134)/(61-69) 120/67 (03/04 0926) SpO2:  [97 %-100 %] 99 % (03/04 0926) Weight:  [61.2 kg] 61.2 kg (03/03 1517)  General: Sitting in bed, NAD Neuro: MS: Alert, oriented, follows commands CN: pupils equal and reactive,  EOMI, face symmetric, tongue midline, normal sensation over face, Motor: 5/5 strength in all 4 extremities Coordination: normal Gait: not tested  Basic Metabolic Panel: Recent Labs  Lab 07/03/23 0944  NA 141  K 4.5  CL 106  CO2 22  GLUCOSE 87  BUN 31*  CREATININE 1.27*  CALCIUM 9.2  MG 2.1  PHOS 3.6    CBC: Recent Labs  Lab 07/03/23 0944  WBC 4.9  NEUTROABS 2.8  HGB 12.6  HCT 37.9  MCV 85.7  PLT 299     Coagulation Studies: Recent Labs    07/03/23 0944  LABPROT 13.8  INR 1.0    Imaging No new brain imaging overnight  ASSESSMENT AND PLAN: 68 year old female admitted to epilepsy monitoring unit for characterization of seizure-like activity   Transient alteration of awareness Seizure-like activity Tremors -Continue video EEG monitoring for characterization of spells -Continue lamotrigine 200 mg twice daily -HV, photic stimulation and sleep deprivation tonight  -Continue seizure precautions -As needed IV Ativan for seizures   Depression/anxiety - Continue fluoxetine -Continue home Xanax as needed   Insomnia -Continue home Ambien  I have spent a total of 36   minutes with the patient reviewing hospital notes,  test results, labs and examining the patient as well as establishing an assessment and plan that was discussed personally with the patient.  > 50% of time was spent in direct patient care.        Lindie Spruce [facility] For questions after 5pm please refer to AMION to reach the Neurologist on call

## 2023-07-05 ENCOUNTER — Inpatient Hospital Stay (HOSPITAL_COMMUNITY)

## 2023-07-05 NOTE — Plan of Care (Signed)
 Pt has ongoing EMU on sleep deprivation tonight. Educated pt to stay awake and reinforced during every rounding. Pt is alert and compliant. Has been ambulating to the bathroom independently after set up. No new symptoms noted thus far. Will continue to monitor.   Problem: Safety: Goal: Ability to remain free from injury will improve Outcome: Progressing   Problem: Education: Goal: Expressions of having a comfortable level of knowledge regarding the disease process will increase Outcome: Progressing   Problem: Coping: Goal: Ability to adjust to condition or change in health will improve Outcome: Progressing   Problem: Health Behavior/Discharge Planning: Goal: Compliance with prescribed medication regimen will improve Outcome: Progressing   Problem: Clinical Measurements: Goal: Complications related to the disease process, condition or treatment will be avoided or minimized Outcome: Progressing Goal: Diagnostic test results will improve Outcome: Progressing   Problem: Self-Concept: Goal: Ability to verbalize feelings about condition will improve Outcome: Progressing

## 2023-07-05 NOTE — Progress Notes (Signed)
vLTM maintenance  All impedances below 10k.  No skin breakdown noted at FP1  FP2  A1  A2

## 2023-07-05 NOTE — Procedures (Signed)
 Patient Name: Frances Gallagher  MRN: [id number]  Epilepsy Attending: Charlsie Quest  Referring Physician/Provider: Charlsie Quest, MD  Duration: 07/04/2023 1042 to 07/05/2023 1042   Patient history: 68 year old female admitted to epilepsy monitoring unit for characterization of seizure-like activity. EEG to evaluate for seizure   Level of alertness: Awake, asleep   AEDs during EEG study: LTG   Technical aspects: This EEG study was done with scalp electrodes positioned according to the 10-20 International system of electrode placement. Electrical activity was reviewed with band pass filter of 1-70Hz , sensitivity of 7 uV/mm, display speed of 65mm/sec with a 60Hz  notched filter applied as appropriate. EEG data were recorded continuously and digitally stored.  Video monitoring was available and reviewed as appropriate.   Description: The posterior dominant rhythm consists of 8-9 Hz activity of moderate voltage (25-35 uV) seen predominantly in posterior head regions, symmetric and reactive to eye opening and eye closing. Sleep was characterized by vertex waves, sleep spindles (12 to 14 Hz), maximal frontocentral region.     IMPRESSION: This study is within normal limits. No seizures or epileptiform discharges were seen throughout the recording.   A normal interictal EEG does not exclude the diagnosis of epilepsy.   [name]

## 2023-07-06 ENCOUNTER — Other Ambulatory Visit (HOSPITAL_COMMUNITY): Payer: Self-pay

## 2023-07-06 ENCOUNTER — Inpatient Hospital Stay (HOSPITAL_COMMUNITY)

## 2023-07-06 DIAGNOSIS — R569 Unspecified convulsions: Secondary | ICD-10-CM | POA: Diagnosis not present

## 2023-07-06 MED ORDER — GABAPENTIN 100 MG PO CAPS
100.0000 mg | ORAL_CAPSULE | Freq: Every day | ORAL | 2 refills | Status: AC
  Filled 2023-07-06: qty 30, 30d supply, fill #0

## 2023-07-06 NOTE — Procedures (Addendum)
 Patient Name: Frances Gallagher  MRN: [id number]  Epilepsy Attending: Charlsie Quest  Referring Physician/Provider: Charlsie Quest, MD  Duration: 07/05/2023 1042 to 07/06/2023 1018   Patient history: 68 year old female admitted to epilepsy monitoring unit for characterization of seizure-like activity. EEG to evaluate for seizure   Level of alertness: Awake, asleep   AEDs during EEG study: LTG   Technical aspects: This EEG study was done with scalp electrodes positioned according to the 10-20 International system of electrode placement. Electrical activity was reviewed with band pass filter of 1-70Hz , sensitivity of 7 uV/mm, display speed of 74mm/sec with a 60Hz  notched filter applied as appropriate. EEG data were recorded continuously and digitally stored.  Video monitoring was available and reviewed as appropriate.   Description: The posterior dominant rhythm consists of 8-9 Hz activity of moderate voltage (25-35 uV) seen predominantly in posterior head regions, symmetric and reactive to eye opening and eye closing. Sleep was characterized by vertex waves, sleep spindles (12 to 14 Hz), maximal frontocentral region.     IMPRESSION: This study is within normal limits. No seizures or epileptiform discharges were seen throughout the recording.   A normal interictal EEG does not exclude the diagnosis of epilepsy.   Frances Gallagher

## 2023-07-06 NOTE — Progress Notes (Signed)
 EMU DC complete

## 2023-07-06 NOTE — Discharge Summary (Addendum)
 Physician Discharge Summary  Patient ID: Frances Gallagher MRN: [id number] DOB/AGE: 12/18/1955 68 y.o.  Admit date: 07/03/2023 Discharge date: 07/06/2023  Admission Diagnoses: seizure  Discharge Diagnoses: Transient tremors  Discharged Condition: stable  Hospital Course: Frances Gallagher was admitted to epilepsy monitoring unit between 07/03/2023 to 07/06/2023.  During this time, she underwent continuous video EEG monitoring.  Hyperventilation, photic stimulation and sleep deprivation were performed.  Patient reported having subtle arm/hand tremors but nothing as generalized as typically happens at home.  Her EEG was within normal limits.  These episodes are most likely not epileptic.  She did report significant trouble sleeping and restless leg so starting gabapentin 100mg  daily at night. States her major concern was episodes of memory loss and does have completely resolved since being on lamotrigine.  Recommend continuing lamotrigine 200 mg twice daily and continue to follow-up with [facility].  Continue seizure precautions.   Consults: None  Significant Diagnostic Studies: Video EEG  Description: The posterior dominant rhythm consists of 8-9 Hz activity of moderate voltage (25-35 uV) seen predominantly in posterior head regions, symmetric and reactive to eye opening and eye closing. Sleep was characterized by vertex waves, sleep spindles (12 to 14 Hz), maximal frontocentral region.  Hyperventilation did not show any EEG change.  Physiologic photic driving was seen during photic stimulation.     IMPRESSION: This study is within normal limits. No seizures or epileptiform discharges were seen throughout the recording.   A normal interictal EEG does not exclude the diagnosis of epilepsy.  Treatments: Continue lamotrigine 200 mg twice daily, start gabapentin 100mg  QHS  Discharge Exam: Blood pressure 106/61, pulse (!) 51, temperature 97.6 F (36.4 C), temperature source Oral, resp. rate 14,  height 5\' 4"  (1.626 m), weight 61.2 kg, SpO2 99%.  General: Sitting in bed, NAD Neuro: Frances: Alert, oriented, follows commands CN: pupils equal and reactive,  EOMI, face symmetric, tongue midline, normal sensation over face, Motor: 5/5 strength in all 4 extremities Coordination: normal Gait: not tested  Discharge disposition: 01-Home or Self Care   Discharge Instructions     Call MD for:   Complete by: As directed    If patient has another seizure, call 911 and bring them back to the ED if: A.  The seizure lasts longer than 5 minutes.      B.  The patient doesn't wake shortly after the seizure or has new problems such as difficulty seeing, speaking or moving following the seizure C.  The patient was injured during the seizure D.  The patient has a temperature over 102 F (39C) E.  The patient vomited during the seizure and now is having trouble breathing      Diet - low sodium heart healthy   Complete by: As directed    Increase activity slowly   Complete by: As directed    Other Restrictions   Complete by: As directed    Seizure precautions: Per [facility] statutes, patients with seizures are not allowed to drive until they have been seizure-free for six months and cleared by a physician    Use caution when using heavy equipment or power tools. Avoid working on ladders or at heights. Take showers instead of baths. Ensure the water temperature is not too high on the home water heater. Do not go swimming alone. Do not lock yourself in a room alone (i.e. bathroom). When caring for infants or small children, sit down when holding, feeding, or changing them to minimize risk of injury  to the child in the event you have a seizure. Maintain good sleep hygiene. Avoid alcohol.      Allergies as of 07/06/2023   No Known Allergies      Medication List     TAKE these medications    acetaminophen 500 MG tablet Commonly known as: TYLENOL Take 500-1,000 mg by mouth 2 (two) times  daily as needed for moderate pain (pain score 4-6), headache or fever.   ALPRAZolam 0.25 MG tablet Commonly known as: XANAX Take 0.25 mg by mouth daily as needed for anxiety or sleep.   aspirin EC 81 MG tablet Take 81 mg by mouth daily.   Caltrate 600+D Plus Minerals 600-800 MG-UNIT Tabs Take 2 tablets by mouth 2 (two) times daily.   cyclobenzaprine 10 MG tablet Commonly known as: FLEXERIL Take 10 mg by mouth daily as needed for muscle spasms.   etodolac 500 MG tablet Commonly known as: LODINE Take 500 mg by mouth 2 (two) times daily as needed (pain).   FLUoxetine 40 MG capsule Commonly known as: PROZAC Take 1 capsule (40 mg total) by mouth daily.   gabapentin 100 MG capsule Commonly known as: Neurontin Take 1 capsule (100 mg total) by mouth daily.   lamoTRIgine 200 MG tablet Commonly known as: LAMICTAL Take 200 mg by mouth 2 (two) times daily.   Magnesium 200 MG Tabs Take 400 mg by mouth daily. Magnesium glycinate 200mg  capsules   Osteo Bi-Flex Adv Triple St Tabs Take 1 tablet by mouth daily.   Vitamin D3 50 MCG (2000 UT) capsule Take 4,000 Units by mouth at bedtime.   zolpidem 10 MG tablet Commonly known as: AMBIEN Take 5-10 mg by mouth at bedtime.        During the Seizure   - First, ensure adequate ventilation and place patients on the floor on their left side  Loosen clothing around the neck and ensure the airway is patent. If the patient is clenching the teeth, do not force the mouth open with any object as this can cause severe damage - Remove all items from the surrounding that can be hazardous. The patient may be oblivious to what's happening and may not even know what he or she is doing. If the patient is confused and wandering, either gently guide him/her away and block access to outside areas - Reassure the individual and be comforting - Call 911. In most cases, the seizure ends before EMS arrives. However, there are cases when seizures may last  over 3 to 5 minutes. Or the individual may have developed breathing difficulties or severe injuries. If a pregnant patient or a person with diabetes develops a seizure, it is prudent to call an ambulance. - Finally, if the patient does not regain full consciousness, then call EMS. Most patients will remain confused for about 45 to 90 minutes after a seizure, so you must use judgment in calling for help. - Avoid restraints but make sure the patient is in a bed with padded side rails - Place the individual in a lateral position with the neck slightly flexed; this will help the saliva drain from the mouth and prevent the tongue from falling backward - Remove all nearby furniture and other hazards from the area - Provide verbal assurance as the individual is regaining consciousness - Provide the patient with privacy if possible - Call for help and start treatment as ordered by the caregiver    After the Seizure (Postictal Stage)   After a seizure, most  patients experience confusion, fatigue, muscle pain and/or a headache. Thus, one should permit the individual to sleep. For the next few days, reassurance is essential. Being calm and helping reorient the person is also of importance.   Most seizures are painless and end spontaneously. Seizures are not harmful to others but can lead to complications such as stress on the lungs, brain and the heart. Individuals with prior lung problems may develop labored breathing and respiratory distress.   I have spent a total of  32  minutes with the patient reviewing hospital notes,  test results, labs and examining the patient as well as establishing an assessment and plan that was discussed personally with the patient.  > 50% of time was spent in direct patient care.        Signed: Charlsie Quest 07/06/2023, 10:24 AM

## 2023-07-06 NOTE — TOC Transition Note (Signed)
 Transition of Care [facility]) - Discharge Note   Patient Details  Name: Frances Gallagher MRN: [id number] Date of Birth: 1955/08/16  Transition of Care [facility]) CM/SW Contact:  Kermit Balo, RN Phone Number: 07/06/2023, 10:22 AM   Clinical Narrative:    Pt is discharging home with self care. No needs per TOC. Pt has transportation home.   Final next level of care: Home/Self Care Barriers to Discharge: No Barriers Identified   Patient Goals and CMS Choice            Discharge Placement                       Discharge Plan and Services Additional resources added to the After Visit Summary for                                       Social Drivers of Health (SDOH) Interventions SDOH Screenings   Food Insecurity: No Food Insecurity (07/03/2023)  Housing: Low Risk  (07/03/2023)  Transportation Needs: No Transportation Needs (07/03/2023)  Utilities: Not At Risk (07/03/2023)  Depression (PHQ2-9): High Risk (09/12/2022)  Financial Resource Strain: Low Risk  (05/03/2023)   Received from [facility]  Social Connections: Moderately Integrated (07/03/2023)  Tobacco Use: Low Risk  (07/03/2023)     Readmission Risk Interventions     No data to display

## 2023-07-06 NOTE — Discharge Instructions (Addendum)
 You were admitted to epilepsy monitoring unit between 07/03/2023 to 07/06/2023.  During this time, you underwent continuous video EEG monitoring.  Hyperventilation, photic stimulation and sleep deprivation were performed.  You reported subtle arm/hand tremors were nothing as significant as what happened at home.  Your EEG was within normal limits.  These events are most likely not epileptic.  She did report significant trouble sleeping and restless leg so starting gabapentin 100mg  daily at night. Please continue her lamotrigine and follow-up with [facility] clinic.  Continue seizure precautions.

## 2023-07-06 NOTE — Progress Notes (Addendum)
 EMU LTM maint complete - no skin breakdown under: A2 Performed by EEG [employer] Pinkos

## 2023-07-06 NOTE — Progress Notes (Signed)
 Subjective: No acute events overnight.  States she still has mild tremors but nothing as severe as she sometimes notices at home.  States she does not think these are epileptic.   ROS: negative except above  Examination  Vital signs in last 24 hours: Temp:  [97.5 F (36.4 C)-98.4 F (36.9 C)] 97.6 F (36.4 C) (03/06 0731) Pulse Rate:  [45-59] 51 (03/06 0731) Resp:  [14-17] 14 (03/06 0731) BP: (104-120)/(55-89) 106/61 (03/06 0731) SpO2:  [97 %-99 %] 99 % (03/06 0731)  General: Sitting in bed, NAD Neuro: MS: Alert, oriented, follows commands CN: pupils equal and reactive,  EOMI, face symmetric, tongue midline, normal sensation over face, Motor: 5/5 strength in all 4 extremities Coordination: normal Gait: not tested  Basic Metabolic Panel: Recent Labs  Lab 07/03/23 0944  NA 141  K 4.5  CL 106  CO2 22  GLUCOSE 87  BUN 31*  CREATININE 1.27*  CALCIUM 9.2  MG 2.1  PHOS 3.6    CBC: Recent Labs  Lab 07/03/23 0944  WBC 4.9  NEUTROABS 2.8  HGB 12.6  HCT 37.9  MCV 85.7  PLT 299     Coagulation Studies: No results for input(s): "LABPROT", "INR" in the last 72 hours.  Imaging No new brain imaging overnight   ASSESSMENT AND PLAN: 68 year old female admitted to epilepsy monitoring unit for characterization of seizure-like activity   Transient alteration of awareness Seizure-like activity Tremors -Continue video EEG monitoring for characterization of spells -Continue lamotrigine 200 mg twice daily -Discussed overnight EEG findings -Continue seizure precautions -As needed IV Ativan for seizures   Depression/anxiety - Continue fluoxetine -Continue home Xanax as needed   Insomnia -Continue home Ambien   I have spent a total of 35  minutes with the patient reviewing hospital notes,  test results, labs and examining the patient as well as establishing an assessment and plan that was discussed personally with the patient.  > 50% of time was spent in direct  patient care.          Lindie Spruce [facility] For questions after 5pm please refer to AMION to reach the Neurologist on call

## 2023-07-06 NOTE — Plan of Care (Signed)
 Pt still has ongoing EMU. Alert and ambulating to the bathroom with standby assist. Seizure and fall precautions reiterated, side rails kept padded, suction and oxygen set-up in place. Pt requested to get sleeping meds earlier so she can get some sleep. Will continue to monitor pt.  Problem: Clinical Measurements: Goal: Ability to maintain clinical measurements within normal limits will improve Outcome: Progressing   Problem: Pain Managment: Goal: General experience of comfort will improve and/or be controlled Outcome: Progressing   Problem: Health Behavior/Discharge Planning: Goal: Compliance with prescribed medication regimen will improve Outcome: Progressing   Problem: Medication: Goal: Risk for medication side effects will decrease Outcome: Progressing   Problem: Safety: Goal: Verbalization of understanding the information provided will improve Outcome: Progressing   Problem: Self-Concept: Goal: Level of anxiety will decrease Outcome: Progressing Goal: Ability to verbalize feelings about condition will improve Outcome: Progressing

## 2023-09-07 ENCOUNTER — Telehealth: Payer: Self-pay | Admitting: Psychiatry

## 2023-09-07 NOTE — Telephone Encounter (Signed)
 I understand. Please advise her to contact her primary care provider to ensure continuation of her medication, unless she no longer plans to follow up with them.

## 2023-09-07 NOTE — Telephone Encounter (Signed)
 Patient called to cancel her appointment for 09-13-23. States her husband is very sick with heart condition and is cancelling all of her appointments. She did not want to reschedule but states she will call when she knows she can make an appointment. Patient verbalized understanding she may not be able to get refills without an appointment.

## 2023-09-08 NOTE — Telephone Encounter (Signed)
 Pt.notified

## 2023-09-13 ENCOUNTER — Telehealth: Payer: Self-pay | Admitting: Psychiatry

## 2023-09-17 ENCOUNTER — Other Ambulatory Visit: Payer: Self-pay | Admitting: Psychiatry

## 2023-09-20 ENCOUNTER — Other Ambulatory Visit: Payer: Self-pay | Admitting: Psychiatry

## 2023-11-24 ENCOUNTER — Other Ambulatory Visit: Payer: Self-pay | Admitting: Family Medicine

## 2023-11-24 DIAGNOSIS — Z1231 Encounter for screening mammogram for malignant neoplasm of breast: Secondary | ICD-10-CM

## 2023-12-13 ENCOUNTER — Ambulatory Visit
Admission: RE | Admit: 2023-12-13 | Discharge: 2023-12-13 | Disposition: A | Discharge status: Home / Self Care | Source: Ambulatory Visit | Attending: Family Medicine | Admitting: Family Medicine

## 2023-12-13 DIAGNOSIS — Z1231 Encounter for screening mammogram for malignant neoplasm of breast: Secondary | ICD-10-CM | POA: Diagnosis present
